# Patient Record
Sex: Male | Born: 1945 | Hispanic: Refuse to answer | Marital: Married | Smoking: Former smoker
Health system: Southern US, Community
[De-identification: ages and names within clinical notes are randomized; demographics above are authoritative.]

## PROBLEM LIST (undated history)

## (undated) DIAGNOSIS — J069 Acute upper respiratory infection, unspecified: Secondary | ICD-10-CM

## (undated) DIAGNOSIS — Z860100 Personal history of colon polyps, unspecified: Secondary | ICD-10-CM

## (undated) DIAGNOSIS — I10 Essential (primary) hypertension: Secondary | ICD-10-CM

## (undated) DIAGNOSIS — M1990 Unspecified osteoarthritis, unspecified site: Secondary | ICD-10-CM

## (undated) DIAGNOSIS — E785 Hyperlipidemia, unspecified: Secondary | ICD-10-CM

## (undated) DIAGNOSIS — N200 Calculus of kidney: Secondary | ICD-10-CM

## (undated) DIAGNOSIS — Z87442 Personal history of urinary calculi: Secondary | ICD-10-CM

## (undated) DIAGNOSIS — A630 Anogenital (venereal) warts: Secondary | ICD-10-CM

## (undated) DIAGNOSIS — J309 Allergic rhinitis, unspecified: Secondary | ICD-10-CM

## (undated) HISTORY — PX: ADENOIDECTOMY: SUR15

## (undated) HISTORY — DX: Personal history of colonic polyps: Z86.010

## (undated) HISTORY — DX: Acute upper respiratory infection, unspecified: J06.9

## (undated) HISTORY — DX: Essential (primary) hypertension: I10

## (undated) HISTORY — PX: INGUINAL HERNIA REPAIR: SUR1180

## (undated) HISTORY — DX: Hyperlipidemia, unspecified: E78.5

## (undated) HISTORY — PX: TONSILLECTOMY: SUR1361

## (undated) HISTORY — DX: Anogenital (venereal) warts: A63.0

## (undated) HISTORY — DX: Personal history of colon polyps, unspecified: Z86.0100

---

## 1998-07-19 HISTORY — PX: UMBILICAL HERNIA REPAIR: SHX196

## 2000-07-19 HISTORY — PX: CHOLECYSTECTOMY: SHX55

## 2008-05-24 ENCOUNTER — Encounter: Payer: Self-pay | Admitting: Family Medicine

## 2008-11-01 ENCOUNTER — Ambulatory Visit: Payer: Self-pay | Admitting: Family Medicine

## 2008-11-01 DIAGNOSIS — H9190 Unspecified hearing loss, unspecified ear: Secondary | ICD-10-CM | POA: Insufficient documentation

## 2008-11-01 DIAGNOSIS — I10 Essential (primary) hypertension: Secondary | ICD-10-CM

## 2008-11-01 DIAGNOSIS — M719 Bursopathy, unspecified: Secondary | ICD-10-CM

## 2008-11-01 DIAGNOSIS — M67919 Unspecified disorder of synovium and tendon, unspecified shoulder: Secondary | ICD-10-CM | POA: Insufficient documentation

## 2008-11-01 DIAGNOSIS — H9319 Tinnitus, unspecified ear: Secondary | ICD-10-CM | POA: Insufficient documentation

## 2008-11-01 DIAGNOSIS — E785 Hyperlipidemia, unspecified: Secondary | ICD-10-CM

## 2008-11-05 ENCOUNTER — Encounter: Payer: Self-pay | Admitting: Family Medicine

## 2008-11-12 ENCOUNTER — Telehealth: Payer: Self-pay | Admitting: Family Medicine

## 2008-11-15 ENCOUNTER — Encounter: Payer: Self-pay | Admitting: Family Medicine

## 2009-03-19 ENCOUNTER — Ambulatory Visit: Payer: Self-pay | Admitting: Family Medicine

## 2009-03-19 DIAGNOSIS — Z9849 Cataract extraction status, unspecified eye: Secondary | ICD-10-CM | POA: Insufficient documentation

## 2009-03-19 DIAGNOSIS — L570 Actinic keratosis: Secondary | ICD-10-CM | POA: Insufficient documentation

## 2009-03-26 ENCOUNTER — Encounter: Admission: RE | Admit: 2009-03-26 | Discharge: 2009-04-21 | Payer: Self-pay | Admitting: Family Medicine

## 2009-04-18 ENCOUNTER — Encounter: Payer: Self-pay | Admitting: Family Medicine

## 2009-06-04 ENCOUNTER — Ambulatory Visit: Payer: Self-pay | Admitting: Family Medicine

## 2009-06-06 ENCOUNTER — Telehealth: Payer: Self-pay | Admitting: Family Medicine

## 2009-06-18 ENCOUNTER — Telehealth: Payer: Self-pay | Admitting: Family Medicine

## 2009-06-23 ENCOUNTER — Ambulatory Visit: Payer: Self-pay | Admitting: Family Medicine

## 2009-06-23 DIAGNOSIS — M545 Low back pain: Secondary | ICD-10-CM

## 2009-06-23 LAB — CONVERTED CEMR LAB
Bilirubin Urine: NEGATIVE
Glucose, Urine, Semiquant: NEGATIVE
Ketones, urine, test strip: NEGATIVE
Nitrite: NEGATIVE
Protein, U semiquant: NEGATIVE
Specific Gravity, Urine: 1.01
Urobilinogen, UA: 0.2
WBC Urine, dipstick: NEGATIVE
pH: 6.5

## 2009-08-26 ENCOUNTER — Ambulatory Visit: Payer: Self-pay | Admitting: Family Medicine

## 2009-08-28 LAB — CONVERTED CEMR LAB
ALT: 21 units/L (ref 0–53)
AST: 23 units/L (ref 0–37)
Albumin: 3.7 g/dL (ref 3.5–5.2)
Alkaline Phosphatase: 67 units/L (ref 39–117)
Bilirubin, Direct: 0 mg/dL (ref 0.0–0.3)
Cholesterol: 121 mg/dL (ref 0–200)
HDL: 39.8 mg/dL (ref >39.00)
LDL Cholesterol: 62 mg/dL (ref 0–99)
Total Bilirubin: 0.6 mg/dL (ref 0.3–1.2)
Total CHOL/HDL Ratio: 3
Total Protein: 6.4 g/dL (ref 6.0–8.3)
Triglycerides: 96 mg/dL (ref 0.0–149.0)
VLDL: 19.2 mg/dL (ref 0.0–40.0)

## 2009-10-03 ENCOUNTER — Ambulatory Visit: Payer: Self-pay | Admitting: Family Medicine

## 2009-10-03 LAB — CONVERTED CEMR LAB
ALT: 18 units/L (ref 0–53)
AST: 24 units/L (ref 0–37)
Albumin: 3.8 g/dL (ref 3.5–5.2)
Alkaline Phosphatase: 65 units/L (ref 39–117)
BUN: 15 mg/dL (ref 6–23)
Basophils Absolute: 0 10*3/uL (ref 0.0–0.1)
Basophils Relative: 0.8 % (ref 0.0–3.0)
Bilirubin Urine: NEGATIVE
Bilirubin, Direct: 0 mg/dL (ref 0.0–0.3)
CO2: 29 meq/L (ref 19–32)
Calcium: 8.7 mg/dL (ref 8.4–10.5)
Chloride: 110 meq/L (ref 96–112)
Cholesterol: 132 mg/dL (ref 0–200)
Creatinine, Ser: 0.9 mg/dL (ref 0.4–1.5)
Eosinophils Absolute: 0.1 10*3/uL (ref 0.0–0.7)
Eosinophils Relative: 2 % (ref 0.0–5.0)
GFR calc non Af Amer: 90.11 mL/min (ref >60)
Glucose, Bld: 86 mg/dL (ref 70–99)
Glucose, Urine, Semiquant: NEGATIVE
HCT: 41.5 % (ref 39.0–52.0)
HDL: 45.5 mg/dL (ref >39.00)
Hemoglobin: 13.6 g/dL (ref 13.0–17.0)
Ketones, urine, test strip: NEGATIVE
LDL Cholesterol: 69 mg/dL (ref 0–99)
Lymphocytes Relative: 29.3 % (ref 12.0–46.0)
Lymphs Abs: 1.2 10*3/uL (ref 0.7–4.0)
MCHC: 32.8 g/dL (ref 30.0–36.0)
MCV: 99.9 fL (ref 78.0–100.0)
Monocytes Absolute: 0.4 10*3/uL (ref 0.1–1.0)
Monocytes Relative: 8.6 % (ref 3.0–12.0)
Neutro Abs: 2.5 10*3/uL (ref 1.4–7.7)
Neutrophils Relative %: 59.3 % (ref 43.0–77.0)
Nitrite: NEGATIVE
PSA: 1.67 ng/mL (ref 0.10–4.00)
Platelets: 176 10*3/uL (ref 150.0–400.0)
Potassium: 4.2 meq/L (ref 3.5–5.1)
RBC: 4.15 M/uL — ABNORMAL LOW (ref 4.22–5.81)
RDW: 12.3 % (ref 11.5–14.6)
Sodium: 143 meq/L (ref 135–145)
Specific Gravity, Urine: >=1.03
TSH: 1.72 microintl units/mL (ref 0.35–5.50)
Total Bilirubin: 0.4 mg/dL (ref 0.3–1.2)
Total CHOL/HDL Ratio: 3
Total Protein: 6.6 g/dL (ref 6.0–8.3)
Triglycerides: 86 mg/dL (ref 0.0–149.0)
Urobilinogen, UA: 0.2
VLDL: 17.2 mg/dL (ref 0.0–40.0)
WBC Urine, dipstick: NEGATIVE
WBC: 4.2 10*3/uL — ABNORMAL LOW (ref 4.5–10.5)
pH: 5.5

## 2009-10-10 ENCOUNTER — Ambulatory Visit: Payer: Self-pay | Admitting: Family Medicine

## 2009-10-10 LAB — CONVERTED CEMR LAB
Bilirubin Urine: NEGATIVE
Glucose, Urine, Semiquant: NEGATIVE
Ketones, urine, test strip: NEGATIVE
Nitrite: NEGATIVE
Protein, U semiquant: NEGATIVE
Specific Gravity, Urine: 1.015
Urobilinogen, UA: 0.2
WBC Urine, dipstick: NEGATIVE
pH: 6.5

## 2009-10-20 ENCOUNTER — Telehealth: Payer: Self-pay | Admitting: Family Medicine

## 2009-12-22 ENCOUNTER — Telehealth: Payer: Self-pay | Admitting: Family Medicine

## 2010-01-12 ENCOUNTER — Ambulatory Visit: Payer: Self-pay | Admitting: Family Medicine

## 2010-01-12 DIAGNOSIS — M7710 Lateral epicondylitis, unspecified elbow: Secondary | ICD-10-CM | POA: Insufficient documentation

## 2010-04-09 ENCOUNTER — Ambulatory Visit: Payer: Self-pay | Admitting: Family Medicine

## 2010-04-10 LAB — CONVERTED CEMR LAB
ALT: 21 units/L (ref 0–53)
AST: 28 units/L (ref 0–37)
Albumin: 3.9 g/dL (ref 3.5–5.2)
Alkaline Phosphatase: 62 units/L (ref 39–117)
Bilirubin, Direct: 0.1 mg/dL (ref 0.0–0.3)
Cholesterol: 161 mg/dL (ref 0–200)
HDL: 38.1 mg/dL — ABNORMAL LOW (ref >39.00)
LDL Cholesterol: 107 mg/dL — ABNORMAL HIGH (ref 0–99)
Total Bilirubin: 0.5 mg/dL (ref 0.3–1.2)
Total CHOL/HDL Ratio: 4
Total Protein: 6.3 g/dL (ref 6.0–8.3)
Triglycerides: 80 mg/dL (ref 0.0–149.0)
VLDL: 16 mg/dL (ref 0.0–40.0)

## 2010-04-16 ENCOUNTER — Ambulatory Visit: Payer: Self-pay | Admitting: Family Medicine

## 2010-07-28 ENCOUNTER — Other Ambulatory Visit: Payer: Self-pay | Admitting: Family Medicine

## 2010-07-28 ENCOUNTER — Ambulatory Visit
Admission: RE | Admit: 2010-07-28 | Discharge: 2010-07-28 | Payer: Self-pay | Source: Home / Self Care | Attending: Family Medicine | Admitting: Family Medicine

## 2010-07-28 DIAGNOSIS — K5900 Constipation, unspecified: Secondary | ICD-10-CM | POA: Insufficient documentation

## 2010-07-28 LAB — TSH: TSH: 1.52 u[IU]/mL (ref 0.35–5.50)

## 2010-07-28 LAB — CBC WITH DIFFERENTIAL/PLATELET
Basophils Absolute: 0 10*3/uL (ref 0.0–0.1)
Basophils Relative: 0.6 % (ref 0.0–3.0)
Eosinophils Absolute: 0.1 10*3/uL (ref 0.0–0.7)
Eosinophils Relative: 1.4 % (ref 0.0–5.0)
HCT: 41.6 % (ref 39.0–52.0)
Hemoglobin: 14.3 g/dL (ref 13.0–17.0)
Lymphocytes Relative: 27.4 % (ref 12.0–46.0)
Lymphs Abs: 1.2 10*3/uL (ref 0.7–4.0)
MCHC: 34.4 g/dL (ref 30.0–36.0)
MCV: 97.4 fl (ref 78.0–100.0)
Monocytes Absolute: 0.3 10*3/uL (ref 0.1–1.0)
Monocytes Relative: 8.1 % (ref 3.0–12.0)
Neutro Abs: 2.6 10*3/uL (ref 1.4–7.7)
Neutrophils Relative %: 62.5 % (ref 43.0–77.0)
Platelets: 207 10*3/uL (ref 150.0–400.0)
RBC: 4.27 Mil/uL (ref 4.22–5.81)
RDW: 13.3 % (ref 11.5–14.6)
WBC: 4.2 10*3/uL — ABNORMAL LOW (ref 4.5–10.5)

## 2010-07-28 LAB — CONVERTED CEMR LAB
HDL goal, serum: 40 mg/dL
LDL Goal: 100 mg/dL

## 2010-08-18 NOTE — Progress Notes (Signed)
Summary: REQ FOR REFERRAL DOCUMENT  Phone Note Call from Patient   Caller: Patient Reason for Call: Talk to Nurse, Talk to Doctor Summary of Call: Pt called to adv that he has an appt with Dr Stephannie Li with [facility] this afternoon.... However, pt is stating that his insurance Administrator) requires him to have a referral from his PCP... Pt adv that he needs a referral for his OV with Dr Allyne Gee.... Pt can be reached at [phone] when same is ready.  Initial call taken by: Debbra Riding,  December 22, 2009 11:49 AM  Follow-up for Phone Call        OK to refer. Follow-up by: Evelena Peat MD,  December 22, 2009 1:19 PM  Additional Follow-up for Phone Call Additional follow up Details #1::        Faxed order and copy of [employer] Referral Form to Dr. Allyne Gee. Faxed Referral Form to [employer]. Pt aware. Additional Follow-up by: Corky Mull,  December 22, 2009 1:58 PM

## 2010-08-18 NOTE — Progress Notes (Signed)
Summary: Pt needs to get a referral to Opthamologist - Dr. Hazle Quant  Phone Note Call from Patient Call back at [phone] cell   Caller: Patient Summary of Call: Pt called and has checked with his insurance policy and was told that he would need to get a referral to Opthamologist - Dr. Hazle Quant of [facility]. Please give him a referral.       Initial call taken by: Lucy Antigua,  October 20, 2009 3:27 PM  Follow-up for Phone Call        OK to refer Follow-up by: Evelena Peat MD,  October 20, 2009 9:55 PM

## 2010-08-18 NOTE — Assessment & Plan Note (Signed)
Summary: 3 MONTH ROV/NJR   Vital Signs:  Patient profile:   65 year old male Weight:      230 pounds Temp:     97.8 degrees F oral BP sitting:   142 / 92  (left arm) Cuff size:   large  Vitals Entered By: Sid Falcon LPN (April 16, 2010 8:42 AM)  Serial Vital Signs/Assessments:  Time      Position  BP       Pulse  Resp  Temp     By                     140/88                         Evelena Peat MD   History of Present Illness: Patient here for followup hyperlipidemia. Also history of elevated blood pressure currently off medication. He started exercising regularly lost about 5 pounds over the past month. Blood pressures have improved about 10 points systolic and diastolic. He is exercising 5 days per week. Overall feels better.  Takes pravastatin 40 mg daily for hyperlipidemia. Recent lipids controlled but low HDL. No chest pains or dizziness.  The patient needs flu vaccine also no prior history of Pneumovax  Allergies (verified): No Known Drug Allergies  Past History:  Past Medical History: Last updated: 11/01/2008 Hayfever/allergies genital warts polyps in colon Hyperlipidemia Hypertension PMH reviewed for relevance  Physical Exam  General:  Well-developed,well-nourished,in no acute distress; alert,appropriate and cooperative throughout examination Neck:  No deformities, masses, or tenderness noted. Lungs:  Normal respiratory effort, chest expands symmetrically. Lungs are clear to auscultation, no crackles or wheezes. Heart:  Normal rate and regular rhythm. S1 and S2 normal without gallop, murmur, click, rub or other extra sounds.   Impression & Recommendations:  Problem # 1:  HYPERTENSION (ICD-401.9) marginal control.  cont weight loss efforts and reassess 3 months.  Problem # 2:  HYPERLIPIDEMIA (ICD-272.4)  His updated medication list for this problem includes:    Pravastatin Sodium 40 Mg Tabs (Pravastatin sodium) ..... One by mouth q hs  Problem  # 3:  Preventive Health Care (ICD-V70.0) flu and pvx given.  Complete Medication List: 1)  Aspirin Adult Low Strength 81 Mg Tbec (Aspirin) .... Once daily 2)  Pravastatin Sodium 40 Mg Tabs (Pravastatin sodium) .... One by mouth q hs 3)  Multi Vitamin Mens Tabs (Multiple vitamin) .... Once daily  Other Orders: Flu Vaccine 55yrs + (09604) Admin 1st Vaccine (54098) Pneumococcal Vaccine (11914) Admin of Any Addtl Vaccine (78295)  Patient Instructions: 1)  Schedule physical examination in approximately 6 months 2)  Check your  Blood Pressure regularly . If it is above: 140/90  you should make an appointment. 3)  It is important that you exercise reguarly at least 20 minutes 5 times a week. If you develop chest pain, have severe difficulty breathing, or feel very tired, stop exercising immediately and seek medical attention.    Immunizations Administered:  Influenza Vaccine # 1:    Vaccine Type: Fluvax 3+    Site: left deltoid    Mfr: GlaxoSmithKline    Dose: 0.5 ml    Route: IM    Given by: Sid Falcon LPN    Exp. Date: 01/16/2011    Lot #: [id number]    VIS given: 02/10/10 version given April 16, 2010.  Pneumonia Vaccine:    Vaccine Type: Pneumovax    Site: right deltoid  Mfr: Merck    Dose: 0.5 ml    Route: IM    Given by: Sid Falcon LPN    Exp. Date: 10/05/2011    Lot #: [id number]    VIS given: 06/23/09 version given April 16, 2010.  Flu Vaccine Consent Questions:    Do you have a history of severe allergic reactions to this vaccine? no    Any prior history of allergic reactions to egg and/or gelatin? no    Do you have a sensitivity to the preservative Thimersol? no    Do you have a past history of Guillan-Barre Syndrome? no    Do you currently have an acute febrile illness? no    Have you ever had a severe reaction to latex? no    Vaccine information given and explained to patient? yes

## 2010-08-18 NOTE — Assessment & Plan Note (Signed)
Summary: cpx/cjr   Vital Signs:  Patient profile:   65 year old male Height:      71.5 inches Weight:      241 pounds BMI:     33.26 Temp:     98.2 degrees F oral BP sitting:   140 / 94  (left arm) Cuff size:   large  Vitals Entered By: Sid Falcon LPN (October 10, 2009 8:59 AM)  Nutrition Counseling: Patient's BMI is greater than 25 and therefore counseled on weight management options. CC: CPX, labs done   History of Present Illness: Here for CPE.  Colonscsopy in 2008 due for repeat in 2013.  Tdap 2009.  Exercising regularly.  Hx hyperlipidemia  PMH, SH, AND FH reviewed and no signif changes.  Preventive Screening-Counseling & Management  Caffeine-Diet-Exercise     Does Patient Exercise: yes  Allergies (verified): No Known Drug Allergies  Past History:  Past Medical History: Last updated: 11/01/2008 Hayfever/allergies genital warts polyps in colon Hyperlipidemia Hypertension  Past Surgical History: Last updated: 11/01/2008 Hernia surgery 3 times, naval 2000, rt inguinal 2003, lt inguinal 2004  Family History: Last updated: 10/10/2009 Family History of Arthritis Family History High cholesterol Family History Hypertension breast cancer sister prostate cancer, father  Social History: Last updated: 10/10/2009 Retired Married Alcohol use-no Regular exercise-yes  Risk Factors: Exercise: yes (10/10/2009) PMH-FH-SH reviewed for relevance  Family History: Family History of Arthritis Family History High cholesterol Family History Hypertension breast cancer sister prostate cancer, father  Social History: Retired Married Alcohol use-no Regular exercise-yes Does Patient Exercise:  yes  Review of Systems  The patient denies anorexia, fever, weight loss, weight gain, chest pain, syncope, dyspnea on exertion, peripheral edema, prolonged cough, headaches, hemoptysis, abdominal pain, melena, hematochezia, severe indigestion/heartburn, hematuria,  incontinence, muscle weakness, depression, and enlarged lymph nodes.         Occ  constipation  Physical Exam  General:  Well-developed,well-nourished,in no acute distress; alert,appropriate and cooperative throughout examination Head:  Normocephalic and atraumatic without obvious abnormalities. No apparent alopecia or balding. Eyes:  No corneal or conjunctival inflammation noted. EOMI. Perrla. Funduscopic exam benign, without hemorrhages, exudates or papilledema. Vision grossly normal. Ears:  External ear exam shows no significant lesions or deformities.  Otoscopic examination reveals clear canals, tympanic membranes are intact bilaterally without bulging, retraction, inflammation or discharge. Hearing is grossly normal bilaterally. Mouth:  Oral mucosa and oropharynx without lesions or exudates.  Teeth in good repair. Neck:  No deformities, masses, or tenderness noted. Lungs:  Normal respiratory effort, chest expands symmetrically. Lungs are clear to auscultation, no crackles or wheezes. Heart:  Normal rate and regular rhythm. S1 and S2 normal without gallop, murmur, click, rub or other extra sounds. Abdomen:  Bowel sounds positive,abdomen soft and non-tender without masses, organomegaly or hernias noted. Rectal:  No external abnormalities noted. Normal sphincter tone. No rectal masses or tenderness. Prostate:  Prostate gland firm and smooth, no enlargement, nodularity, tenderness, mass, asymmetry or induration. Msk:  No deformity or scoliosis noted of thoracic or lumbar spine.   Extremities:  No clubbing, cyanosis, edema, or deformity noted with normal full range of motion of all joints.   Neurologic:  alert & oriented X3 and cranial nerves II-XII intact.   Skin:  Intact without suspicious lesions or rashes Cervical Nodes:  No lymphadenopathy noted Psych:  normally interactive, good eye contact, not anxious appearing, and not depressed appearing.     Impression &  Recommendations:  Problem # 1:  Preventive Health Care (ICD-V70.0) cont regular  exercise .  Due for repeat colonoscopy in 2 years.  Consider Shingle vaccine and pt will look at insurance coverage.  Pvx by next year.  Complete Medication List: 1)  Aspirin Adult Low Strength 81 Mg Tbec (Aspirin) .... Once daily 2)  Crestor 10 Mg Tabs (Rosuvastatin calcium) .... One daily. 3)  Multi Vitamin Mens Tabs (Multiple vitamin) .... Once daily  Patient Instructions: 1)  It is important that you exercise reguarly at least 20 minutes 5 times a week. If you develop chest pain, have severe difficulty breathing, or feel very tired, stop exercising immediately and seek medical attention.  2)  You need to lose weight. Consider a lower calorie diet and regular exercise.  3)  Consider pneumonia vaccine by next year. 4)  Consider shingles vaccine this year. Prescriptions: CRESTOR 10 MG TABS (ROSUVASTATIN CALCIUM) One daily.  #30 x 11   Entered and Authorized by:   Evelena Peat MD   Signed by:   Evelena Peat MD on 10/10/2009   Method used:   Electronically to        [facility]  [place] [address] (retail)       [address]       [place], [place]  [address]       Ph: [phone] or [phone]       Fax: [phone]   RxID:   [id number]   Laboratory Results   Urine Tests    Routine Urinalysis   Color: yellow Appearance: Clear Glucose: negative   (Normal Range: Negative) Bilirubin: negative   (Normal Range: Negative) Ketone: negative   (Normal Range: Negative) Spec. Gravity: 1.015   (Normal Range: 1.003-1.035) Blood: trace-lysed   (Normal Range: Negative) pH: 6.5   (Normal Range: 5.0-8.0) Protein: negative   (Normal Range: Negative) Urobilinogen: 0.2   (Normal Range: 0-1) Nitrite: negative   (Normal Range: Negative) Leukocyte Esterace: negative   (Normal Range: Negative)    Comments: Rita Ohara  October 10, 2009 10:02 AM

## 2010-08-18 NOTE — Assessment & Plan Note (Signed)
Summary: elbow pain/njr   Vital Signs:  Patient profile:   65 year old male Temp:     98.0 degrees F oral BP sitting:   140 / 90  (left arm) Cuff size:   large  Vitals Entered By: Sid Falcon LPN (January 12, 2010 4:00 PM) CC: bil elbow pain   History of Present Illness: Bilateral lat elbow pain for 4-5 weeks after carrying bricks. Alleve no help.  Ice without much success.  Both equally bothersome. No shoulder or wrist pain.  Also requests generic statin.  myalgias with simvastatin at 20 mg. Lipids well controlled when checked in March.  No hx of CAD.  Allergies: No Known Drug Allergies  Past History:  Past Medical History: Last updated: 11/01/2008 Hayfever/allergies genital warts polyps in colon Hyperlipidemia Hypertension  Review of Systems  The patient denies chest pain, syncope, dyspnea on exertion, and peripheral edema.    Physical Exam  General:  Well-developed,well-nourished,in no acute distress; alert,appropriate and cooperative throughout examination Neck:  No deformities, masses, or tenderness noted. Lungs:  Normal respiratory effort, chest expands symmetrically. Lungs are clear to auscultation, no crackles or wheezes. Heart:  Normal rate and regular rhythm. S1 and S2 normal without gallop, murmur, click, rub or other extra sounds. Extremities:  tender over lat epicondylar region bilaterally.  Full ROM.  Pain lat elbow with extension of wrist against resistence.     Impression & Recommendations:  Problem # 1:  LATERAL EPICONDYLITIS (ICD-726.32) Assessment New  Failed conservative therapy.  Discussed risks and benefits of corticosteroid injection and pt consents.  Prepped both elbows over lat epicondylar region and injected 1 cc plain 1%xylocaine and 40 mg depmedrol into area of maximal tenderness .  pt tolerated well.  Orders: Injection, Tendon / Ligament (16109) Depo- Medrol 40mg  (J1030)  Problem # 2:  HYPERLIPIDEMIA (ICD-272.4) change to  Pravastatin 40 mg once daily and lipid recheck 3 months. His updated medication list for this problem includes:    Pravastatin Sodium 40 Mg Tabs (Pravastatin sodium) ..... One by mouth q hs  Complete Medication List: 1)  Aspirin Adult Low Strength 81 Mg Tbec (Aspirin) .... Once daily 2)  Pravastatin Sodium 40 Mg Tabs (Pravastatin sodium) .... One by mouth q hs 3)  Multi Vitamin Mens Tabs (Multiple vitamin) .... Once daily  Patient Instructions: 1)  Please schedule a follow-up appointment in 3 months .  2)  Hepatic Panel prior to visit ICD-9: 272.4 3)  Lipid panel prior to visit ICD-9 : 272.4 4)  Continue to ice elbows frequently 5)  Pennsaid 5 drops to affected area three times daily. Prescriptions: PRAVASTATIN SODIUM 40 MG TABS (PRAVASTATIN SODIUM) one by mouth q hs  #30 x 5   Entered and Authorized by:   Evelena Peat MD   Signed by:   Evelena Peat MD on 01/12/2010   Method used:   [facility]        [facility]  [place] [address] [facility]       [address]       [place], [place]  [address]       Ph: [phone] or [phone]       Fax: [phone]   RxID:   [id number]

## 2010-08-20 NOTE — Assessment & Plan Note (Signed)
Summary: back pain/njr   Vital Signs:  Patient profile:   65 year old male Weight:      225 pounds Temp:     98.6 degrees F oral BP sitting:   150 / 90  (left arm) Cuff size:   large  Vitals Entered By: Duard Brady LPN (July 28, 2010 10:59 AM)  History of Present Illness:       This is a 65 year old male who presents with Constipation.  Onset 2 months ago.  Colonoscopy 2008 normal.  TSH normal last March.  The patient reports hard stools, but denies abdominal pain, cramping, hematochezia, melena, nausea, and vomiting.  Associated symptoms include back pain.  The patient denies the following symptoms: fever and cold intolerance.  Effective treatments have included OTC laxatives.  Ineffective treatments have included dietary changes, fiber supplements, and fluids.   Back pain seems to resolve after BMs.  Also stools seem to be shaped somewhat different-thinner.  Exercising regularly and without difficulty.  Feels well overall.  Lipid Management History:      Positive NCEP/ATP III risk factors include male age 37 years old or older, HDL cholesterol less than 40, and hypertension.  Negative NCEP/ATP III risk factors include non-diabetic, non-tobacco-user status, no ASHD (atherosclerotic heart disease), no prior stroke/TIA, no peripheral vascular disease, and no history of aortic aneurysm.     Preventive Screening-Counseling & Management  Alcohol-Tobacco     Smoking Status: never  Clinical Review Panels:  Prevention   Last Colonoscopy:  normal (07/19/2006)   Last PSA:  1.67 (10/03/2009)   Allergies (verified): No Known Drug Allergies  Past History:  Past Medical History: Last updated: 11/01/2008 Hayfever/allergies genital warts polyps in colon Hyperlipidemia Hypertension  Past Surgical History: Last updated: 11/01/2008 Hernia surgery 3 times, naval 2000, rt inguinal 2003, lt inguinal 2004  Family History: Last updated: 10/10/2009 Family History of  Arthritis Family History High cholesterol Family History Hypertension breast cancer sister prostate cancer, father  Social History: Last updated: 10/10/2009 Retired Married Alcohol use-no Regular exercise-yes  Risk Factors: Exercise: yes (10/10/2009) PMH-FH-SH reviewed for relevance  Social History: Smoking Status:  never  Review of Systems  The patient denies anorexia, fever, weight gain, chest pain, peripheral edema, prolonged cough, headaches, hemoptysis, abdominal pain, melena, hematochezia, and severe indigestion/heartburn.         has lost some weight due to his efforts.  Physical Exam  General:  Well-developed,well-nourished,in no acute distress; alert,appropriate and cooperative throughout examination Head:  Normocephalic and atraumatic without obvious abnormalities. No apparent alopecia or balding. Eyes:  pupils equal, pupils round, and pupils reactive to light.   Ears:  External ear exam shows no significant lesions or deformities.  Otoscopic examination reveals clear canals, tympanic membranes are intact bilaterally without bulging, retraction, inflammation or discharge. Hearing is grossly normal bilaterally. Mouth:  Oral mucosa and oropharynx without lesions or exudates.  Teeth in good repair. Neck:  No deformities, masses, or tenderness noted. Lungs:  Normal respiratory effort, chest expands symmetrically. Lungs are clear to auscultation, no crackles or wheezes. Heart:  Normal rate and regular rhythm. S1 and S2 normal without gallop, murmur, click, rub or other extra sounds. Abdomen:  Bowel sounds positive,abdomen soft and non-tender without masses, organomegaly or hernias noted. Genitalia:  Testes bilaterally descended without nodularity, tenderness or masses. No scrotal masses or lesions. No penis lesions or urethral discharge no hernia.  Extremities:  No clubbing, cyanosis, edema, or deformity noted with normal full range of motion of all joints.  Impression & Recommendations:  Problem # 1:  CONSTIPATION (ICD-564.00) repeat TSH and discussed conserative measures.  Avoid freq use of laxatives. Orders: TLB-TSH (Thyroid Stimulating Hormone) (84443-TSH) TLB-CBC Platelet - w/Differential (85025-CBCD)  Problem # 2:  HYPERLIPIDEMIA (ICD-272.4)  His updated medication list for this problem includes:    Pravastatin Sodium 40 Mg Tabs (Pravastatin sodium) ..... One by mouth q hs  Complete Medication List: 1)  Aspirin Adult Low Strength 81 Mg Tbec (Aspirin) .... Once daily 2)  Pravastatin Sodium 40 Mg Tabs (Pravastatin sodium) .... One by mouth q hs 3)  Multi Vitamin Mens Tabs (Multiple vitamin) .... Once daily  Lipid Assessment/Plan:      Based on NCEP/ATP III, the patient's risk factor category is "2 or more risk factors and a calculated 10 year CAD risk of > 20%".  The patient's lipid goals are as follows: Total cholesterol goal is 200; LDL cholesterol goal is 100; HDL cholesterol goal is 40; Triglyceride goal is 150.    Patient Instructions: 1)  Drink lots of fluids 2)  Fiber at least 25 grams per day. 3)  Continue regular exercise. 4)  Avoid regular use of laxatives. 5)  Stool softener such as colace or senokot S daily.   Orders Added: 1)  TLB-TSH (Thyroid Stimulating Hormone) [84443-TSH] 2)  TLB-CBC Platelet - w/Differential [85025-CBCD] 3)  Est. Patient Level IV [54098]

## 2010-10-08 ENCOUNTER — Encounter: Payer: Self-pay | Admitting: Family Medicine

## 2010-10-12 ENCOUNTER — Encounter: Payer: Self-pay | Admitting: Family Medicine

## 2010-10-12 ENCOUNTER — Ambulatory Visit (INDEPENDENT_AMBULATORY_CARE_PROVIDER_SITE_OTHER): Payer: Medicare Other | Admitting: Family Medicine

## 2010-10-12 DIAGNOSIS — N429 Disorder of prostate, unspecified: Secondary | ICD-10-CM

## 2010-10-12 DIAGNOSIS — E785 Hyperlipidemia, unspecified: Secondary | ICD-10-CM

## 2010-10-12 DIAGNOSIS — I10 Essential (primary) hypertension: Secondary | ICD-10-CM

## 2010-10-12 DIAGNOSIS — Z0000 Encounter for general adult medical examination without abnormal findings: Secondary | ICD-10-CM

## 2010-10-12 LAB — LIPID PANEL: HDL: 46 mg/dL (ref >39.00)

## 2010-10-12 LAB — PSA: PSA: 1.74 ng/mL (ref 0.10–4.00)

## 2010-10-12 NOTE — Progress Notes (Signed)
Quick Note:  Pt informed ______ 

## 2010-10-12 NOTE — Progress Notes (Signed)
Subjective:    Patient ID: Lee Smith, male    DOB: [birth year], 65 y.o.   MRN: [id number]  HPI  Patient here for annual Medicare wellness exam and reevaluation of hyperlipidemia.   Positive lifestyle changes since last visit and his exercise he has lost some weight. He took himself off Pravachol couple months ago. No intolerance to medication. No chest pain. No history of peripheral vascular disease or CAD.   1.  Risk factors based on Past Medical , Social, and Family history  PMH signif for hyperlipidemia.  Prior hx of elevated blood pressure but currently not treated. 2.  Limitations in physical activities  None.  Low risk for falls. 3.  Depression/mood  No depression or anxiety symptoms currently. 4.  Hearing  No deficits. 5.  ADLs  Fully independent in all. 6.  Cognitive function (orientation to time and place, language, writing, speech,memory)  No short or long term memory deficits.  Judgement intact.  Normal mood.   7.  Home Safety  No issues identified. 8.  Height, weight, and visual acuity.   Some weight loss due to his efforts.  No visual changes. 9.  Counseling  Tetanus up to date and prior PVX.  No hx of shingles vaccine but wife just finished chemo and he wishes to wait on that. 10. Recommendation of preventive services.  Eventually needs shingles vaccine but defer as above.  Colonoscopy will be due soon. 11. Labs based on risk factors  Lipid panel and PSA. 12. Care Plan  Labs as above.  He is instructed to continue with exercise and weigh loss efforts. 13.  Advanced Directives  None decided at this time.    Review of Systems  Constitutional: Negative for fever, activity change, appetite change and fatigue.  HENT: Negative for ear pain, congestion and trouble swallowing.   Eyes: Negative for pain and visual disturbance.  Respiratory: Negative for cough, shortness of breath and wheezing.   Cardiovascular: Negative for chest pain and palpitations.  Gastrointestinal: Negative  for nausea, vomiting, abdominal pain, diarrhea, constipation, blood in stool, abdominal distention and rectal pain.  Genitourinary: Negative for dysuria, hematuria and testicular pain.  Musculoskeletal: Negative for joint swelling and arthralgias.  Skin: Negative for rash.  Neurological: Negative for dizziness, syncope and headaches.  Hematological: Negative for adenopathy.  Psychiatric/Behavioral: Negative for confusion and dysphoric mood.       Objective:   Physical Exam  Constitutional: He is oriented to person, place, and time. He appears well-developed and well-nourished. No distress.  HENT:  Head: Normocephalic and atraumatic.  Right Ear: External ear normal.  Left Ear: External ear normal.  Mouth/Throat: Oropharynx is clear and moist.  Eyes: Conjunctivae and EOM are normal. Pupils are equal, round, and reactive to light.  Neck: Normal range of motion. Neck supple. No thyromegaly present.  Cardiovascular: Normal rate, regular rhythm and normal heart sounds.   No murmur heard. Pulmonary/Chest: No respiratory distress. He has no wheezes. He has no rales.  Abdominal: Soft. Bowel sounds are normal. He exhibits no distension and no mass. There is no tenderness. There is no rebound and no guarding.  Genitourinary: Rectum normal and prostate normal.  Musculoskeletal: He exhibits no edema.  Lymphadenopathy:    He has no cervical adenopathy.  Neurological: He is alert and oriented to person, place, and time. He displays normal reflexes. No cranial nerve deficit.  Skin: No rash noted.  Psychiatric: He has a normal mood and affect.  Assessment & Plan:   #1 health maintenance exam. Tetanus up to date. Pneumovax last fall. Discussed shingles vaccine his wife just finished chemotherapy he wishes to wait. Due for repeat colonoscopy later this year and he will contact gastroenterologist. Check labs today with PSA and lipid panel  #2 history of hyperlipidemia. Reassess lipid  panel

## 2011-06-08 ENCOUNTER — Ambulatory Visit (INDEPENDENT_AMBULATORY_CARE_PROVIDER_SITE_OTHER): Payer: Medicare Other | Admitting: Family Medicine

## 2011-06-08 ENCOUNTER — Encounter: Payer: Self-pay | Admitting: Family Medicine

## 2011-06-08 DIAGNOSIS — R1310 Dysphagia, unspecified: Secondary | ICD-10-CM

## 2011-06-08 DIAGNOSIS — Z23 Encounter for immunization: Secondary | ICD-10-CM

## 2011-06-08 DIAGNOSIS — B078 Other viral warts: Secondary | ICD-10-CM

## 2011-06-08 NOTE — Patient Instructions (Signed)
We will call you regarding gastroenterology referral.

## 2011-06-08 NOTE — Progress Notes (Signed)
  Subjective:    Patient ID: Lee Smith, male    DOB: 31-Mar-1946, 65 y.o.   MRN: [id number]  HPI  Patient seen the following issues  Progressive dysphagia over approximately 6 months. He had some mild issues in the past but recently has had few episodes of needing to regurgitate food and unable to finish meal. He has not had any unintentional weight loss or appetite changes. Dysphagia is to solids only and not to liquids. He denies previous history of EGD. Has had previous colonoscopy. Denies any vomiting or nausea.  No recent active GERD symptoms.  Remote history of smoking several years ago.  Warty lesion left lateral leg. First noted several months ago. Possibly slowly growing in size.  Requesting treatment.  Has not tried anything topically.  Past Medical History  Diagnosis Date  . Allergy   . Hypertension   . Hyperlipidemia   . Genital warts   . Hx of colonic polyps    Past Surgical History  Procedure Date  . Hernia repair     reports that he quit smoking about 40 years ago. His smoking use included Cigarettes. He has a 6 pack-year smoking history. He does not have any smokeless tobacco history on file. He reports that he does not drink alcohol. His drug history not on file. family history includes Arthritis in an unspecified family member; Cancer in his father and sister; Hyperlipidemia in an unspecified family member; and Hypertension in an unspecified family member. No Known Allergies    Review of Systems  Constitutional: Negative for fever, chills, appetite change and unexpected weight change.  HENT: Positive for trouble swallowing. Negative for sore throat and voice change.   Respiratory: Negative for cough and shortness of breath.   Cardiovascular: Negative for chest pain.  Gastrointestinal: Negative for nausea, vomiting, abdominal pain, diarrhea, constipation, blood in stool and abdominal distention.       Positive dysphagia as per history of present illness    Musculoskeletal: Negative for back pain.  Hematological: Negative for adenopathy.       Objective:   Physical Exam  Constitutional: He appears well-developed and well-nourished. No distress.  HENT:  Mouth/Throat: Oropharynx is clear and moist.  Neck: Neck supple. No thyromegaly present.  Cardiovascular: Normal rate, regular rhythm and normal heart sounds.   No murmur heard. Pulmonary/Chest: Effort normal and breath sounds normal. No respiratory distress. He has no wheezes. He has no rales.  Abdominal: Soft. He exhibits no distension and no mass. There is no tenderness. There is no rebound and no guarding.  Lymphadenopathy:    He has no cervical adenopathy.  Skin: No rash noted.       Common wart left lateral leg approximately 6 mm diameter          Assessment & Plan:  #1 progressive dysphagia to solid foods. GI referral for further evaluation #2 common wart left leg. Discussed risk and benefits of treatment with liquid nitrogen and patient wishes to proceed.  Treated without difficulty. Touch base 2 weeks if not resolving.

## 2011-07-01 ENCOUNTER — Other Ambulatory Visit (HOSPITAL_COMMUNITY): Payer: Self-pay | Admitting: Gastroenterology

## 2011-07-01 DIAGNOSIS — R4702 Dysphasia: Secondary | ICD-10-CM

## 2011-07-23 ENCOUNTER — Ambulatory Visit (HOSPITAL_COMMUNITY)
Admission: RE | Admit: 2011-07-23 | Discharge: 2011-07-23 | Disposition: A | Discharge status: Home / Self Care | Payer: Medicare Other | Source: Ambulatory Visit | Attending: Gastroenterology | Admitting: Gastroenterology

## 2011-07-23 DIAGNOSIS — R4702 Dysphasia: Secondary | ICD-10-CM

## 2011-07-23 DIAGNOSIS — R1310 Dysphagia, unspecified: Secondary | ICD-10-CM | POA: Diagnosis not present

## 2011-08-05 DIAGNOSIS — R1310 Dysphagia, unspecified: Secondary | ICD-10-CM | POA: Diagnosis not present

## 2011-12-14 ENCOUNTER — Ambulatory Visit (INDEPENDENT_AMBULATORY_CARE_PROVIDER_SITE_OTHER): Payer: Medicare Other | Admitting: Family Medicine

## 2011-12-14 ENCOUNTER — Encounter: Payer: Self-pay | Admitting: Family Medicine

## 2011-12-14 VITALS — BP 112/72 | HR 70 | Temp 98.1°F | Resp 18 | Wt 212.0 lb

## 2011-12-14 DIAGNOSIS — M25569 Pain in unspecified knee: Secondary | ICD-10-CM | POA: Diagnosis not present

## 2011-12-14 DIAGNOSIS — M25562 Pain in left knee: Secondary | ICD-10-CM

## 2011-12-14 NOTE — Patient Instructions (Signed)
We will call you with orthopedic referral. 

## 2011-12-14 NOTE — Progress Notes (Signed)
  Subjective:    Patient ID: Lee Smith, male    DOB: 10/05/1945, 66 y.o.   MRN: [id number]  HPI  Left knee pain. Intermittent for 8 to 9 years. About 8 years ago [place] and reportedly had MRI scan with a" small meniscus tear". Patient received cortisone injection which did help somewhat temporarily. No history of any knee surgery. Pain off and on for 8-9 years. Recently has been running more (training for a 5K) and increasing running speeds which may have aggravated. He ices after running and is using Aleve without much relief. Location is medial knee. Minimal if any edema.  No locking or giving way.  No specific injury. Worse with stairs. Worse with squatting. He is able to use eliptical for exercise.  Past Medical History  Diagnosis Date  . Allergy   . Hypertension   . Hyperlipidemia   . Genital warts   . Hx of colonic polyps    Past Surgical History  Procedure Date  . Hernia repair     reports that he quit smoking about 41 years ago. His smoking use included Cigarettes. He has a 6 pack-year smoking history. He has quit using smokeless tobacco. His smokeless tobacco use included Chew. He reports that he does not drink alcohol. His drug history not on file. family history includes Arthritis in an unspecified family member; Cancer in his father and sister; Hyperlipidemia in an unspecified family member; and Hypertension in an unspecified family member. No Known Allergies      Review of Systems  Constitutional: Negative for appetite change and unexpected weight change.  Musculoskeletal: Negative for gait problem.       Objective:   Physical Exam  Constitutional: He appears well-developed and well-nourished.  Cardiovascular: Normal rate and regular rhythm.   Pulmonary/Chest: Effort normal and breath sounds normal. No respiratory distress. He has no wheezes. He has no rales.  Musculoskeletal:       Left knee reveals full range of motion. No warmth or  erythema. No ecchymosis. No effusion. Mild medial joint line tenderness. Ligament testing is normal. No lateral tenderness.          Assessment & Plan:  Chronic intermittent left medial knee pain. Question meniscal. Not relieved with anti-inflammatories or icing. Given duration of symptoms, orthopedic referral.

## 2011-12-23 DIAGNOSIS — M25569 Pain in unspecified knee: Secondary | ICD-10-CM | POA: Diagnosis not present

## 2011-12-23 DIAGNOSIS — R269 Unspecified abnormalities of gait and mobility: Secondary | ICD-10-CM | POA: Diagnosis not present

## 2011-12-23 DIAGNOSIS — IMO0002 Reserved for concepts with insufficient information to code with codable children: Secondary | ICD-10-CM | POA: Diagnosis not present

## 2011-12-27 DIAGNOSIS — Z86010 Personal history of colonic polyps: Secondary | ICD-10-CM | POA: Diagnosis not present

## 2011-12-27 DIAGNOSIS — D126 Benign neoplasm of colon, unspecified: Secondary | ICD-10-CM | POA: Diagnosis not present

## 2011-12-27 DIAGNOSIS — Z8371 Family history of colonic polyps: Secondary | ICD-10-CM | POA: Diagnosis not present

## 2011-12-27 DIAGNOSIS — K648 Other hemorrhoids: Secondary | ICD-10-CM | POA: Diagnosis not present

## 2011-12-27 DIAGNOSIS — Z09 Encounter for follow-up examination after completed treatment for conditions other than malignant neoplasm: Secondary | ICD-10-CM | POA: Diagnosis not present

## 2012-01-06 DIAGNOSIS — D1801 Hemangioma of skin and subcutaneous tissue: Secondary | ICD-10-CM | POA: Diagnosis not present

## 2012-01-06 DIAGNOSIS — D239 Other benign neoplasm of skin, unspecified: Secondary | ICD-10-CM | POA: Diagnosis not present

## 2012-01-10 ENCOUNTER — Encounter: Payer: Self-pay | Admitting: Family Medicine

## 2012-02-29 DIAGNOSIS — IMO0002 Reserved for concepts with insufficient information to code with codable children: Secondary | ICD-10-CM | POA: Diagnosis not present

## 2012-03-09 DIAGNOSIS — M1710 Unilateral primary osteoarthritis, unspecified knee: Secondary | ICD-10-CM | POA: Diagnosis not present

## 2012-04-19 ENCOUNTER — Ambulatory Visit (INDEPENDENT_AMBULATORY_CARE_PROVIDER_SITE_OTHER): Payer: Medicare Other | Admitting: Family Medicine

## 2012-04-19 DIAGNOSIS — Z23 Encounter for immunization: Secondary | ICD-10-CM

## 2012-09-21 DIAGNOSIS — M1710 Unilateral primary osteoarthritis, unspecified knee: Secondary | ICD-10-CM | POA: Diagnosis not present

## 2012-10-05 ENCOUNTER — Telehealth: Payer: Self-pay | Admitting: *Deleted

## 2012-10-05 NOTE — Telephone Encounter (Signed)
Pt is sch for 10-11-12

## 2012-10-05 NOTE — Telephone Encounter (Signed)
Surgery clearance for received for left knee surgery.  Pt needs OV for clearance.  Please call pt to schedule and thank you.

## 2012-10-10 ENCOUNTER — Ambulatory Visit (INDEPENDENT_AMBULATORY_CARE_PROVIDER_SITE_OTHER): Payer: Medicare Other | Admitting: Family Medicine

## 2012-10-10 ENCOUNTER — Encounter: Payer: Self-pay | Admitting: Family Medicine

## 2012-10-10 VITALS — BP 138/90 | Temp 98.6°F | Wt 230.0 lb

## 2012-10-10 DIAGNOSIS — I10 Essential (primary) hypertension: Secondary | ICD-10-CM | POA: Diagnosis not present

## 2012-10-10 DIAGNOSIS — I4510 Unspecified right bundle-branch block: Secondary | ICD-10-CM | POA: Diagnosis not present

## 2012-10-10 DIAGNOSIS — Z01818 Encounter for other preprocedural examination: Secondary | ICD-10-CM | POA: Diagnosis not present

## 2012-10-10 NOTE — Progress Notes (Signed)
  Subjective:    Patient ID: Lee Smith, male    DOB: Jul 27, 1945, 67 y.o.   MRN: [id number]  HPI Preop evaluation. Planned left knee arthroscopic surgery. Patient has history of borderline elevated blood pressure, mild hyperlipidemia and chronic medical problems. Takes no regular prescription medications. Exercises about 3 days per week and can sustain heart rate around 130-135 without difficulty. No recent chest pains. No dyspnea. No syncope. Past history of reported right bundle branch block which he states has been present for years No cardiac history.  Past Medical History  Diagnosis Date  . Allergy   . Hypertension   . Hyperlipidemia   . Genital warts   . Hx of colonic polyps    Past Surgical History  Procedure Laterality Date  . Hernia repair      reports that he quit smoking about 42 years ago. His smoking use included Cigarettes. He has a 6 pack-year smoking history. He has quit using smokeless tobacco. His smokeless tobacco use included Chew. He reports that he does not drink alcohol. His drug history is not on file. family history includes Arthritis in an unspecified family member; Cancer in his father and sister; Hyperlipidemia in an unspecified family member; and Hypertension in an unspecified family member. No Known Allergies   Review of Systems  Constitutional: Negative for fever, chills, appetite change and fatigue.  Eyes: Negative for visual disturbance.  Respiratory: Negative for cough, chest tightness and shortness of breath.   Cardiovascular: Negative for chest pain, palpitations and leg swelling.  Gastrointestinal: Negative for abdominal pain.  Neurological: Negative for dizziness, syncope, weakness, light-headedness and headaches.       Objective:   Physical Exam  Constitutional: He appears well-developed and well-nourished. No distress.  Neck: Neck supple. No thyromegaly present.  No carotid bruits  Cardiovascular: Normal rate and regular rhythm.  Exam  reveals no gallop.   Pulmonary/Chest: Effort normal and breath sounds normal. No respiratory distress. He has no wheezes. He has no rales.  Abdominal: Soft. Bowel sounds are normal. He exhibits no distension and no mass. There is no tenderness. There is no rebound and no guarding.  Musculoskeletal: He exhibits no edema.  Lymphadenopathy:    He has no cervical adenopathy.          Assessment & Plan:  Preoperative assessment. EKG shows sinus rhythm with right bundle branch block which apparently is old.  No cardiac history. Exercises vigorously 3 days per week without difficulty. No history of any respiratory concerns. No contraindications for surgery at this time.

## 2012-10-11 ENCOUNTER — Ambulatory Visit: Payer: Medicare Other | Admitting: Family Medicine

## 2012-10-13 DIAGNOSIS — S83289A Other tear of lateral meniscus, current injury, unspecified knee, initial encounter: Secondary | ICD-10-CM | POA: Diagnosis not present

## 2012-10-13 DIAGNOSIS — M23305 Other meniscus derangements, unspecified medial meniscus, unspecified knee: Secondary | ICD-10-CM | POA: Diagnosis not present

## 2012-10-13 DIAGNOSIS — IMO0002 Reserved for concepts with insufficient information to code with codable children: Secondary | ICD-10-CM | POA: Diagnosis not present

## 2012-10-13 DIAGNOSIS — M23302 Other meniscus derangements, unspecified lateral meniscus, unspecified knee: Secondary | ICD-10-CM | POA: Diagnosis not present

## 2012-10-13 DIAGNOSIS — M9420 Chondromalacia, unspecified site: Secondary | ICD-10-CM | POA: Diagnosis not present

## 2012-10-13 DIAGNOSIS — M2240 Chondromalacia patellae, unspecified knee: Secondary | ICD-10-CM | POA: Diagnosis not present

## 2012-10-18 ENCOUNTER — Ambulatory Visit: Payer: Medicare Other | Admitting: Family Medicine

## 2013-04-23 ENCOUNTER — Encounter: Payer: Self-pay | Admitting: Family Medicine

## 2013-04-23 ENCOUNTER — Ambulatory Visit (INDEPENDENT_AMBULATORY_CARE_PROVIDER_SITE_OTHER): Payer: Medicare Other | Admitting: Family Medicine

## 2013-04-23 VITALS — BP 138/90 | HR 71 | Temp 97.7°F | Wt 231.0 lb

## 2013-04-23 DIAGNOSIS — N320 Bladder-neck obstruction: Secondary | ICD-10-CM

## 2013-04-23 DIAGNOSIS — Z0000 Encounter for general adult medical examination without abnormal findings: Secondary | ICD-10-CM

## 2013-04-23 DIAGNOSIS — I10 Essential (primary) hypertension: Secondary | ICD-10-CM

## 2013-04-23 DIAGNOSIS — Z23 Encounter for immunization: Secondary | ICD-10-CM | POA: Diagnosis not present

## 2013-04-23 DIAGNOSIS — E669 Obesity, unspecified: Secondary | ICD-10-CM | POA: Insufficient documentation

## 2013-04-23 DIAGNOSIS — E785 Hyperlipidemia, unspecified: Secondary | ICD-10-CM

## 2013-04-23 LAB — BASIC METABOLIC PANEL
CO2: 27 mEq/L (ref 19–32)
Calcium: 8.8 mg/dL (ref 8.4–10.5)
Chloride: 105 mEq/L (ref 96–112)
Glucose, Bld: 95 mg/dL (ref 70–99)
Sodium: 137 mEq/L (ref 135–145)

## 2013-04-23 LAB — LIPID PANEL
HDL: 47.7 mg/dL (ref >39.00)
Total CHOL/HDL Ratio: 5
Triglycerides: 89 mg/dL (ref 0.0–149.0)
VLDL: 17.8 mg/dL (ref 0.0–40.0)

## 2013-04-23 LAB — HEPATIC FUNCTION PANEL
ALT: 27 U/L (ref 0–53)
Bilirubin, Direct: 0.1 mg/dL (ref 0.0–0.3)
Total Bilirubin: 0.7 mg/dL (ref 0.3–1.2)

## 2013-04-23 LAB — PSA: PSA: 2.73 ng/mL (ref 0.10–4.00)

## 2013-04-23 LAB — LDL CHOLESTEROL, DIRECT: Direct LDL: 166.7 mg/dL

## 2013-04-23 NOTE — Patient Instructions (Addendum)
Continue weight loss efforts. Continue regular aerobic exercise Monitor blood pressure and be in touch if consistently over 150/90 Check on insurance coverage for shingles vaccine. We were unable to confirm today

## 2013-04-23 NOTE — Progress Notes (Signed)
Subjective:    Patient ID: Lee Smith, male    DOB: 03-28-1946, 67 y.o.   MRN: [id number]  HPI Patient seen for Medicare wellness exam and medical followup Has history of borderline hypertension and hyperlipidemia. Never had shingles vaccine. Requesting flu vaccine. Other immunizations up-to-date. He has occasional nocturia but does drink considerable amount of tea at night He's had recent colonoscopy which was unremarkable. Smoked only briefly and quit around age 39  His wife passed away from lengthy illness recently. He is naturally having some difficulty adjusting but overall doing very well. Has very supportive friends and family  Past Medical History  Diagnosis Date  . Allergy   . Hypertension   . Hyperlipidemia   . Genital warts   . Hx of colonic polyps    Past Surgical History  Procedure Laterality Date  . Hernia repair      reports that he quit smoking about 42 years ago. His smoking use included Cigarettes. He has a 6 pack-year smoking history. He has quit using smokeless tobacco. His smokeless tobacco use included Chew. He reports that he does not drink alcohol. His drug history is not on file. family history includes Arthritis in an other family member; Cancer in his father and sister; Hyperlipidemia in an other family member; Hypertension in an other family member. No Known Allergies  1.  Risk factors based on Past Medical , Social, and Family history reviewed and as above 2.  Limitations in physical activities exercises regularly 5 days per week 3.  Depression/mood some reactive depression from is loss of wife recently. Overall coping well 4.  Hearing no deficits 5.  ADLs independent in all 6.  Cognitive function (orientation to time and place, language, writing, speech,memory) no memory deficits. Language and judgment intact  7.  Home Safety no issues 8.  Height, weight, and visual acuity. All stable. Had some mild weight gain during the past year but plans to lose  this soon. 9.  Counseling discuss exercise weight loss 10. Recommendation of preventive services. Flu vaccine given. Check on coverage for shingles vaccine. 11. Labs based on risk factors 12. Care Plan as above    Review of Systems  Constitutional: Negative for fever, activity change, appetite change and fatigue.  HENT: Negative for ear pain, congestion and trouble swallowing.   Eyes: Negative for pain and visual disturbance.  Respiratory: Negative for cough, shortness of breath and wheezing.   Cardiovascular: Negative for chest pain and palpitations.  Gastrointestinal: Negative for nausea, vomiting, abdominal pain, diarrhea, constipation, blood in stool, abdominal distention and rectal pain.  Genitourinary: Negative for dysuria, hematuria and testicular pain.  Musculoskeletal: Negative for joint swelling and arthralgias.  Skin: Negative for rash.  Neurological: Negative for dizziness, syncope and headaches.  Hematological: Negative for adenopathy.  Psychiatric/Behavioral: Negative for confusion and dysphoric mood.       Objective:   Physical Exam  Constitutional: He is oriented to person, place, and time. He appears well-developed and well-nourished. No distress.  HENT:  Head: Normocephalic and atraumatic.  Right Ear: External ear normal.  Left Ear: External ear normal.  Mouth/Throat: Oropharynx is clear and moist.  Eyes: Conjunctivae and EOM are normal. Pupils are equal, round, and reactive to light.  Neck: Normal range of motion. Neck supple. No thyromegaly present.  Cardiovascular: Normal rate, regular rhythm and normal heart sounds.   No murmur heard. Pulmonary/Chest: No respiratory distress. He has no wheezes. He has no rales.  Abdominal: Soft. Bowel sounds are normal. He  exhibits no distension and no mass. There is no tenderness. There is no rebound and no guarding.  Genitourinary: Rectum normal and prostate normal.  Musculoskeletal: He exhibits no edema.   Lymphadenopathy:    He has no cervical adenopathy.  Neurological: He is alert and oriented to person, place, and time. He displays normal reflexes. No cranial nerve deficit.  Skin: No rash noted.  Psychiatric: He has a normal mood and affect.          Assessment & Plan:  Complete physical. Obtain lab work. Flu vaccine given. Check on coverage for shingles vaccine. Colonoscopy up to date. Discussed need for weight loss/weight control.  History of borderline elevated lipids. Check lipid panel  Borderline elevated blood pressure. Continue weight loss efforts and close monitoring

## 2013-05-30 ENCOUNTER — Ambulatory Visit (INDEPENDENT_AMBULATORY_CARE_PROVIDER_SITE_OTHER): Payer: Medicare Other | Admitting: Family Medicine

## 2013-05-30 ENCOUNTER — Encounter: Payer: Self-pay | Admitting: Family Medicine

## 2013-05-30 VITALS — BP 138/80 | HR 72 | Temp 98.1°F | Wt 231.0 lb

## 2013-05-30 DIAGNOSIS — R1310 Dysphagia, unspecified: Secondary | ICD-10-CM | POA: Diagnosis not present

## 2013-05-30 DIAGNOSIS — F4321 Adjustment disorder with depressed mood: Secondary | ICD-10-CM | POA: Diagnosis not present

## 2013-05-30 DIAGNOSIS — R252 Cramp and spasm: Secondary | ICD-10-CM

## 2013-05-30 LAB — MAGNESIUM: Magnesium: 2.1 mg/dL (ref 1.5–2.5)

## 2013-05-30 MED ORDER — SERTRALINE HCL 50 MG PO TABS: 50.0000 mg | ORAL_TABLET | Freq: Every day | ORAL | Status: DC

## 2013-05-30 NOTE — Progress Notes (Signed)
Pre visit review using our clinic review tool, if applicable. No additional management support is needed unless otherwise documented below in the visit note. 

## 2013-05-30 NOTE — Progress Notes (Signed)
  Subjective:    Patient ID: Lee Smith, male    DOB: 1946-03-12, 67 y.o.   MRN: [id number]  HPI Patient seen for the following issues Bilateral leg cramps which occur both day and night. Also has occasional abdominal cramps. Generally stays well hydrated. No clear provoking factors. He does not take any diuretics. Recent labs including calcium and potassium levels were normal. He exercises about 3 days per week and has not had any exercise intolerance.  Intermittent dysphagia to foods. No appetite or weight changes. Very similar symptoms over one year ago. Saw a gastroenterologist had swallowing study which was unremarkable. He does not have any pain with swallowing. Symptoms are very inconsistent. No dysphagia to liquids. No active GERD symptoms.  Patient lost his wife several months ago. He's had some progressive depressive symptoms. No suicidal ideation. Generally eating well. Frequent crying spells. Progressive depressed mood. He has very supportive friends and family. He is stating engaged with exercise and other activities. No prior history of depression  Past Medical History  Diagnosis Date  . Allergy   . Hypertension   . Hyperlipidemia   . Genital warts   . Hx of colonic polyps    Past Surgical History  Procedure Laterality Date  . Hernia repair      reports that he quit smoking about 42 years ago. His smoking use included Cigarettes. He has a 6 pack-year smoking history. He has quit using smokeless tobacco. His smokeless tobacco use included Chew. He reports that he does not drink alcohol. His drug history is not on file. family history includes Arthritis in an other family member; Cancer in his father and sister; Hyperlipidemia in an other family member; Hypertension in an other family member. No Known Allergies    Review of Systems  Constitutional: Negative for appetite change, fatigue and unexpected weight change.  HENT: Negative for voice change.   Eyes: Negative for  visual disturbance.  Respiratory: Negative for cough, chest tightness and shortness of breath.   Cardiovascular: Negative for chest pain, palpitations and leg swelling.  Gastrointestinal: Negative for nausea, vomiting and abdominal pain.  Neurological: Negative for dizziness, syncope, weakness, light-headedness and headaches.  Psychiatric/Behavioral: Positive for dysphoric mood. Negative for suicidal ideas, hallucinations and confusion.       Objective:   Physical Exam  Constitutional: He appears well-developed and well-nourished.  HENT:  Mouth/Throat: Oropharynx is clear and moist.  Neck: Neck supple. No thyromegaly present.  Cardiovascular: Normal rate and regular rhythm.   Pulmonary/Chest: Effort normal and breath sounds normal. No respiratory distress. He has no wheezes. He has no rales.  Musculoskeletal: He exhibits no edema.  Lymphadenopathy:    He has no cervical adenopathy.  Psychiatric: Judgment and thought content normal.  Crying off and on during interview but answers questions appropriately.          Assessment & Plan:  #1 bilateral leg cramps. He does not appear to have any dehydration issues. Recent electrolytes normal. Check magnesium level. Alreadty tried multi-vitamin B complex which has not helped #2 adjustment disorder with depressed mood. Continue regular exercise. We discussed counseling options. Start sertraline 50 mg once daily and reassess in 3-4 weeks #3 recurrent intermittent dysphagia. He does not have any red flags such as appetite or weight changes. He is encouraged to followup with gastroenterologist whom he is established with

## 2013-05-30 NOTE — Patient Instructions (Signed)
Magnesium Rich Diet Magnesium is a needed mineral found in the body. It is important for strong bones and a healthy heart. Magnesium also plays a role in muscle and nerve function. Certain medical conditions, such as poorly controlled diabetes and gastrointestinal disease, may lead to a lack of magnesium in the body (magnesium deficiency). SOURCES OF MAGNESIUM  Green vegetables such as spinach are good sources of magnesium.  Some beans and peas (legumes), nuts and seeds, and whole, unrefined grains are also good sources of magnesium. Refined grains are generally low in magnesium. When white flour is refined and processed, the magnesium-rich germ and bran are removed.  Bread made from whole-grain wheat flour provides more magnesium than bread made from white refined flour.  Tap water can be a source of magnesium, but the amount varies according to the water supply. Water that naturally contains more minerals is described as "hard." Hard water contains more magnesium than "soft" water. Eating a wide variety of legumes, nuts, whole grains, and green vegetables will help you meet your daily dietary need for magnesium. Selected food sources of magnesium are listed below. FOOD SOURCES Food / Magnesium (mg)  Almonds, dry roasted, 1 oz / 80 mg  Cashews, dry roasted, 1 oz / 75 mg  Soybeans, mature, cooked,  cup / 75 mg  Spinach, frozen, cooked,  cup / 75 mg  Nuts, mixed, dry roasted, 1 oz / 65 mg  Cereal, shredded wheat, 1 bowl / 55 mg  Oatmeal, instant, prepared with water, 1 cup / 55 mg  Potato, baked with skin, 1 medium / 50 mg  Peanuts, dry roasted, 1 oz / 50 mg  Peanut butter, smooth, 2 tbs / 50 mg  Wheat bran, crude, 2 tbs / 45 mg  Black-eyed peas, cooked,  cup / 45 mg  Yogurt, plain, skim milk, 8 oz / 45 mg  Bran flakes,  cup / 40 mg  Vegetarian baked beans,  cup / 40 mg  Brown rice, long-grained, cooked,  cup / 40 mg  Lentils, mature seeds, cooked,  cup / 35  mg  Avocado,  cup pureed / 35 mg  Kidney beans, canned,  cup / 35 mg  Pinto beans, cooked,  cup / 35 mg  Wheat germ, crude, 2 tbs / 35 mg  Chocolate milk, 1 cup / 33 mg  Banana, 1 medium / 30 mg  Milk chocolate candy bar, 1  oz / 28 mg  Milk, reduced fat (2%) or fat-free, 1 cup / 28 mg  Bread, whole-wheat, 1 slice / 25 mg  Raisins, seedless,  cup / 25 mg  Whole milk, 1 cup / 24 mg  Chocolate pudding, 4 oz / 24 mg RECOMMENDED DIETARY ALLOWANCES FOR MAGNESIUM The following list shows the Recommended Dietary Allowances for magnesium in milligrams per day for children and adults. Age: 1 to 68 years  Male: 80 mg  Male: 80 mg Age: 4 to 68 years  Male: 130 mg  Male: 130 mg Age: 9 to 68 years  Male: 240 mg  Male: 240 mg Age: 14 to 68 years  Male: 410 mg  Male: 360 mg  Pregnant: 400 mg  Breastfeeding: 360 mg Age: 19 to 68 years  Male: 400 mg  Male: 310 mg  Pregnant: 350 mg  Breastfeeding: 310 mg Age: 31 years or older  Male: 420 mg  Male: 320 mg  Pregnant: 360 mg  Breastfeeding: 320 mg RECOMMENDED ADEQUATE INTAKE FOR MAGNESIUM FOR INFANTS There is not enough information   on magnesium to establish Recommended Dietary Allowances for infants. The following list shows the average intake of magnesium in healthy, breastfed infants in milligrams per day. Age:  0 to 6 months  Male: 30 mg  Male: 30 mg Age: 7 to 12 months  Male: 75 mg  Male: 75 mg HEALTH RISKS OF TOO MUCH MAGNESIUM Dietary magnesium does not pose a health risk. However, large doses of magnesium in supplements can cause adverse effects such as diarrhea and abdominal cramping. Risk of magnesium toxicity increases with kidney failure, when the kidney loses the ability to remove excess magnesium. Very large doses of magnesium-containing laxatives and antacids have also been associated with magnesium toxicity. Signs of excess magnesium can be similar to magnesium deficiency  and include changes in mental status, nausea, diarrhea, appetite loss, muscle weakness, difficulty breathing, extremely low blood pressure, and irregular heartbeat.  TOLERABLE UPPER INTAKE LEVELS FOR SUPPLEMENTAL MAGNESIUM The following list shows the tolerable limits (upper intake levels) for magnesium from supplements in healthy infants, children, and adults in milligrams per day. A caregiver may prescribe magnesium in higher doses for specific medical problems. There is no upper intake level for magnesium from food sources.  Age: Infants   Male: Undetermined  Male: Undetermined Age: 4 to 68 years  Male: 110 mg  Male: 130 mg Age: 9 to 68 years  Male: 240 mg  Male: 240 mg Age: 19 years or older  Male: 350 mg  Male: 350 mg  Pregnant: 350 mg  Breastfeeding: 350 mg Document Released: 03/01/2005 Document Revised: 09/27/2011 Document Reviewed: 10/03/2008 [employer] Patient Information 2014 [employer].  

## 2013-06-28 ENCOUNTER — Ambulatory Visit (INDEPENDENT_AMBULATORY_CARE_PROVIDER_SITE_OTHER): Payer: Medicare Other | Admitting: Family Medicine

## 2013-06-28 ENCOUNTER — Ambulatory Visit: Payer: Medicare Other | Admitting: Family Medicine

## 2013-06-28 ENCOUNTER — Encounter: Payer: Self-pay | Admitting: Family Medicine

## 2013-06-28 VITALS — BP 136/80 | HR 93 | Temp 98.4°F | Wt 228.0 lb

## 2013-06-28 DIAGNOSIS — F4321 Adjustment disorder with depressed mood: Secondary | ICD-10-CM

## 2013-06-28 NOTE — Progress Notes (Signed)
   Subjective:    Patient ID: Lee Smith, male    DOB: [birth year], 67 y.o.   MRN: [id number]  HPI Patient seen for followup major depressive episode. He is dealing with loss of his wife recently. Refer to prior note. We started sertraline 50 mg once daily and he's had tremendously positive response. Fewer crying spells. Better motivation. Brighter mood. No side effects. Overall he is very pleased with this.  Dysphagia symptoms since last visit been stable  Past Medical History  Diagnosis Date  . Allergy   . Hypertension   . Hyperlipidemia   . Genital warts   . Hx of colonic polyps    Past Surgical History  Procedure Laterality Date  . Hernia repair      reports that he quit smoking about 42 years ago. His smoking use included Cigarettes. He has a 6 pack-year smoking history. He has quit using smokeless tobacco. His smokeless tobacco use included Chew. He reports that he does not drink alcohol. His drug history is not on file. family history includes Arthritis in an other family member; Cancer in his father and sister; Hyperlipidemia in an other family member; Hypertension in an other family member. No Known Allergies    Review of Systems  Constitutional: Negative for appetite change and unexpected weight change.  Psychiatric/Behavioral: Negative for suicidal ideas, sleep disturbance and agitation.       Objective:   Physical Exam  Constitutional: He is oriented to person, place, and time. He appears well-developed and well-nourished.  Cardiovascular: Normal rate.   Pulmonary/Chest: Effort normal and breath sounds normal. No respiratory distress. He has no wheezes. He has no rales.  Neurological: He is alert and oriented to person, place, and time. No cranial nerve deficit.  Psychiatric: He has a normal mood and affect. His behavior is normal.          Assessment & Plan:  Major depressive episode significantly improved. Continue sertraline 50 mg daily and would recommend  a minimum of 4-9 months of therapy. He will consider tapering off in the spring. We've offered ongoing help with counseling and other support as necessary.

## 2013-06-28 NOTE — Patient Instructions (Signed)
Stay on Sertraline for another 3-8 months and then consider discontinue.

## 2013-06-28 NOTE — Progress Notes (Signed)
Pre visit review using our clinic review tool, if applicable. No additional management support is needed unless otherwise documented below in the visit note. 

## 2013-08-30 DIAGNOSIS — M25569 Pain in unspecified knee: Secondary | ICD-10-CM | POA: Diagnosis not present

## 2013-08-30 DIAGNOSIS — IMO0002 Reserved for concepts with insufficient information to code with codable children: Secondary | ICD-10-CM | POA: Diagnosis not present

## 2013-08-30 DIAGNOSIS — M1710 Unilateral primary osteoarthritis, unspecified knee: Secondary | ICD-10-CM | POA: Diagnosis not present

## 2013-10-08 ENCOUNTER — Ambulatory Visit (INDEPENDENT_AMBULATORY_CARE_PROVIDER_SITE_OTHER): Payer: Medicare Other | Admitting: Family Medicine

## 2013-10-08 ENCOUNTER — Encounter: Payer: Self-pay | Admitting: Family Medicine

## 2013-10-08 VITALS — BP 130/80 | HR 77 | Wt 228.0 lb

## 2013-10-08 DIAGNOSIS — J309 Allergic rhinitis, unspecified: Secondary | ICD-10-CM

## 2013-10-08 DIAGNOSIS — J302 Other seasonal allergic rhinitis: Secondary | ICD-10-CM

## 2013-10-08 DIAGNOSIS — N529 Male erectile dysfunction, unspecified: Secondary | ICD-10-CM

## 2013-10-08 MED ORDER — TADALAFIL 20 MG PO TABS: ORAL_TABLET | ORAL | Status: DC

## 2013-10-08 NOTE — Progress Notes (Signed)
   Subjective:    Patient ID: Lee Smith, male    DOB: 1946/01/06, 68 y.o.   MRN: [id number]  HPI Patient seen for the following issues:  Probable allergies. Has had seasonal issues in the spring. Had some nasal congestion, sneezing, postnasal drip. He started Zyrtec a few days ago without much improvement. He has occasional sensation of increased mucus in his throat. No recent active GERD symptoms. No facial pain. No headaches. No fevers or chills. His tried Flonase in the past with improvement.  Patient relates issues with erectile dysfunction. He has a girlfriend he started dating recently. Nonsmoker. Exercises fairly frequent. No history of CAD or peripheral vascular disease. Good libido. No nitroglycerin use.  Past Medical History  Diagnosis Date  . Allergy   . Hypertension   . Hyperlipidemia   . Genital warts   . Hx of colonic polyps    Past Surgical History  Procedure Laterality Date  . Hernia repair      reports that he quit smoking about 43 years ago. His smoking use included Cigarettes. He has a 6 pack-year smoking history. He has quit using smokeless tobacco. His smokeless tobacco use included Chew. He reports that he does not drink alcohol. His drug history is not on file. family history includes Arthritis in an other family member; Cancer in his father and sister; Hyperlipidemia in an other family member; Hypertension in an other family member. No Known Allergies    Review of Systems  Constitutional: Negative for fever and chills.  HENT: Positive for congestion, postnasal drip and rhinorrhea. Negative for sore throat, trouble swallowing and voice change.   Respiratory: Negative for shortness of breath.   Cardiovascular: Negative for chest pain.  Neurological: Negative for headaches.       Objective:   Physical Exam  Constitutional: He appears well-developed and well-nourished.  HENT:  Right Ear: External ear normal.  Left Ear: External ear normal.    Mouth/Throat: Oropharynx is clear and moist.  Neck: Neck supple.  Cardiovascular: Normal rate.   Pulmonary/Chest: Effort normal and breath sounds normal. No respiratory distress. He has no wheezes. He has no rales.  Lymphadenopathy:    He has no cervical adenopathy.          Assessment & Plan:  #1 seasonal allergic rhinitis. Add Flonase or Nasacort Zyrtec. Consider Singulair or nasal antihistamine if not getting adequate relief with the above #2 erectile dysfunction. Trial of Cialis 20 mg one half to one tablet every other day as needed.

## 2013-10-08 NOTE — Progress Notes (Signed)
Pre visit review using our clinic review tool, if applicable. No additional management support is needed unless otherwise documented below in the visit note. 

## 2013-10-08 NOTE — Patient Instructions (Signed)
Try over the counter Flonase or Nasacort for allergy symptoms.

## 2013-10-11 ENCOUNTER — Telehealth: Payer: Self-pay | Admitting: Family Medicine

## 2013-10-11 MED ORDER — PREDNISONE (PAK) 10 MG PO TABS: ORAL_TABLET | ORAL | Status: DC

## 2013-10-11 NOTE — Telephone Encounter (Signed)
Pt was seen on 10-08-13 for allergies and now has cough and congestion. Pt would like something call into [facility]. Pt said he was told to callback if needed

## 2013-10-11 NOTE — Telephone Encounter (Signed)
Pt aware and RX sent to pharmacy. 

## 2013-10-11 NOTE — Telephone Encounter (Signed)
Let's try low dose prednisone taper.  Prednisone 10 mg : taper 4-4-3-3-2-2.Marland Kitchen Follow up for any fever or new symptoms.

## 2013-11-16 DIAGNOSIS — Z87442 Personal history of urinary calculi: Secondary | ICD-10-CM

## 2013-11-16 HISTORY — DX: Personal history of urinary calculi: Z87.442

## 2013-11-22 ENCOUNTER — Ambulatory Visit: Payer: Medicare Other | Admitting: Family Medicine

## 2013-11-22 ENCOUNTER — Emergency Department (HOSPITAL_COMMUNITY)
Admission: EM | Admit: 2013-11-22 | Discharge: 2013-11-22 | Disposition: A | Discharge status: Home / Self Care | Payer: Medicare Other | Attending: Emergency Medicine | Admitting: Emergency Medicine

## 2013-11-22 ENCOUNTER — Encounter (HOSPITAL_COMMUNITY): Payer: Self-pay | Admitting: Emergency Medicine

## 2013-11-22 ENCOUNTER — Emergency Department (HOSPITAL_COMMUNITY): Payer: Medicare Other

## 2013-11-22 DIAGNOSIS — R109 Unspecified abdominal pain: Secondary | ICD-10-CM

## 2013-11-22 DIAGNOSIS — Z860100 Personal history of colon polyps, unspecified: Secondary | ICD-10-CM | POA: Insufficient documentation

## 2013-11-22 DIAGNOSIS — Z87891 Personal history of nicotine dependence: Secondary | ICD-10-CM | POA: Diagnosis not present

## 2013-11-22 DIAGNOSIS — Z8619 Personal history of other infectious and parasitic diseases: Secondary | ICD-10-CM | POA: Diagnosis not present

## 2013-11-22 DIAGNOSIS — N200 Calculus of kidney: Secondary | ICD-10-CM | POA: Diagnosis not present

## 2013-11-22 DIAGNOSIS — E785 Hyperlipidemia, unspecified: Secondary | ICD-10-CM | POA: Diagnosis not present

## 2013-11-22 DIAGNOSIS — N201 Calculus of ureter: Secondary | ICD-10-CM

## 2013-11-22 DIAGNOSIS — Z791 Long term (current) use of non-steroidal anti-inflammatories (NSAID): Secondary | ICD-10-CM | POA: Diagnosis not present

## 2013-11-22 DIAGNOSIS — Z79899 Other long term (current) drug therapy: Secondary | ICD-10-CM | POA: Diagnosis not present

## 2013-11-22 DIAGNOSIS — I10 Essential (primary) hypertension: Secondary | ICD-10-CM | POA: Insufficient documentation

## 2013-11-22 LAB — CBC WITH DIFFERENTIAL/PLATELET
BASOS ABS: 0 10*3/uL (ref 0.0–0.1)
BASOS PCT: 0 % (ref 0–1)
EOS PCT: 0 % (ref 0–5)
Eosinophils Absolute: 0 10*3/uL (ref 0.0–0.7)
HEMATOCRIT: 40.5 % (ref 39.0–52.0)
Hemoglobin: 14 g/dL (ref 13.0–17.0)
Lymphocytes Relative: 8 % — ABNORMAL LOW (ref 12–46)
Lymphs Abs: 0.7 10*3/uL (ref 0.7–4.0)
MCH: 32.7 pg (ref 26.0–34.0)
MCHC: 34.6 g/dL (ref 30.0–36.0)
MCV: 94.6 fL (ref 78.0–100.0)
MONO ABS: 0.5 10*3/uL (ref 0.1–1.0)
Monocytes Relative: 6 % (ref 3–12)
Neutro Abs: 8.3 10*3/uL — ABNORMAL HIGH (ref 1.7–7.7)
Neutrophils Relative %: 86 % — ABNORMAL HIGH (ref 43–77)
Platelets: 234 10*3/uL (ref 150–400)
RBC: 4.28 MIL/uL (ref 4.22–5.81)
RDW: 12.6 % (ref 11.5–15.5)
WBC: 9.6 10*3/uL (ref 4.0–10.5)

## 2013-11-22 LAB — URINALYSIS, ROUTINE W REFLEX MICROSCOPIC
BILIRUBIN URINE: NEGATIVE
Glucose, UA: NEGATIVE mg/dL
KETONES UR: NEGATIVE mg/dL
Leukocytes, UA: NEGATIVE
NITRITE: NEGATIVE
PH: 5.5 (ref 5.0–8.0)
Protein, ur: NEGATIVE mg/dL
Specific Gravity, Urine: 1.02 (ref 1.005–1.030)
Urobilinogen, UA: 0.2 mg/dL (ref 0.0–1.0)

## 2013-11-22 LAB — BASIC METABOLIC PANEL
BUN: 22 mg/dL (ref 6–23)
CALCIUM: 9.1 mg/dL (ref 8.4–10.5)
CO2: 23 meq/L (ref 19–32)
Chloride: 103 mEq/L (ref 96–112)
Creatinine, Ser: 1 mg/dL (ref 0.50–1.35)
GFR calc Af Amer: 87 mL/min — ABNORMAL LOW (ref >90)
GFR calc non Af Amer: 75 mL/min — ABNORMAL LOW (ref >90)
GLUCOSE: 102 mg/dL — AB (ref 70–99)
Potassium: 4.1 mEq/L (ref 3.7–5.3)
SODIUM: 141 meq/L (ref 137–147)

## 2013-11-22 LAB — URINE MICROSCOPIC-ADD ON

## 2013-11-22 MED ORDER — OXYCODONE-ACETAMINOPHEN 5-325 MG PO TABS: 1.0000 | ORAL_TABLET | Freq: Four times a day (QID) | ORAL | Status: DC | PRN

## 2013-11-22 MED ORDER — ONDANSETRON HCL 4 MG PO TABS: 4.0000 mg | ORAL_TABLET | Freq: Four times a day (QID) | ORAL | Status: DC

## 2013-11-22 MED ORDER — IBUPROFEN 600 MG PO TABS: 600.0000 mg | ORAL_TABLET | Freq: Four times a day (QID) | ORAL | Status: DC | PRN

## 2013-11-22 MED ORDER — MORPHINE SULFATE 4 MG/ML IJ SOLN
4.0000 mg | Freq: Once | INTRAMUSCULAR | Status: AC
  Administered 2013-11-22: 4 mg via INTRAVENOUS
  Filled 2013-11-22 (×2): qty 1

## 2013-11-22 MED ORDER — SODIUM CHLORIDE 0.9 % IV BOLUS (SEPSIS)
1000.0000 mL | Freq: Once | INTRAVENOUS | Status: AC
  Administered 2013-11-22: 1000 mL via INTRAVENOUS

## 2013-11-22 MED ORDER — TAMSULOSIN HCL 0.4 MG PO CAPS: 0.4000 mg | ORAL_CAPSULE | Freq: Every day | ORAL | Status: DC

## 2013-11-22 MED ORDER — ONDANSETRON HCL 4 MG/2ML IJ SOLN
4.0000 mg | INTRAMUSCULAR | Status: AC
  Administered 2013-11-22: 4 mg via INTRAVENOUS
  Filled 2013-11-22 (×2): qty 2

## 2013-11-22 NOTE — Discharge Instructions (Signed)
Kidney Stones  Kidney stones (urolithiasis) are deposits that form inside your kidneys. The intense pain is caused by the stone moving through the urinary tract. When the stone moves, the ureter goes into spasm around the stone. The stone is usually passed in the urine.   CAUSES   · A disorder that makes certain neck glands produce too much parathyroid hormone (primary hyperparathyroidism).  · A buildup of uric acid crystals, similar to gout in your joints.  · Narrowing (stricture) of the ureter.  · A kidney obstruction present at birth (congenital obstruction).  · Previous surgery on the kidney or ureters.  · Numerous kidney infections.  SYMPTOMS   · Feeling sick to your stomach (nauseous).  · Throwing up (vomiting).  · Blood in the urine (hematuria).  · Pain that usually spreads (radiates) to the groin.  · Frequency or urgency of urination.  DIAGNOSIS   · Taking a history and physical exam.  · Blood or urine tests.  · CT scan.  · Occasionally, an examination of the inside of the urinary bladder (cystoscopy) is performed.  TREATMENT   · Observation.  · Increasing your fluid intake.  · Extracorporeal shock wave lithotripsy This is a noninvasive procedure that uses shock waves to break up kidney stones.  · Surgery may be needed if you have severe pain or persistent obstruction. There are various surgical procedures. Most of the procedures are performed with the use of small instruments. Only small incisions are needed to accommodate these instruments, so recovery time is minimized.  The size, location, and chemical composition are all important variables that will determine the proper choice of action for you. Talk to your health care provider to better understand your situation so that you will minimize the risk of injury to yourself and your kidney.   HOME CARE INSTRUCTIONS   · Drink enough water and fluids to keep your urine clear or pale yellow. This will help you to pass the stone or stone fragments.  · Strain  all urine through the provided strainer. Keep all particulate matter and stones for your health care provider to see. The stone causing the pain may be as small as a grain of salt. It is very important to use the strainer each and every time you pass your urine. The collection of your stone will allow your health care provider to analyze it and verify that a stone has actually passed. The stone analysis will often identify what you can do to reduce the incidence of recurrences.  · Only take over-the-counter or prescription medicines for pain, discomfort, or fever as directed by your health care provider.  · Make a follow-up appointment with your health care provider as directed.  · Get follow-up X-rays if required. The absence of pain does not always mean that the stone has passed. It may have only stopped moving. If the urine remains completely obstructed, it can cause loss of kidney function or even complete destruction of the kidney. It is your responsibility to make sure X-rays and follow-ups are completed. Ultrasounds of the kidney can show blockages and the status of the kidney. Ultrasounds are not associated with any radiation and can be performed easily in a matter of minutes.  SEEK MEDICAL CARE IF:  · You experience pain that is progressive and unresponsive to any pain medicine you have been prescribed.  SEEK IMMEDIATE MEDICAL CARE IF:   · Pain cannot be controlled with the prescribed medicine.  · You have a fever   or shaking chills.  · The severity or intensity of pain increases over 18 hours and is not relieved by pain medicine.  · You develop a new onset of abdominal pain.  · You feel faint or pass out.  · You are unable to urinate.  MAKE SURE YOU:   · Understand these instructions.  · Will watch your condition.  · Will get help right away if you are not doing well or get worse.  Document Released: 07/05/2005 Document Revised: 03/07/2013 Document Reviewed: 12/06/2012  [employer]® Patient Information ©2014  [employer].

## 2013-11-22 NOTE — ED Provider Notes (Signed)
Medical screening examination/treatment/procedure(s) were conducted as a shared visit with non-physician practitioner(s) or resident  and myself.  I personally evaluated the patient during the encounter and agree with the findings and plan unless otherwise indicated.    I have personally reviewed any xrays and/ or EKG's with the provider and I agree with interpretation.   Patient with intermittent right flank pain since this morning. No history of similar or history of kidney stones. No fevers or vomiting. No aneurysm history or significant radiation. Patient's pain is controlled in the ER without vomiting. CT scan showed 7 mm kidney stone. Patient well-appearing in stable for close outpatient followup with urology.  Right nephrolithiasis right flank pain  Mariea Clonts, MD 11/22/13 1659

## 2013-11-22 NOTE — ED Provider Notes (Signed)
CSN: [id number]     Arrival date & time 11/22/13  1009 History   First MD Initiated Contact with Patient 11/22/13 1022     Chief Complaint  Patient presents with  . Flank Pain    (Consider location/radiation/quality/duration/timing/severity/associated sxs/prior Treatment) HPI Comments: 68 year old male presents for right flank pain with onset this morning. Patient states the pain has been constant and waxing and waning in severity. When pain is most severe, patient versus associated diaphoresis. He denies any recent trauma or heavy lifting. No fever, chest pain, shortness of breath, nausea, vomiting, diarrhea, melena or hematochezia, hematuria, dysuria, urinary frequency, numbness/tingling, weakness, and bowel or bladder incontinence. Patient denies a history of kidney stones. His last bowel movement was yesterday which was normal.  Patient is a 68 y.o. male presenting with flank pain. The history is provided by the patient. No language interpreter was used.  Flank Pain This is a new problem. The problem occurs constantly. The problem has been waxing and waning. Associated symptoms include diaphoresis. Pertinent negatives include no abdominal pain, chest pain, chills, fever, nausea, numbness, urinary symptoms, vomiting or weakness. Nothing aggravates the symptoms. He has tried nothing for the symptoms.    Past Medical History  Diagnosis Date  . Allergy   . Hypertension   . Hyperlipidemia   . Genital warts   . Hx of colonic polyps    Past Surgical History  Procedure Laterality Date  . Hernia repair     Family History  Problem Relation Age of Onset  . Cancer Father     prostate  . Cancer Sister     breast  . Arthritis      family hx  . Hyperlipidemia      family hx  . Hypertension      family hx   History  Substance Use Topics  . Smoking status: Former Smoker -- 1.00 packs/day for 6 years    Types: Cigarettes    Quit date: 10/12/1970  . Smokeless tobacco: Former Systems developer   Types: Chew  . Alcohol Use: No    Review of Systems  Constitutional: Positive for diaphoresis. Negative for fever and chills.  Cardiovascular: Negative for chest pain.  Gastrointestinal: Negative for nausea, vomiting and abdominal pain.  Genitourinary: Positive for flank pain.  Neurological: Negative for weakness and numbness.  All other systems reviewed and are negative.     Allergies  Review of patient's allergies indicates no known allergies.  Home Medications   Prior to Admission medications   Medication Sig Start Date End Date Taking? Authorizing Provider  B Complex Vitamins (VITAMIN-B COMPLEX PO) Take 1 tablet by mouth daily as needed.    Yes Historical Provider, MD  fluticasone (FLONASE) 50 MCG/ACT nasal spray Place 1 spray into both nostrils daily as needed for allergies or rhinitis.   Yes Historical Provider, MD  meloxicam (MOBIC) 15 MG tablet Take 15 mg by mouth daily.   Yes Historical Provider, MD  methylcellulose (ARTIFICIAL TEARS) 1 % ophthalmic solution Place 1 drop into both eyes as needed.   Yes Historical Provider, MD  Naproxen Sodium (ALEVE) 220 MG CAPS Take 1-2 capsules by mouth as needed (pain).   Yes Historical Provider, MD  tadalafil (CIALIS) 20 MG tablet Take 20 mg by mouth daily as needed for erectile dysfunction.   Yes Historical Provider, MD   BP 168/84  Pulse 68  Temp(Src) 98.4 F (36.9 C) (Oral)  Resp 16  Ht 6\' 1"  (1.854 m)  Wt 225 lb (102.059 kg)  BMI 29.69 kg/m2  SpO2 98%  Physical Exam  Nursing note and vitals reviewed. Constitutional: He is oriented to person, place, and time. He appears well-developed and well-nourished. No distress.  HENT:  Head: Normocephalic and atraumatic.  Mouth/Throat: Oropharynx is clear and moist. No oropharyngeal exudate.  Eyes: Conjunctivae and EOM are normal. No scleral icterus.  Neck: Normal range of motion.  Cardiovascular: Normal rate, regular rhythm and normal heart sounds.   Pulmonary/Chest: Effort  normal and breath sounds normal. No respiratory distress. He has no wheezes. He has no rales.  Abdominal: Soft. Normal appearance. He exhibits no distension. There is tenderness (Mild to right midabdomen). There is no rebound, no guarding, no CVA tenderness, no tenderness at McBurney's point and negative Murphy's sign.    No peritoneal signs  Musculoskeletal: Normal range of motion.  No palpable tenderness to the thoracic or lumbosacral midline or paraspinal muscles. Normal range of motion of back.  Neurological: He is alert and oriented to person, place, and time.  GCS 15. No gross sensory deficits appreciated. Patient ambulatory with normal gait.  Skin: Skin is warm and dry. No rash noted. He is not diaphoretic. No erythema. No pallor.  Psychiatric: He has a normal mood and affect. His behavior is normal.    ED Course  Procedures (including critical care time) Labs Review Labs Reviewed  CBC WITH DIFFERENTIAL - Abnormal; Notable for the following:    Neutrophils Relative % 86 (*)    Neutro Abs 8.3 (*)    Lymphocytes Relative 8 (*)    All other components within normal limits  URINALYSIS, ROUTINE W REFLEX MICROSCOPIC - Abnormal; Notable for the following:    APPearance CLOUDY (*)    Hgb urine dipstick LARGE (*)    All other components within normal limits  BASIC METABOLIC PANEL - Abnormal; Notable for the following:    Glucose, Bld 102 (*)    GFR calc non Af Amer 75 (*)    GFR calc Af Amer 87 (*)    All other components within normal limits  URINE MICROSCOPIC-ADD ON - Abnormal; Notable for the following:    Bacteria, UA FEW (*)    All other components within normal limits    Imaging Review Ct Abdomen Pelvis Wo Contrast  11/22/2013   CLINICAL DATA:  Right flank pain  EXAM: CT ABDOMEN AND PELVIS WITHOUT CONTRAST  TECHNIQUE: Multidetector CT imaging of the abdomen and pelvis was performed following the standard protocol without IV contrast.  COMPARISON:  None.  FINDINGS: Renal:  There is right renal edema and perinephric stranding. There is mild right hydronephrosis. This is secondary to an obstructing calculi at the right ureteropelvic junction measuring 7 mm (image 39, series 2). There is additional 3 mm and 1 mm nonobstructing calculus within the right kidney. There is a punctate 1 mm calculus in lower pole of the left kidney. No ureteral lithiasis  Lung bases are clear.  No pericardial fluid.  No focal hepatic lesion. The gallbladder, pancreas, spleen, adrenal glands are normal.  The stomach, small bowel, appendix, and cecum are normal. The colon and rectosigmoid colon are normal.  Abdominal is normal caliber. No retroperitoneal periportal lymphadenopathy.  No free fluid the pelvis. No pelvic lymphadenopathy. No aggressive osseous lesion.  IMPRESSION: 1. Obstructing right ureteropelvic junction calculus with moderate hydronephrosis and perinephric stranding. 2. Bilateral nephrolithiasis.   Electronically Signed   By: Suzy Bouchard M.D.   On: 11/22/2013 11:49     EKG Interpretation None  MDM   Final diagnoses:  Ureterolithiasis  Right flank pain    Patient presents for right flank pain with onset this morning. Symptoms associated with diaphoresis when the pain worsens. Patient denies a history of kidney stones. He has been diagnosed with a 77mm kidney stone at right UPJ via CT; findings of hematuria c/w this diagnosis. Patient with moderate hydronephrosis on CT. Kidney function preserved. No leukocytosis. No evidence of complicating features or UTI. VSS and patient initially refused pain medication in ED. Patient stable and appropriate for d/c with instruction to f/u with urology and his PCP. Will prescribe Ibuprofen, percocet, zofran, and flomax for management. Have advised patient that stone may not pass secondary to size. Return precautions discussed and provided. Patient agreeable to plan with no unaddressed concerns.   Filed Vitals:   11/22/13 1019 11/22/13  1325  BP: 163/84 168/84  Pulse: 68 68  Temp: 98 F (36.7 C) 98.4 F (36.9 C)  TempSrc: Oral Oral  Resp: 18 16  Height: 6\' 1"  (1.854 m)   Weight: 225 lb (102.059 kg)   SpO2: 98% 98%       Antonietta Breach, PA-C 11/22/13 1409

## 2013-11-22 NOTE — ED Notes (Signed)
Pt states kept breaking out in a cold sweat this morning d/t pain when he was trying to load bags into his truck.

## 2013-11-22 NOTE — ED Notes (Signed)
Pt states woke up this morning w/ R flank pain, denies n/v/d, denies urinary problems, states this is different than typical back pain.

## 2013-11-22 NOTE — ED Notes (Signed)
Pt reports 6/10 right sided flank pain. Pt refuses pain medication at present time.

## 2013-11-23 DIAGNOSIS — N200 Calculus of kidney: Secondary | ICD-10-CM | POA: Diagnosis not present

## 2013-11-23 DIAGNOSIS — N201 Calculus of ureter: Secondary | ICD-10-CM | POA: Diagnosis not present

## 2013-12-12 ENCOUNTER — Other Ambulatory Visit: Payer: Self-pay | Admitting: Urology

## 2013-12-12 DIAGNOSIS — N201 Calculus of ureter: Secondary | ICD-10-CM | POA: Diagnosis not present

## 2013-12-13 ENCOUNTER — Encounter (HOSPITAL_COMMUNITY): Payer: Self-pay | Admitting: *Deleted

## 2013-12-13 ENCOUNTER — Encounter (HOSPITAL_COMMUNITY): Payer: Self-pay | Admitting: Pharmacy Technician

## 2013-12-17 ENCOUNTER — Ambulatory Visit (HOSPITAL_COMMUNITY): Payer: Medicare Other

## 2013-12-17 ENCOUNTER — Ambulatory Visit (HOSPITAL_COMMUNITY)
Admission: RE | Admit: 2013-12-17 | Discharge: 2013-12-17 | Disposition: A | Discharge status: Home / Self Care | Payer: Medicare Other | Source: Ambulatory Visit | Attending: Urology | Admitting: Urology

## 2013-12-17 ENCOUNTER — Encounter (HOSPITAL_COMMUNITY): Payer: Self-pay | Admitting: General Practice

## 2013-12-17 ENCOUNTER — Encounter (HOSPITAL_COMMUNITY)
Admission: RE | Disposition: A | Discharge status: Home / Self Care | Payer: Self-pay | Source: Ambulatory Visit | Attending: Urology

## 2013-12-17 DIAGNOSIS — N201 Calculus of ureter: Secondary | ICD-10-CM | POA: Diagnosis not present

## 2013-12-17 DIAGNOSIS — F329 Major depressive disorder, single episode, unspecified: Secondary | ICD-10-CM | POA: Insufficient documentation

## 2013-12-17 DIAGNOSIS — I10 Essential (primary) hypertension: Secondary | ICD-10-CM | POA: Insufficient documentation

## 2013-12-17 DIAGNOSIS — M129 Arthropathy, unspecified: Secondary | ICD-10-CM | POA: Insufficient documentation

## 2013-12-17 DIAGNOSIS — F3289 Other specified depressive episodes: Secondary | ICD-10-CM | POA: Diagnosis not present

## 2013-12-17 DIAGNOSIS — Z79899 Other long term (current) drug therapy: Secondary | ICD-10-CM | POA: Insufficient documentation

## 2013-12-17 DIAGNOSIS — E7800 Pure hypercholesterolemia, unspecified: Secondary | ICD-10-CM | POA: Diagnosis not present

## 2013-12-17 DIAGNOSIS — Z01818 Encounter for other preprocedural examination: Secondary | ICD-10-CM | POA: Diagnosis not present

## 2013-12-17 DIAGNOSIS — Z5309 Procedure and treatment not carried out because of other contraindication: Secondary | ICD-10-CM | POA: Insufficient documentation

## 2013-12-17 HISTORY — PX: EXTRACORPOREAL SHOCK WAVE LITHOTRIPSY: SHX1557

## 2013-12-17 HISTORY — DX: Unspecified osteoarthritis, unspecified site: M19.90

## 2013-12-17 HISTORY — DX: Personal history of urinary calculi: Z87.442

## 2013-12-17 SURGERY — LITHOTRIPSY, ESWL
Anesthesia: LOCAL | Laterality: Right

## 2013-12-17 MED ORDER — SODIUM CHLORIDE 0.9 % IV SOLN
INTRAVENOUS | Status: DC
  Administered 2013-12-17: 12:00:00 via INTRAVENOUS

## 2013-12-17 MED ORDER — DIAZEPAM 5 MG PO TABS
10.0000 mg | ORAL_TABLET | ORAL | Status: AC
  Administered 2013-12-17: 10 mg via ORAL
  Filled 2013-12-17: qty 2

## 2013-12-17 MED ORDER — DIPHENHYDRAMINE HCL 25 MG PO CAPS
25.0000 mg | ORAL_CAPSULE | ORAL | Status: AC
  Administered 2013-12-17: 25 mg via ORAL
  Filled 2013-12-17: qty 1

## 2013-12-17 MED ORDER — CIPROFLOXACIN HCL 500 MG PO TABS
500.0000 mg | ORAL_TABLET | ORAL | Status: AC
  Administered 2013-12-17: 500 mg via ORAL
  Filled 2013-12-17: qty 1

## 2013-12-17 NOTE — Progress Notes (Addendum)
Spoke with Inez Catalina, RN on lithotripsy truck. Patient's lithotripsy was canceled due to hypertension. He did not receive sedation. He will follow up with his primary care physician and Dr. Junious Silk. Patient verbalizes understanding.

## 2013-12-17 NOTE — Interval H&P Note (Signed)
History and Physical Interval Note:  12/17/2013 8:01 PM  Lee Smith  has presented today for surgery, with the diagnosis of RIGHT URETERAL STONE  The various methods of treatment have been discussed with the patient and family. Pt blood pressure too high all day. No notification. He cannot proceed with ESWL. He is asymptomatic (no CP, vision change, SOB, HA). Procedure canceled.    Festus Aloe

## 2013-12-17 NOTE — H&P (Signed)
F/u - PCP is Burchette, Bruce.      1-ureteral stone - May 2015 - right flank pain. Nov 22, 2013 - CT A/P revealed a 7 mm right UPJ stone with hydro and smaller bilateral kidney stones. I reviewed all the images. Bun 22, cr 1.0, ua rare bacteria, TNTC rbc. He thought he passed the stone as he has been straining, but only passed 2 small flecks. Nothing equivalent to the calcification on CT. He's had no lower urinary tract symptoms. He denies prior stone episode. KUB - stone remains in the right proximal ureter. Multiple pelvic phleboliths.         May 2015 interval hx    He returns and has continued to have some intermittent mild right flank pain. He seen no stone passage. Assessment dysuria or gross hematuria.    UA with rare bacteria, MH and pyuria.   Past Medical History Problems  1. History of arthritis (V13.4) 2. History of depression (V11.8) 3. History of hypercholesterolemia (V12.29)  Surgical History Problems  1. History of Cholecystectomy 2. History of Hernia Repair Inguinal Bilateral 3. History of Knee Arthroscopy  Current Meds 1. Aleve TABS; as needed per pt;  Therapy: (Recorded:27May2015) to Recorded 2. Cialis 20 MG Oral Tablet;  Therapy: (Recorded:08May2015) to Recorded 3. Meloxicam 15 MG Oral Tablet;  Therapy: (Recorded:08May2015) to Recorded 4. Tamsulosin HCl - 0.4 MG Oral Capsule; TAKE 1 CAPSULE Daily;  Therapy: [id number] to (Evaluate:07Jul2015)  Requested for: 53IRW4315; Last  QM:08QPY1950 Ordered  Allergies Medication  1. No Known Drug Allergies  Family History Problems  1. Family history of prostate cancer (D32.67) : Father  Social History Problems  1. Alcohol use (V49.89)   1 a day 2. Caffeine use (V49.89)   2-3 a day 3. Former smoker (V15.82) 4. Number of children   1 daughter 5. Retired 50. Widowed  Vitals Vital Signs [Data Includes: Last 1 Day]  Recorded: 12WPY0998 12:46PM  Blood Pressure: 158 / 93 Temperature: 97.8  F Heart Rate: 67  Results/Data Urine [Data Includes: Last 1 Day]   33ASN0539  COLOR YELLOW   APPEARANCE CLEAR   SPECIFIC GRAVITY 1.010   pH 6.5   GLUCOSE NEG mg/dL  BILIRUBIN NEG   KETONE NEG mg/dL  BLOOD TRACE   PROTEIN NEG mg/dL  UROBILINOGEN 0.2 mg/dL  NITRITE NEG   LEUKOCYTE ESTERASE NEG   SQUAMOUS EPITHELIAL/HPF FEW   WBC 21-50 WBC/hpf  RBC 3-6 RBC/hpf  BACTERIA RARE   CRYSTALS NONE SEEN   CASTS NONE SEEN    Procedure KUB -stone made some progression, remains in the right proximal ureter. Multiple pelvic phleboliths. Normal bony bowel gas pattern. Comparison to May 2015 CT and KUB.   Marland Kitchen     Assessment Assessed  1. Ureteral stone (592.1)  Plan Health Maintenance  1. UA With REFLEX; [Do Not Release]; Status:Resulted - Requires Verification;   Done:  76BHA1937 12:33PM Ureteral stone  2. Follow-up Schedule Surgery Office  Follow-up  Status: Hold For - Appointment   Requested for: [id number] 3. KUB; Status:Resulted - Requires Verification;   Done: 32DJM4268 12:00AM 4. URINE CULTURE; Status:Hold For - Specimen/Data Collection,Appointment; Requested  for:27May2015;   Discussion/Summary          ureteral stone - I sent urine for culture. We discussed the nature risks, benefits of continued stone passage, right shockwave lithotripsy, right ureteroscopy. All questions answered. He elects to proceed with shockwave lithotripsy. We discussed risks such as failure to fragment, failure to pass the fragments, need for  staged procedure, bleeding among others.      Signatures Electronically signed by : Festus Aloe, M.D.; Dec 12 2013  1:35PM EST  Urine Cx = No growth

## 2013-12-19 ENCOUNTER — Ambulatory Visit (INDEPENDENT_AMBULATORY_CARE_PROVIDER_SITE_OTHER): Payer: Medicare Other | Admitting: Family Medicine

## 2013-12-19 ENCOUNTER — Encounter: Payer: Self-pay | Admitting: Family Medicine

## 2013-12-19 VITALS — BP 150/90 | HR 72 | Wt 235.0 lb

## 2013-12-19 DIAGNOSIS — I10 Essential (primary) hypertension: Secondary | ICD-10-CM | POA: Diagnosis not present

## 2013-12-19 MED ORDER — LOSARTAN POTASSIUM 50 MG PO TABS: 50.0000 mg | ORAL_TABLET | Freq: Every day | ORAL | Status: DC

## 2013-12-19 NOTE — Patient Instructions (Signed)

## 2013-12-19 NOTE — Progress Notes (Signed)
   Subjective:    Patient ID: Lee Smith, male    DOB: Nov 23, 1945, 68 y.o.   MRN: [id number]  Hypertension Pertinent negatives include no chest pain, headaches, palpitations or shortness of breath.   Patient seen for evaluation elevated blood pressure. Never treated for hypertension but elevated off and on in the past. Recently went for urologic procedure and had a pressure reading around 160/100. He brings in a long of several readings over the past several days. Fairly consistently around 160/95. Had some increased malaise but no headache. No chest pains. No dyspnea. No peripheral edema.  He does take nonsteroidal occasionally but has been off meloxicam for the past several days and had persistent elevated readings. He drinks occasional wine but usually no more than about 10 ounces per day. He has had some recent mild weight gain and hopes to start more consistent exercise soon.  Past Medical History  Diagnosis Date  . Allergy   . Hypertension   . Hyperlipidemia   . Genital warts   . Hx of colonic polyps   . Depression     on antidepressants after wife died. Off them now.  . History of kidney stones 11/2013    lithotripsy for right ureteral stone  . Arthritis     bilateral knees   Past Surgical History  Procedure Laterality Date  . Cholecystectomy  2002    [place]  . Hernia repair Bilateral 2003    [place]    reports that he quit smoking about 43 years ago. His smoking use included Cigarettes. He has a 6 pack-year smoking history. He has quit using smokeless tobacco. His smokeless tobacco use included Chew. He reports that he does not drink alcohol or use illicit drugs. family history includes Arthritis in an other family member; Cancer in his father and sister; Hyperlipidemia in an other family member; Hypertension in an other family member. No Known Allergies    Review of Systems  Constitutional: Positive for fatigue. Negative for fever and chills.  Eyes: Negative for visual  disturbance.  Respiratory: Negative for cough, chest tightness and shortness of breath.   Cardiovascular: Negative for chest pain, palpitations and leg swelling.  Neurological: Negative for dizziness, syncope, weakness, light-headedness and headaches.       Objective:   Physical Exam  Constitutional: He is oriented to person, place, and time. He appears well-developed and well-nourished.  HENT:  Right Ear: External ear normal.  Left Ear: External ear normal.  Mouth/Throat: Oropharynx is clear and moist.  Eyes: Pupils are equal, round, and reactive to light.  Neck: Neck supple. No thyromegaly present.  Cardiovascular: Normal rate and regular rhythm.   Pulmonary/Chest: Effort normal and breath sounds normal. No respiratory distress. He has no wheezes. He has no rales.  Musculoskeletal: He exhibits no edema.  Neurological: He is alert and oriented to person, place, and time.          Assessment & Plan:  Hypertension. Currently untreated. He has had several days assistant elevated readings involving multiple blood pressure cuffs. We discussed lifestyle management. Work on weight loss. Establish more consistent aerobic exercise. Start losartan 50 mg once daily. Reassess in 3 weeks

## 2013-12-19 NOTE — Progress Notes (Signed)
Pre visit review using our clinic review tool, if applicable. No additional management support is needed unless otherwise documented below in the visit note. 

## 2014-01-02 DIAGNOSIS — N201 Calculus of ureter: Secondary | ICD-10-CM | POA: Diagnosis not present

## 2014-01-07 ENCOUNTER — Other Ambulatory Visit: Payer: Self-pay | Admitting: Urology

## 2014-01-08 ENCOUNTER — Ambulatory Visit (INDEPENDENT_AMBULATORY_CARE_PROVIDER_SITE_OTHER): Payer: Medicare Other | Admitting: Family Medicine

## 2014-01-08 ENCOUNTER — Encounter: Payer: Self-pay | Admitting: Family Medicine

## 2014-01-08 VITALS — BP 132/90 | HR 64 | Wt 232.0 lb

## 2014-01-08 DIAGNOSIS — I10 Essential (primary) hypertension: Secondary | ICD-10-CM

## 2014-01-08 NOTE — Patient Instructions (Signed)
Monitor blood pressure and be in touch if consistently > 150/90 

## 2014-01-08 NOTE — Progress Notes (Signed)
Pre visit review using our clinic review tool, if applicable. No additional management support is needed unless otherwise documented below in the visit note. 

## 2014-01-08 NOTE — Progress Notes (Signed)
   Subjective:    Patient ID: Lee Smith, male    DOB: [birth year], 68 y.o.   MRN: [id number]  HPI Followup hypertension. We initiated losartan 50 mg once daily. Home blood pressures mostly 130s over 80s to 90. No headaches. No dizziness. Compliant with therapy. No side effects from medication. He started back exercising and has lost 3 pounds and hopes to lose 20 pounds more  Past Medical History  Diagnosis Date  . Allergy   . Hypertension   . Hyperlipidemia   . Genital warts   . Hx of colonic polyps   . Depression     on antidepressants after wife died. Off them now.  . History of kidney stones 11/2013    lithotripsy for right ureteral stone  . Arthritis     bilateral knees   Past Surgical History  Procedure Laterality Date  . Cholecystectomy  2002    [place]  . Hernia repair Bilateral 2003    [place]    reports that he quit smoking about 43 years ago. His smoking use included Cigarettes. He has a 6 pack-year smoking history. He has quit using smokeless tobacco. His smokeless tobacco use included Chew. He reports that he does not drink alcohol or use illicit drugs. family history includes Arthritis in an other family member; Cancer in his father and sister; Hyperlipidemia in an other family member; Hypertension in an other family member. No Known Allergies    Review of Systems  Constitutional: Negative for fatigue and unexpected weight change.  Eyes: Negative for visual disturbance.  Respiratory: Negative for cough, chest tightness and shortness of breath.   Cardiovascular: Negative for chest pain, palpitations and leg swelling.  Neurological: Negative for dizziness, syncope, weakness, light-headedness and headaches.       Objective:   Physical Exam  Constitutional: He is oriented to person, place, and time. He appears well-developed and well-nourished.  HENT:  Right Ear: External ear normal.  Left Ear: External ear normal.  Mouth/Throat: Oropharynx is clear and  moist.  Eyes: Pupils are equal, round, and reactive to light.  Neck: Neck supple. No thyromegaly present.  Cardiovascular: Normal rate and regular rhythm.   Pulmonary/Chest: Effort normal and breath sounds normal. No respiratory distress. He has no wheezes. He has no rales.  Musculoskeletal: He exhibits no edema.  Neurological: He is alert and oriented to person, place, and time.          Assessment & Plan:  Hypertension. Improved. Continue losartan 50 mg once daily. Continue weight loss efforts. Reassess 3 months.

## 2014-01-10 ENCOUNTER — Telehealth: Payer: Self-pay | Admitting: Family Medicine

## 2014-01-10 NOTE — Telephone Encounter (Signed)
Pt informed

## 2014-01-10 NOTE — Telephone Encounter (Signed)
Increase Losartan to 100 mg once daily.

## 2014-01-10 NOTE — Telephone Encounter (Signed)
Pt called to say that his blood pressure is consistently high and was told by Dr Elease Hashimoto to call and let him know if it is still running high because he would increase his bp medicine. Would like a call back to let him know if he should increase dosage

## 2014-01-10 NOTE — Telephone Encounter (Signed)
Left message for patient to return call.

## 2014-01-14 ENCOUNTER — Telehealth: Payer: Self-pay | Admitting: Family Medicine

## 2014-01-14 MED ORDER — LOSARTAN POTASSIUM 100 MG PO TABS: 100.0000 mg | ORAL_TABLET | Freq: Every day | ORAL | Status: DC

## 2014-01-14 NOTE — Telephone Encounter (Signed)
rx sent to pharmacy and pt is aware. 

## 2014-01-14 NOTE — Telephone Encounter (Signed)
Losartan 100 mg once daily 

## 2014-01-14 NOTE — Telephone Encounter (Signed)
Pt was told to take losartan 50 mg twice a day and that works. Pt would like a new rx losartan 50 mg twice a day or losartan 100 mg once a day. Pt will let md decide what he want pt to do either is fine with pt, [facility]. Pt would like a 30 day supply with refills

## 2014-01-15 DIAGNOSIS — IMO0002 Reserved for concepts with insufficient information to code with codable children: Secondary | ICD-10-CM | POA: Diagnosis not present

## 2014-01-15 DIAGNOSIS — M1710 Unilateral primary osteoarthritis, unspecified knee: Secondary | ICD-10-CM | POA: Diagnosis not present

## 2014-02-05 ENCOUNTER — Encounter (HOSPITAL_BASED_OUTPATIENT_CLINIC_OR_DEPARTMENT_OTHER): Payer: Self-pay | Admitting: *Deleted

## 2014-02-06 ENCOUNTER — Encounter (HOSPITAL_BASED_OUTPATIENT_CLINIC_OR_DEPARTMENT_OTHER): Payer: Self-pay | Admitting: *Deleted

## 2014-02-07 ENCOUNTER — Encounter (HOSPITAL_BASED_OUTPATIENT_CLINIC_OR_DEPARTMENT_OTHER): Payer: Self-pay | Admitting: *Deleted

## 2014-02-07 NOTE — Progress Notes (Signed)
Pt instructed npo p mn x cozaar w sip of water.  May take zofran and percocet if needed.  To [facility] 7/28 @ 0600.  Needs istat and ? ekg.

## 2014-02-11 NOTE — H&P (Signed)
  Active Problems Lee Smith presents today for a stone follow up   History of Present Illness Lee Smith has a 7 mm right ureteral stone detected per CT in May 2015. He has been unable to pass this and was scheduled for ESWL however due to a very high blood pressure this procedure was cancelled. He presents today in follow up. He has not passed the stone. He has only had a few painful episodes since the lithotripsy attempt. His pain has been relieved with a heating pad. He has not needed pain medication but he does have this on hand. He denies hematuria, n/v or f/c.   Vitals Vital Signs [Data Includes: Last 1 Day]  Recorded: [id number] 08:33AM  Blood Pressure: 162 / 90 Temperature: 98.3 F Heart Rate: 62  Physical Exam Constitutional: Well nourished and well developed . No acute distress.  Abdomen: The abdomen is soft and nontender. No CVA tenderness.    Results/Data Urine [Data Includes: Last 1 Day]   [id number]  COLOR YELLOW   APPEARANCE CLEAR   SPECIFIC GRAVITY 1.025   pH 6.0   GLUCOSE NEG mg/dL  BILIRUBIN NEG   KETONE NEG mg/dL  BLOOD TRACE   PROTEIN NEG mg/dL  UROBILINOGEN 0.2 mg/dL  NITRITE NEG   LEUKOCYTE ESTERASE NEG   SQUAMOUS EPITHELIAL/HPF FEW   WBC NONE SEEN WBC/hpf  RBC 3-6 RBC/hpf  BACTERIA NONE SEEN   CRYSTALS NONE SEEN   CASTS NONE SEEN    The following images/tracing/specimen were independently visualized: Marland Kitchen KUB: stable large right ureteral stone, stable bilateral non obstructing punctate stones seen    Assessment Assessed  1. Ureteral stone (592.1)  Lee Smith's right ureteral stone is seen on KUB today. He certainly has not passed this although he is mostly asymptomatic. He states today that he is ready to be "done" though. I am not sure he will be able to pass this due to size and I think its likely Dr. Junious Silk will reschedule a procedure since he is now on BP medication per his PCP. We discussed ESWL as well as the risks and benefits of ureteroscopy,  stone extraction, and possible stenting. He would like ureteroscopy again so he can just "be done". I will pass this message along to Dr. Junious Silk so that we can hopefully get him scheduled in the near future. He knows to call us should he have pain or n/v uncontrolled with medication or a temp of 100.5 or greater.   Plan Health Maintenance  1. UA With REFLEX; [Do Not Release]; Status:Complete;   Done: [id number] 08:13AM  1. Schedule stone procedure for 7 mm right ureteral stone   Signatures Electronically signed by : Anders Grant, NP-C; Jan 02 2014  9:06AM EST

## 2014-02-12 ENCOUNTER — Encounter (HOSPITAL_BASED_OUTPATIENT_CLINIC_OR_DEPARTMENT_OTHER): Payer: Self-pay | Admitting: *Deleted

## 2014-02-12 ENCOUNTER — Ambulatory Visit (HOSPITAL_COMMUNITY): Payer: Medicare Other

## 2014-02-12 ENCOUNTER — Ambulatory Visit (HOSPITAL_BASED_OUTPATIENT_CLINIC_OR_DEPARTMENT_OTHER)
Admission: RE | Admit: 2014-02-12 | Discharge: 2014-02-12 | Disposition: A | Discharge status: Home / Self Care | Payer: Medicare Other | Source: Ambulatory Visit | Attending: Urology | Admitting: Urology

## 2014-02-12 ENCOUNTER — Encounter (HOSPITAL_BASED_OUTPATIENT_CLINIC_OR_DEPARTMENT_OTHER): Payer: Medicare Other | Admitting: Anesthesiology

## 2014-02-12 ENCOUNTER — Encounter (HOSPITAL_BASED_OUTPATIENT_CLINIC_OR_DEPARTMENT_OTHER)
Admission: RE | Disposition: A | Discharge status: Home / Self Care | Payer: Self-pay | Source: Ambulatory Visit | Attending: Urology

## 2014-02-12 ENCOUNTER — Ambulatory Visit (HOSPITAL_BASED_OUTPATIENT_CLINIC_OR_DEPARTMENT_OTHER): Payer: Medicare Other | Admitting: Anesthesiology

## 2014-02-12 DIAGNOSIS — I498 Other specified cardiac arrhythmias: Secondary | ICD-10-CM | POA: Diagnosis not present

## 2014-02-12 DIAGNOSIS — N1330 Unspecified hydronephrosis: Secondary | ICD-10-CM | POA: Diagnosis not present

## 2014-02-12 DIAGNOSIS — Z87891 Personal history of nicotine dependence: Secondary | ICD-10-CM | POA: Insufficient documentation

## 2014-02-12 DIAGNOSIS — Z01818 Encounter for other preprocedural examination: Secondary | ICD-10-CM | POA: Diagnosis not present

## 2014-02-12 DIAGNOSIS — N201 Calculus of ureter: Secondary | ICD-10-CM | POA: Diagnosis not present

## 2014-02-12 DIAGNOSIS — I10 Essential (primary) hypertension: Secondary | ICD-10-CM | POA: Insufficient documentation

## 2014-02-12 HISTORY — DX: Allergic rhinitis, unspecified: J30.9

## 2014-02-12 HISTORY — PX: CYSTOSCOPY W/ URETERAL STENT PLACEMENT: SHX1429

## 2014-02-12 HISTORY — PX: CYSTOSCOPY WITH RETROGRADE PYELOGRAM, URETEROSCOPY AND STENT PLACEMENT: SHX5789

## 2014-02-12 LAB — POCT I-STAT 4, (NA,K, GLUC, HGB,HCT)
Glucose, Bld: 99 mg/dL (ref 70–99)
HEMATOCRIT: 39 % (ref 39.0–52.0)
Hemoglobin: 13.3 g/dL (ref 13.0–17.0)
Potassium: 3.8 mEq/L (ref 3.7–5.3)
Sodium: 143 mEq/L (ref 137–147)

## 2014-02-12 SURGERY — CYSTOURETEROSCOPY, WITH RETROGRADE PYELOGRAM AND STENT INSERTION
Anesthesia: General | Site: Ureter | Laterality: Right

## 2014-02-12 MED ORDER — ONDANSETRON HCL 4 MG/2ML IJ SOLN
INTRAMUSCULAR | Status: DC | PRN
  Administered 2014-02-12: 4 mg via INTRAVENOUS

## 2014-02-12 MED ORDER — MIDAZOLAM HCL 5 MG/5ML IJ SOLN
INTRAMUSCULAR | Status: DC | PRN
  Administered 2014-02-12: 2 mg via INTRAVENOUS

## 2014-02-12 MED ORDER — PROMETHAZINE HCL 25 MG/ML IJ SOLN
6.2500 mg | INTRAMUSCULAR | Status: DC | PRN
  Filled 2014-02-12: qty 1

## 2014-02-12 MED ORDER — FENTANYL CITRATE 0.05 MG/ML IJ SOLN
INTRAMUSCULAR | Status: AC
  Filled 2014-02-12: qty 4

## 2014-02-12 MED ORDER — BELLADONNA ALKALOIDS-OPIUM 16.2-60 MG RE SUPP
RECTAL | Status: AC
  Filled 2014-02-12: qty 1

## 2014-02-12 MED ORDER — LIDOCAINE HCL 2 % EX GEL
CUTANEOUS | Status: DC | PRN
  Administered 2014-02-12: 1 via URETHRAL

## 2014-02-12 MED ORDER — FENTANYL CITRATE 0.05 MG/ML IJ SOLN
25.0000 ug | INTRAMUSCULAR | Status: DC | PRN
  Filled 2014-02-12: qty 1

## 2014-02-12 MED ORDER — AMPICILLIN-SULBACTAM SODIUM 1.5 (1-0.5) G IJ SOLR
INTRAMUSCULAR | Status: AC
  Filled 2014-02-12: qty 1.5

## 2014-02-12 MED ORDER — DEXAMETHASONE SODIUM PHOSPHATE 4 MG/ML IJ SOLN
INTRAMUSCULAR | Status: DC | PRN
  Administered 2014-02-12: 10 mg via INTRAVENOUS

## 2014-02-12 MED ORDER — FENTANYL CITRATE 0.05 MG/ML IJ SOLN
INTRAMUSCULAR | Status: DC | PRN
  Administered 2014-02-12: 100 ug via INTRAVENOUS
  Administered 2014-02-12: 50 ug via INTRAVENOUS

## 2014-02-12 MED ORDER — LIDOCAINE HCL (CARDIAC) 20 MG/ML IV SOLN
INTRAVENOUS | Status: DC | PRN
  Administered 2014-02-12: 100 mg via INTRAVENOUS

## 2014-02-12 MED ORDER — MIDAZOLAM HCL 2 MG/2ML IJ SOLN
INTRAMUSCULAR | Status: AC
  Filled 2014-02-12: qty 2

## 2014-02-12 MED ORDER — BELLADONNA ALKALOIDS-OPIUM 16.2-60 MG RE SUPP
RECTAL | Status: DC | PRN
  Administered 2014-02-12: 1 via RECTAL

## 2014-02-12 MED ORDER — SODIUM CHLORIDE 0.9 % IR SOLN
Status: DC | PRN
  Administered 2014-02-12: 4000 mL

## 2014-02-12 MED ORDER — KETOROLAC TROMETHAMINE 30 MG/ML IJ SOLN
INTRAMUSCULAR | Status: DC | PRN
  Administered 2014-02-12: 30 mg via INTRAVENOUS

## 2014-02-12 MED ORDER — LACTATED RINGERS IV SOLN
INTRAVENOUS | Status: DC
Start: 2014-02-12 — End: 2014-02-12
  Administered 2014-02-12 (×2): via INTRAVENOUS
  Filled 2014-02-12: qty 1000

## 2014-02-12 MED ORDER — ACETAMINOPHEN 10 MG/ML IV SOLN
INTRAVENOUS | Status: DC | PRN
  Administered 2014-02-12: 1000 mg via INTRAVENOUS

## 2014-02-12 MED ORDER — IOHEXOL 350 MG/ML SOLN
INTRAVENOUS | Status: DC | PRN
  Administered 2014-02-12: 11 mL

## 2014-02-12 MED ORDER — KETOROLAC TROMETHAMINE 30 MG/ML IJ SOLN
15.0000 mg | Freq: Once | INTRAMUSCULAR | Status: DC | PRN
  Filled 2014-02-12: qty 1

## 2014-02-12 MED ORDER — PROPOFOL 10 MG/ML IV BOLUS
INTRAVENOUS | Status: DC | PRN
  Administered 2014-02-12: 180 mg via INTRAVENOUS
  Administered 2014-02-12: 20 mg via INTRAVENOUS

## 2014-02-12 MED ORDER — SODIUM CHLORIDE 0.9 % IV SOLN
1.5000 g | INTRAVENOUS | Status: AC
  Administered 2014-02-12 (×2): 1.5 g via INTRAVENOUS
  Filled 2014-02-12: qty 1.5

## 2014-02-12 SURGICAL SUPPLY — 41 items
ADAPTER CATH URET PLST 4-6FR (CATHETERS) IMPLANT
BAG DRAIN URO-CYSTO SKYTR STRL (DRAIN) ×3 IMPLANT
BASKET LASER NITINOL 1.9FR (BASKET) IMPLANT
BASKET STNLS GEMINI 4WIRE 3FR (BASKET) IMPLANT
BASKET ZERO TIP NITINOL 2.4FR (BASKET) IMPLANT
CANISTER SUCT LVC 12 LTR MEDI- (MISCELLANEOUS) ×3 IMPLANT
CATH INTERMIT  6FR 70CM (CATHETERS) ×3 IMPLANT
CATH URET 5FR 28IN CONE TIP (BALLOONS)
CATH URET 5FR 28IN OPEN ENDED (CATHETERS) IMPLANT
CATH URET 5FR 70CM CONE TIP (BALLOONS) IMPLANT
CLOTH BEACON ORANGE TIMEOUT ST (SAFETY) ×3 IMPLANT
DRAPE CAMERA CLOSED 9X96 (DRAPES) IMPLANT
ELECT REM PT RETURN 9FT ADLT (ELECTROSURGICAL)
ELECTRODE REM PT RTRN 9FT ADLT (ELECTROSURGICAL) IMPLANT
FIBER LASER FLEXIVA 200 (UROLOGICAL SUPPLIES) IMPLANT
FIBER LASER FLEXIVA 365 (UROLOGICAL SUPPLIES) IMPLANT
GLOVE BIO SURGEON STRL SZ7.5 (GLOVE) ×3 IMPLANT
GLOVE BIOGEL M STER SZ 6 (GLOVE) ×3 IMPLANT
GLOVE BIOGEL PI IND STRL 6.5 (GLOVE) ×4 IMPLANT
GLOVE BIOGEL PI INDICATOR 6.5 (GLOVE) ×2
GOWN PREVENTION PLUS LG XLONG (DISPOSABLE) IMPLANT
GOWN STRL REIN XL XLG (GOWN DISPOSABLE) IMPLANT
GOWN STRL REUS W/TWL LRG LVL3 (GOWN DISPOSABLE) ×3 IMPLANT
GOWN STRL REUS W/TWL XL LVL3 (GOWN DISPOSABLE) ×3 IMPLANT
GUIDEWIRE 0.038 PTFE COATED (WIRE) ×3 IMPLANT
GUIDEWIRE ANG ZIPWIRE 038X150 (WIRE) IMPLANT
GUIDEWIRE STR DUAL SENSOR (WIRE) ×6 IMPLANT
IV NS 1000ML (IV SOLUTION) ×1
IV NS 1000ML BAXH (IV SOLUTION) ×2 IMPLANT
IV NS IRRIG 3000ML ARTHROMATIC (IV SOLUTION) ×3 IMPLANT
KIT BALLIN UROMAX 15FX10 (LABEL) IMPLANT
KIT BALLN UROMAX 15FX4 (MISCELLANEOUS) IMPLANT
KIT BALLN UROMAX 26 75X4 (MISCELLANEOUS)
PACK CYSTOSCOPY (CUSTOM PROCEDURE TRAY) ×3 IMPLANT
SET HIGH PRES BAL DIL (LABEL)
SHEATH ACCESS URETERAL 38CM (SHEATH) ×3 IMPLANT
SHEATH ACCESS URETERAL 54CM (SHEATH) IMPLANT
SHEATH URET ACCESS 12FR/35CM (UROLOGICAL SUPPLIES) IMPLANT
SHEATH URET ACCESS 12FR/55CM (UROLOGICAL SUPPLIES) IMPLANT
STENT URET 6FRX28 CONTOUR (STENTS) ×3 IMPLANT
SYRINGE IRR TOOMEY STRL 70CC (SYRINGE) IMPLANT

## 2014-02-12 NOTE — Anesthesia Postprocedure Evaluation (Signed)
  Anesthesia Post-op Note  Patient: Lee Smith  Procedure(s) Performed: Procedure(s) (LRB): RIGHT URETEROSCOPY  (Right) CYSTOSCOPY WITH RETROGRADE PYELOGRAM/URETERAL STENT PLACEMENT (Right)  Patient Location: PACU  Anesthesia Type: General  Level of Consciousness: awake and alert   Airway and Oxygen Therapy: Patient Spontanous Breathing  Post-op Pain: mild  Post-op Assessment: Post-op Vital signs reviewed, Patient's Cardiovascular Status Stable, Respiratory Function Stable, Patent Airway and No signs of Nausea or vomiting  Last Vitals:  Filed Vitals:   02/12/14 0859  BP: 153/96  Pulse: 63  Temp: 36.3 C  Resp: 11    Post-op Vital Signs: stable   Complications: No apparent anesthesia complications

## 2014-02-12 NOTE — Discharge Instructions (Signed)
Ureteral Stent Implantation, Care After Refer to this sheet in the next few weeks. These instructions provide you with information on caring for yourself after your procedure. Your health care provider may also give you more specific instructions. Your treatment has been planned according to current medical practices, but problems sometimes occur. Call your health care provider if you have any problems or questions after your procedure. WHAT TO EXPECT AFTER THE PROCEDURE You should be back to normal activity within 48 hours after the procedure. Nausea and vomiting may occur and are commonly the result of anesthesia. It is common to experience sharp pain in the back or lower abdomen and penis with voiding. This is caused by movement of the ends of the stent with the act of urinating.It usually goes away within minutes after you have stopped urinating. HOME CARE INSTRUCTIONS Make sure to drink plenty of fluids. You may have small amounts of bleeding, causing your urine to be red. This is normal. Certain movements may trigger pain or a feeling that you need to urinate. You may be given medicines to prevent infection or bladder spasms. Be sure to take all medicines as directed. Only take over-the-counter or prescription medicines for pain, discomfort, or fever as directed by your health care provider. Do not take aspirin, as this can make bleeding worse. Your stent will be left in until the blockage is resolved. This may take 2 weeks or longer, depending on the reason for stent implantation. You may have an X-ray exam to make sure your ureter is open and that the stent has not moved out of position (migrated). The stent can be removed by your health care provider in the office. Medicines may be given for comfort while the stent is being removed. Be sure to keep all follow-up appointments so your health care provider can check that you are healing properly. SEEK MEDICAL CARE IF:  You experience increasing  pain.  Your pain medicine is not working. SEEK IMMEDIATE MEDICAL CARE IF:  Your urine is dark red or has blood clots.  You are leaking urine (incontinent).  You have a fever, chills, feeling sick to your stomach (nausea), or vomiting.  Your pain is not relieved by pain medicine.  The end of the stent comes out of the urethra.  You are unable to urinate. Document Released: 03/07/2013 Document Revised: 07/10/2013 Document Reviewed: 03/07/2013 [facility] Patient Information 2015 [place], [place]. This information is not intended to replace advice given to you by your health care provider. Make sure you discuss any questions you have with  Post Anesthesia Home Care Instructions  Activity: Get plenty of rest for the remainder of the day. A responsible adult should stay with you for 24 hours following the procedure.  For the next 24 hours, DO NOT: -Drive a car -Paediatric nurse -Drink alcoholic beverages -Take any medication unless instructed by your physician -Make any legal decisions or sign important papers.  Meals: Start with liquid foods such as gelatin or soup. Progress to regular foods as tolerated. Avoid greasy, spicy, heavy foods. If nausea and/or vomiting occur, drink only clear liquids until the nausea and/or vomiting subsides. Call your physician if vomiting continues.  Special Instructions/Symptoms: Your throat may feel dry or sore from the anesthesia or the breathing tube placed in your throat during surgery. If this causes discomfort, gargle with warm salt water. The discomfort should disappear within 24 hours. your health care provider.

## 2014-02-12 NOTE — Anesthesia Preprocedure Evaluation (Addendum)
Anesthesia Evaluation  Patient identified by MRN, date of birth, ID band Patient awake    Reviewed: Allergy & Precautions, H&P , NPO status , Patient's Chart, lab work & pertinent test results  Airway Mallampati: II TM Distance: <3 FB Neck ROM: Full    Dental no notable dental hx. (+) Dental Advisory Given, Caps, Implants,    Pulmonary neg pulmonary ROS, former smoker,  breath sounds clear to auscultation  Pulmonary exam normal       Cardiovascular Exercise Tolerance: Good hypertension, Pt. on medications Rhythm:Regular Rate:Bradycardia     Neuro/Psych negative neurological ROS  negative psych ROS   GI/Hepatic negative GI ROS, Neg liver ROS,   Endo/Other  negative endocrine ROS  Renal/GU negative Renal ROS  negative genitourinary   Musculoskeletal negative musculoskeletal ROS (+)   Abdominal   Peds negative pediatric ROS (+)  Hematology negative hematology ROS (+)   Anesthesia Other Findings   Reproductive/Obstetrics negative OB ROS                        Anesthesia Physical Anesthesia Plan  ASA: II  Anesthesia Plan: General   Post-op Pain Management:    Induction: Intravenous  Airway Management Planned: LMA  Additional Equipment:   Intra-op Plan:   Post-operative Plan:   Informed Consent: I have reviewed the patients History and Physical, chart, labs and discussed the procedure including the risks, benefits and alternatives for the proposed anesthesia with the patient or authorized representative who has indicated his/her understanding and acceptance.   Dental advisory given  Plan Discussed with: CRNA and Surgeon  Anesthesia Plan Comments:         Anesthesia Quick Evaluation

## 2014-02-12 NOTE — Anesthesia Procedure Notes (Signed)
Procedure Name: LMA Insertion Date/Time: 02/12/2014 8:01 AM Performed by: Wanita Chamberlain Pre-anesthesia Checklist: Patient identified, Emergency Drugs available, Suction available, Patient being monitored and Timeout performed Patient Re-evaluated:Patient Re-evaluated prior to inductionPreoxygenation: Pre-oxygenation with 100% oxygen Intubation Type: IV induction Ventilation: Mask ventilation without difficulty LMA: LMA inserted LMA Size: 4.0 Placement Confirmation: positive ETCO2 Tube secured with: Tape Dental Injury: Teeth and Oropharynx as per pre-operative assessment

## 2014-02-12 NOTE — Transfer of Care (Signed)
Immediate Anesthesia Transfer of Care Note  Patient: Lee Smith  Procedure(s) Performed: Procedure(s): RIGHT URETEROSCOPY  (Right) CYSTOSCOPY WITH RETROGRADE PYELOGRAM/URETERAL STENT PLACEMENT (Right)  Patient Location: PACU  Anesthesia Type:General  Level of Consciousness: awake, alert , oriented and patient cooperative  Airway & Oxygen Therapy: Patient Spontanous Breathing and Patient connected to nasal cannula oxygen  Post-op Assessment: Report given to PACU RN and Post -op Vital signs reviewed and stable  Post vital signs: Reviewed and stable  Complications: No apparent anesthesia complications

## 2014-02-12 NOTE — Op Note (Signed)
Preoperative diagnosis: Right ureteral stone Postoperative diagnosis: Right ureteral stone  Procedure: Cystoscopy Right retrograde pyelogram Right ureteroscopy Right ureteral stent placement Exam under anesthesia  Surgeon: Abdul Beirne  Type of anesthesia: Gen.  Findings: On cystoscopy the urethra was normal. The prostatic urethra was slightly elongated with a high median bar. There was no foreign body or stone in the bladder. The bladder mucosa appeared normal.  Right retrograde pyelogram-this outlined a single ureter single collecting system unit. In the proximal ureter there was a filling defects in proximal to this mild to moderate hydroureteronephrosis of the proximal ureter collecting system and renal pelvis.  Right ureteroscopy revealed an impacted stone in the proximal ureter with narrowing and tightness of the ureter at the level of the stone.  Exam under anesthesia revealed a normal penis without lesion. The testicles were descended bilaterally and palpably normal. On digital rectal exam the prostate was smooth and mildly enlarged. There was no hard area or nodule. Description of procedure: After consent was obtained patient brought to the operating room. After adequate anesthesia he was placed in lithotomy position and prepped and draped in the usual sterile fashion. A timeout was performed to confirm the patient and procedure. Cystoscope was passed per urethra and the right ureteral orifice cannulated with a 6 [place] open-ended catheter. Retrograde injection of contrast was performed. A sensor wire was then advanced and coiled in the collecting system. I then attempted to pass a semirigid ureteroscope adjacent to the wire but the ureteral orifice was too tight. The ureteral orifice was dilated with the inner cannula of the access sheath without difficulty. Now the semirigid ureteroscope easily advanced into the proximal ureter but was actually too short to reach the stone. Therefore a  second sensor wire was advanced and the semirigid scope backed out. Over the second wire a ureteral access sheath was placed into the proximal ureter without any difficulty. The second wire and inner cannula were removed and a digital ureteroscope was passed. At the level of the stone the ureter folded and narrowed and the stone was not accessible. I passed a second wire through the ureteroscope and tried to manipulate the scope into proximity of the stone to no avail. I tried to gently advance the scope turned the scope back out the access sheath and other manipulations to straighten the ureter but the stone never became visible. Therefore I decided was best to leave a stent and allow some passive dilation and drainage of the system. The access sheath was backed out on the ureteroscope and the ureter carefully examined and noted to be free of stone or injury. Over the wire the 6 [place] open-ended catheter was passed again. A 6 French open-ended catheter and the met resistance at the stone and slowly past the stone up into the region of the renal pelvis. I then removed the wire and there was a good hydronephrotic drip. Second injection of contrast again outlined a dilated collecting system and renal pelvis confirming placement of the wire in the collecting system. The wire was replaced and coiled in the midpole and renal pelvis. The 6 [place] open-ended catheter was removed and the wire was backloaded on the cystoscope. The cystoscope was advanced with the right ureteral orifice in view and a 6 x 28 cm stent was advanced. The wire was removed with an excellent coil in the renal pelvis and an excellent coil in the bladder reconstituted. The bladder was drained and the scope removed. Lidocaine jelly was instilled per urethra. An  exam under anesthesia was performed. The patient was awakened and taken to the recovery room in stable condition.  Complications: None Blood loss: Minimal Specimens: None  Drains: 6 x 28  cm right ureteral stent  Disposition: Patient stable to PACU. I'll plan to bring him back in 2-3 weeks for right ureteroscopy with attempted holmium laser lithotripsy.

## 2014-02-12 NOTE — Interval H&P Note (Signed)
History and Physical Interval Note:  02/12/2014 7:45 AM  Lee Smith  has presented today for surgery, with the diagnosis of RIGHT URETERAL STONE  The various methods of treatment have been discussed with the patient and family. After consideration of risks, benefits and other options for treatment, the patient has consented to  Procedure(s): RIGHT URETEROSCOPY AND STENT PLACEMENT (Right) RIGHT HOLMIUM LASER APPLICATION (Right) as a surgical intervention .  The patient's history has been reviewed, patient examined, no change in status, stable for surgery.  I have reviewed the patient's chart and labs.  Questions were answered to the patient's satisfaction.  Patient was wondering if the stone had passed.  He continues to have some mild intermittent right flank pain and has not seen a stone pass.  We discussed a repeat preoperative KUB and he elected to proceed.  I told him we would do fluoroscopy in the operating room but after consideration he didn't want to check a KUB.  A KUB was done which shows the stone remains in the right proximal ureter.  I discussed the nature risks benefits and alternatives of ureteroscopy laser lithotripsy and stent with the patient and his wife.  We discussed he may need a staged procedure.  The patient's been well without chest pain or shortness of breath.  He started on a blood pressure medicine.  He's had no fevers chills or dysuria.  He elects to proceed.   Festus Aloe

## 2014-02-13 ENCOUNTER — Encounter (HOSPITAL_BASED_OUTPATIENT_CLINIC_OR_DEPARTMENT_OTHER): Payer: Self-pay | Admitting: Urology

## 2014-02-14 ENCOUNTER — Other Ambulatory Visit: Payer: Self-pay | Admitting: Urology

## 2014-02-26 ENCOUNTER — Encounter (HOSPITAL_BASED_OUTPATIENT_CLINIC_OR_DEPARTMENT_OTHER): Payer: Self-pay | Admitting: *Deleted

## 2014-02-26 NOTE — Progress Notes (Signed)
To [facility] at 0600- current  lab,Ekg in epic-instructed Npo after Mn-may take cozaar,pain medication in am with small amt water .

## 2014-03-05 ENCOUNTER — Encounter (HOSPITAL_BASED_OUTPATIENT_CLINIC_OR_DEPARTMENT_OTHER): Payer: Medicare Other | Admitting: Anesthesiology

## 2014-03-05 ENCOUNTER — Ambulatory Visit (HOSPITAL_BASED_OUTPATIENT_CLINIC_OR_DEPARTMENT_OTHER)
Admission: RE | Admit: 2014-03-05 | Discharge: 2014-03-05 | Disposition: A | Discharge status: Home / Self Care | Payer: Medicare Other | Source: Ambulatory Visit | Attending: Urology | Admitting: Urology

## 2014-03-05 ENCOUNTER — Encounter (HOSPITAL_BASED_OUTPATIENT_CLINIC_OR_DEPARTMENT_OTHER)
Admission: RE | Disposition: A | Discharge status: Home / Self Care | Payer: Self-pay | Source: Ambulatory Visit | Attending: Urology

## 2014-03-05 ENCOUNTER — Encounter (HOSPITAL_COMMUNITY): Payer: Self-pay | Admitting: Pharmacy Technician

## 2014-03-05 ENCOUNTER — Ambulatory Visit (HOSPITAL_BASED_OUTPATIENT_CLINIC_OR_DEPARTMENT_OTHER): Payer: Medicare Other | Admitting: Anesthesiology

## 2014-03-05 ENCOUNTER — Encounter (HOSPITAL_BASED_OUTPATIENT_CLINIC_OR_DEPARTMENT_OTHER): Payer: Self-pay | Admitting: *Deleted

## 2014-03-05 DIAGNOSIS — F3289 Other specified depressive episodes: Secondary | ICD-10-CM | POA: Diagnosis not present

## 2014-03-05 DIAGNOSIS — N200 Calculus of kidney: Secondary | ICD-10-CM | POA: Diagnosis not present

## 2014-03-05 DIAGNOSIS — F329 Major depressive disorder, single episode, unspecified: Secondary | ICD-10-CM | POA: Diagnosis not present

## 2014-03-05 DIAGNOSIS — Z87891 Personal history of nicotine dependence: Secondary | ICD-10-CM | POA: Diagnosis not present

## 2014-03-05 DIAGNOSIS — N201 Calculus of ureter: Secondary | ICD-10-CM | POA: Insufficient documentation

## 2014-03-05 DIAGNOSIS — I10 Essential (primary) hypertension: Secondary | ICD-10-CM | POA: Diagnosis not present

## 2014-03-05 HISTORY — PX: HOLMIUM LASER APPLICATION: SHX5852

## 2014-03-05 HISTORY — PX: CYSTOSCOPY WITH URETEROSCOPY AND STENT PLACEMENT: SHX6377

## 2014-03-05 LAB — POCT I-STAT, CHEM 8
BUN: 23 mg/dL (ref 6–23)
Calcium, Ion: 1.22 mmol/L (ref 1.13–1.30)
Chloride: 107 mEq/L (ref 96–112)
Creatinine, Ser: 1 mg/dL (ref 0.50–1.35)
Glucose, Bld: 99 mg/dL (ref 70–99)
HCT: 40 % (ref 39.0–52.0)
Hemoglobin: 13.6 g/dL (ref 13.0–17.0)
Potassium: 3.9 mEq/L (ref 3.7–5.3)
SODIUM: 143 meq/L (ref 137–147)
TCO2: 23 mmol/L (ref 0–100)

## 2014-03-05 SURGERY — CYSTOURETEROSCOPY, WITH STENT INSERTION
Anesthesia: General | Site: Ureter | Laterality: Right

## 2014-03-05 MED ORDER — LEVOFLOXACIN IN D5W 500 MG/100ML IV SOLN
500.0000 mg | INTRAVENOUS | Status: AC
  Administered 2014-03-05: 500 mg via INTRAVENOUS
  Filled 2014-03-05: qty 100

## 2014-03-05 MED ORDER — LACTATED RINGERS IV SOLN
INTRAVENOUS | Status: DC
  Administered 2014-03-05 (×2): via INTRAVENOUS
  Filled 2014-03-05: qty 1000

## 2014-03-05 MED ORDER — KETOROLAC TROMETHAMINE 30 MG/ML IJ SOLN
INTRAMUSCULAR | Status: DC | PRN
  Administered 2014-03-05: 30 mg via INTRAVENOUS

## 2014-03-05 MED ORDER — OXYCODONE HCL 5 MG PO TABS
5.0000 mg | ORAL_TABLET | Freq: Once | ORAL | Status: DC | PRN
  Filled 2014-03-05: qty 1

## 2014-03-05 MED ORDER — LIDOCAINE HCL (CARDIAC) 20 MG/ML IV SOLN
INTRAVENOUS | Status: DC | PRN
  Administered 2014-03-05: 60 mg via INTRAVENOUS

## 2014-03-05 MED ORDER — MIDAZOLAM HCL 5 MG/5ML IJ SOLN
INTRAMUSCULAR | Status: DC | PRN
  Administered 2014-03-05: 2 mg via INTRAVENOUS

## 2014-03-05 MED ORDER — CEPHALEXIN 500 MG PO CAPS: 500.0000 mg | ORAL_CAPSULE | Freq: Two times a day (BID) | ORAL | Status: DC

## 2014-03-05 MED ORDER — PROMETHAZINE HCL 25 MG/ML IJ SOLN
6.2500 mg | INTRAMUSCULAR | Status: DC | PRN
  Filled 2014-03-05: qty 1

## 2014-03-05 MED ORDER — OXYCODONE HCL 5 MG/5ML PO SOLN
5.0000 mg | Freq: Once | ORAL | Status: DC | PRN
  Filled 2014-03-05: qty 5

## 2014-03-05 MED ORDER — MIDAZOLAM HCL 2 MG/2ML IJ SOLN
INTRAMUSCULAR | Status: AC
  Filled 2014-03-05: qty 2

## 2014-03-05 MED ORDER — SODIUM CHLORIDE 0.9 % IR SOLN
Status: DC | PRN
  Administered 2014-03-05: 4000 mL

## 2014-03-05 MED ORDER — FENTANYL CITRATE 0.05 MG/ML IJ SOLN
25.0000 ug | INTRAMUSCULAR | Status: DC | PRN
  Filled 2014-03-05: qty 1

## 2014-03-05 MED ORDER — ONDANSETRON HCL 4 MG/2ML IJ SOLN
INTRAMUSCULAR | Status: DC | PRN
  Administered 2014-03-05: 4 mg via INTRAVENOUS

## 2014-03-05 MED ORDER — DEXAMETHASONE SODIUM PHOSPHATE 4 MG/ML IJ SOLN
INTRAMUSCULAR | Status: DC | PRN
Start: 2014-03-05 — End: 2014-03-05
  Administered 2014-03-05: 8 mg via INTRAVENOUS

## 2014-03-05 MED ORDER — FENTANYL CITRATE 0.05 MG/ML IJ SOLN
INTRAMUSCULAR | Status: DC | PRN
  Administered 2014-03-05 (×6): 25 ug via INTRAVENOUS
  Administered 2014-03-05: 50 ug via INTRAVENOUS

## 2014-03-05 MED ORDER — PROPOFOL 10 MG/ML IV BOLUS
INTRAVENOUS | Status: DC | PRN
  Administered 2014-03-05: 200 mg via INTRAVENOUS

## 2014-03-05 MED ORDER — FENTANYL CITRATE 0.05 MG/ML IJ SOLN
INTRAMUSCULAR | Status: AC
  Filled 2014-03-05: qty 6

## 2014-03-05 SURGICAL SUPPLY — 19 items
BAG DRAIN URO-CYSTO SKYTR STRL (DRAIN) ×3 IMPLANT
BASKET ZERO TIP NITINOL 2.4FR (BASKET) ×3 IMPLANT
CANISTER SUCT LVC 12 LTR MEDI- (MISCELLANEOUS) ×3 IMPLANT
CATH INTERMIT  6FR 70CM (CATHETERS) ×3 IMPLANT
CLOTH BEACON ORANGE TIMEOUT ST (SAFETY) ×3 IMPLANT
DRAPE CAMERA CLOSED 9X96 (DRAPES) ×3 IMPLANT
FIBER LASER TRAC TIP (UROLOGICAL SUPPLIES) ×6 IMPLANT
GLOVE BIO SURGEON STRL SZ 6.5 (GLOVE) ×2 IMPLANT
GLOVE BIO SURGEON STRL SZ7.5 (GLOVE) ×3 IMPLANT
GLOVE BIO SURGEONS STRL SZ 6.5 (GLOVE) ×1
GLOVE INDICATOR 6.5 STRL GRN (GLOVE) ×6 IMPLANT
GOWN STRL REUS W/ TWL LRG LVL3 (GOWN DISPOSABLE) ×1 IMPLANT
GOWN STRL REUS W/TWL LRG LVL3 (GOWN DISPOSABLE) ×2
GOWN STRL REUS W/TWL XL LVL3 (GOWN DISPOSABLE) ×3 IMPLANT
GUIDEWIRE STR DUAL SENSOR (WIRE) ×6 IMPLANT
IV NS IRRIG 3000ML ARTHROMATIC (IV SOLUTION) ×6 IMPLANT
PACK CYSTOSCOPY (CUSTOM PROCEDURE TRAY) ×3 IMPLANT
SHEATH ACCESS URETERAL 38CM (SHEATH) ×3 IMPLANT
STENT URET 6FRX28 CONTOUR (STENTS) ×3 IMPLANT

## 2014-03-05 NOTE — Transfer of Care (Signed)
Immediate Anesthesia Transfer of Care Note  Patient: Lee Smith  Procedure(s) Performed: Procedure(s) (LRB): CYSTOSCOPY WITH URETEROSCOPY AND STENT EXCHANGE, STONE EXTRACTION (Right) HOLMIUM LASER APPLICATION (Right)  Patient Location: PACU  Anesthesia Type: General  Level of Consciousness: awake, oriented, sedated and patient cooperative  Airway & Oxygen Therapy: Patient Spontanous Breathing and Patient connected to face mask oxygen  Post-op Assessment: Report given to PACU RN and Post -op Vital signs reviewed and stable  Post vital signs: Reviewed and stable  Complications: No apparent anesthesia complications

## 2014-03-05 NOTE — H&P (View-Only) (Signed)
  Active Problems Lee Smith presents today for a stone follow up   History of Present Illness Lee Smith has a 7 mm right ureteral stone detected per CT in May 2015. He has been unable to pass this and was scheduled for ESWL however due to a very high blood pressure this procedure was cancelled. He presents today in follow up. He has not passed the stone. He has only had a few painful episodes since the lithotripsy attempt. His pain has been relieved with a heating pad. He has not needed pain medication but he does have this on hand. He denies hematuria, n/v or f/c.   Vitals Vital Signs [Data Includes: Last 1 Day]  Recorded: [id number] 08:33AM  Blood Pressure: 162 / 90 Temperature: 98.3 F Heart Rate: 62  Physical Exam Constitutional: Well nourished and well developed . No acute distress.  Abdomen: The abdomen is soft and nontender. No CVA tenderness.    Results/Data Urine [Data Includes: Last 1 Day]   [id number]  COLOR YELLOW   APPEARANCE CLEAR   SPECIFIC GRAVITY 1.025   pH 6.0   GLUCOSE NEG mg/dL  BILIRUBIN NEG   KETONE NEG mg/dL  BLOOD TRACE   PROTEIN NEG mg/dL  UROBILINOGEN 0.2 mg/dL  NITRITE NEG   LEUKOCYTE ESTERASE NEG   SQUAMOUS EPITHELIAL/HPF FEW   WBC NONE SEEN WBC/hpf  RBC 3-6 RBC/hpf  BACTERIA NONE SEEN   CRYSTALS NONE SEEN   CASTS NONE SEEN    The following images/tracing/specimen were independently visualized: Marland Kitchen KUB: stable large right ureteral stone, stable bilateral non obstructing punctate stones seen    Assessment Assessed  1. Ureteral stone (592.1)  Lee Smith right ureteral stone is seen on KUB today. He certainly has not passed this although he is mostly asymptomatic. He states today that he is ready to be "done" though. I am not sure he will be able to pass this due to size and I think its likely Dr. Junious Smith will reschedule a procedure since he is now on BP medication per his PCP. We discussed ESWL as well as the risks and benefits of ureteroscopy,  stone extraction, and possible stenting. He would like ureteroscopy again so he can just "be done". I will pass this message along to Dr. Junious Smith so that we can hopefully get him scheduled in the near future. He knows to call us should he have pain or n/v uncontrolled with medication or a temp of 100.5 or greater.   Plan Health Maintenance  1. UA With REFLEX; [Do Not Release]; Status:Complete;   Done: [id number] 08:13AM  1. Schedule stone procedure for 7 mm right ureteral stone   Signatures Electronically signed by : Anders Grant, NP-C; Jan 02 2014  9:06AM EST

## 2014-03-05 NOTE — Discharge Instructions (Signed)
Ureteral Stent Implantation, Care After Refer to this sheet in the next few weeks. These instructions provide you with information on caring for yourself after your procedure. Your health care provider may also give you more specific instructions. Your treatment has been planned according to current medical practices, but problems sometimes occur. Call your health care provider if you have any problems or questions after your procedure. WHAT TO EXPECT AFTER THE PROCEDURE You should be back to normal activity within 48 hours after the procedure. Nausea and vomiting may occur and are commonly the result of anesthesia. It is common to experience sharp pain in the back or lower abdomen and penis with voiding. This is caused by movement of the ends of the stent with the act of urinating.It usually goes away within minutes after you have stopped urinating. HOME CARE INSTRUCTIONS Make sure to drink plenty of fluids. You may have small amounts of bleeding, causing your urine to be red. This is normal. Certain movements may trigger pain or a feeling that you need to urinate. You may be given medicines to prevent infection or bladder spasms. Be sure to take all medicines as directed. Only take over-the-counter or prescription medicines for pain, discomfort, or fever as directed by your health care provider. Do not take aspirin, as this can make bleeding worse.  Your stent will be left in until the blockage is resolved. This may take 2 weeks or longer, depending on the reason for stent implantation. You may have an X-ray exam to make sure your ureter is open and that the stent has not moved out of position (migrated). The stent can be removed by your health care provider in the office.   SEEK MEDICAL CARE IF:  You experience increasing pain.  Your pain medicine is not working. SEEK IMMEDIATE MEDICAL CARE IF:  Your urine is dark red or has blood clots.  You are leaking urine (incontinent).  You have a  fever, chills, feeling sick to your stomach (nausea), or vomiting.  Your pain is not relieved by pain medicine.  The end of the stent comes out of the urethra.  You are unable to urinate. Document Released: 03/07/2013 Document Revised: 07/10/2013 Document Reviewed: 03/07/2013 [facility] Patient Information 2015 [place], [place]. This information is not intended to replace advice given to you by your health care provider. Make sure you discuss any questions you have with your health care provider.      Post Anesthesia Home Care Instructions  Activity: Get plenty of rest for the remainder of the day. A responsible adult should stay with you for 24 hours following the procedure.  For the next 24 hours, DO NOT: -Drive a car -Paediatric nurse -Drink alcoholic beverages -Take any medication unless instructed by your physician -Make any legal decisions or sign important papers.  Meals: Start with liquid foods such as gelatin or soup. Progress to regular foods as tolerated. Avoid greasy, spicy, heavy foods. If nausea and/or vomiting occur, drink only clear liquids until the nausea and/or vomiting subsides. Call your physician if vomiting continues.  Special Instructions/Symptoms: Your throat may feel dry or sore from the anesthesia or the breathing tube placed in your throat during surgery. If this causes discomfort, gargle with warm salt water. The discomfort should disappear within 24 hours.

## 2014-03-05 NOTE — Anesthesia Postprocedure Evaluation (Signed)
  Anesthesia Post-op Note  Patient: Lee Smith  Procedure(s) Performed: Procedure(s): CYSTOSCOPY WITH URETEROSCOPY AND STENT EXCHANGE, STONE EXTRACTION (Right) HOLMIUM LASER APPLICATION (Right)  Patient Location: PACU  Anesthesia Type:General  Level of Consciousness: awake, alert  and oriented  Airway and Oxygen Therapy: Patient Spontanous Breathing  Post-op Pain: none  Post-op Assessment: Post-op Vital signs reviewed  Post-op Vital Signs: Reviewed  Last Vitals:  Filed Vitals:   03/05/14 0910  BP: 138/89  Pulse: 64  Temp:   Resp: 13    Complications: No apparent anesthesia complications

## 2014-03-05 NOTE — Interval H&P Note (Signed)
History and Physical Interval Note:  03/05/2014 7:31 AM  Lee Smith  has presented today for surgery, with the diagnosis of Right Ureteral Stone  The various methods of treatment have been discussed with the patient and family. After consideration of risks, benefits and other options for treatment, the patient has consented to  Procedure(s): CYSTOSCOPY WITH URETEROSCOPY AND STENT PLACEMENT (Right) HOLMIUM LASER APPLICATION (Right) as a surgical intervention .  The patient's history has been reviewed, patient examined, no change in status, stable for surgery.  I have reviewed the patient's chart and labs.  Questions were answered to the patient's satisfaction.  Pt tolerating stent well. No dysuria, fever. He feels well. Discussed possible new stent, failure to gain RG access, need for proximal access in future (PCNx) among other risks.    Festus Aloe

## 2014-03-05 NOTE — Op Note (Signed)
Preoperative diagnosis: Right ureteral stone, right renal stone Postoperative diagnosis: Same  Procedure: Cystoscopy Right ureteroscopy Holmium laser lithotripsy Stone basket extraction Right ureteral stent placement  Surgeon: Matty Vanroekel  Type of anesthesia: Gen.  Findings: Stone impacted in right proximal ureter with tortuosity of the ureter distal to the stone and significant edema and erythema around the stone.  Description of procedure: After consent was obtained patient brought to the operating room. After adequate anesthesia he is placed in lithotomy position and prepped and draped in the usual sterile fashion. Cystoscope was passed per urethra and the right ureteral stent was visualized. It was grasped and pulled through the urethral meatus without difficulty. A sensor wire was advanced through the stent and cold in the collecting system. Adjacent to the wire a rigid ureteroscope was advanced. A tortuosity of the proximal ureter was easily negotiated after leaving a stent and the stone was located. It was fragmented at a setting of low energy 0.2-0.4 and a high rate to prevent retrograde propulsion. The stone broke up it did progress into the proximal ureter reaching the limits of the rigid ureteroscope. Therefore under direct vision a second wire was advanced and coiled in the collecting system under fluoroscopic guidance. The first wire was visually confirmed in the collecting system.  The rigid ureteroscope was backed out. The cystoscope was passed in the bladder drained. Over the second wire an access sheath was advanced. The digital ureteroscope was advanced. The tip of the digital ureteroscope was like an accordion which made it difficult passed through the tortuous and edematous proximal ureter but this was accomplished without difficulty. The stone fragments were near the UPJ and there pushed into a midpole calyx reason collection. There was one larger fragment that needed further  lithotripsy and he was placed in the midpole calyx. The laser fiber was repassed and this fragment was broken up into a few small pieces. A 0 tip basket was then advanced and one of the fragments was removed without difficulty. Going back through the prior stone impaction site it was difficult to negotiate the scope and it became evident in the right posterior the mucosa began to shear off therefore the procedure was terminated. The ureteral access sheath was backloaded on the ureteroscope and the ureter carefully inspected and noted to be without any significant fragments or injury. The wire was backloaded on the cystoscope and a 6 x 28 cm stent was advanced. The wire was removed and a good coil reconstituted in the collecting system and in the bladder. The bladder was drained and the scope removed. Patient was awakened and taken to recovery in stable condition.  Complication: none  EBL: minimal  Drain: 6 x 28 cm right ureteral stent  Specimen: stone fragment to office lab

## 2014-03-05 NOTE — Anesthesia Procedure Notes (Signed)
Procedure Name: LMA Insertion Date/Time: 03/05/2014 7:37 AM Performed by: Denna Haggard D Pre-anesthesia Checklist: Patient identified, Emergency Drugs available, Suction available and Patient being monitored Patient Re-evaluated:Patient Re-evaluated prior to inductionOxygen Delivery Method: Circle System Utilized Preoxygenation: Pre-oxygenation with 100% oxygen Intubation Type: IV induction Ventilation: Mask ventilation without difficulty LMA: LMA inserted LMA Size: 4.0 Number of attempts: 1 Airway Equipment and Method: bite block Placement Confirmation: positive ETCO2 Tube secured with: Tape Dental Injury: Teeth and Oropharynx as per pre-operative assessment

## 2014-03-05 NOTE — Anesthesia Preprocedure Evaluation (Signed)
Anesthesia Evaluation  Patient identified by MRN, date of birth, ID band Patient awake    Reviewed: Allergy & Precautions, H&P , NPO status , Patient's Chart, lab work & pertinent test results  Airway Mallampati: II TM Distance: >3 FB Neck ROM: Full    Dental  (+) Teeth Intact, Dental Advisory Given   Pulmonary former smoker,  breath sounds clear to auscultation        Cardiovascular hypertension, + dysrhythmias (RBBB) Rhythm:Regular Rate:Normal     Neuro/Psych PSYCHIATRIC DISORDERS Depression negative neurological ROS     GI/Hepatic negative GI ROS, Neg liver ROS,   Endo/Other  negative endocrine ROS  Renal/GU negative Renal ROS     Musculoskeletal negative musculoskeletal ROS (+)   Abdominal   Peds  Hematology negative hematology ROS (+)   Anesthesia Other Findings   Reproductive/Obstetrics negative OB ROS                           Anesthesia Physical Anesthesia Plan  ASA: II  Anesthesia Plan: General   Post-op Pain Management:    Induction: Intravenous  Airway Management Planned: LMA  Additional Equipment: None  Intra-op Plan:   Post-operative Plan: Extubation in OR  Informed Consent: I have reviewed the patients History and Physical, chart, labs and discussed the procedure including the risks, benefits and alternatives for the proposed anesthesia with the patient or authorized representative who has indicated his/her understanding and acceptance.   Dental advisory given  Plan Discussed with: CRNA  Anesthesia Plan Comments:         Anesthesia Quick Evaluation

## 2014-03-06 ENCOUNTER — Other Ambulatory Visit: Payer: Self-pay | Admitting: Urology

## 2014-03-06 ENCOUNTER — Encounter (HOSPITAL_BASED_OUTPATIENT_CLINIC_OR_DEPARTMENT_OTHER): Payer: Self-pay | Admitting: Urology

## 2014-03-07 ENCOUNTER — Other Ambulatory Visit (HOSPITAL_COMMUNITY): Payer: Medicare Other

## 2014-03-18 ENCOUNTER — Encounter (HOSPITAL_COMMUNITY): Admission: RE | Payer: Self-pay | Source: Ambulatory Visit

## 2014-03-18 ENCOUNTER — Inpatient Hospital Stay (HOSPITAL_COMMUNITY): Admission: RE | Admit: 2014-03-18 | Payer: Medicare Other | Source: Ambulatory Visit | Admitting: Orthopedic Surgery

## 2014-03-18 SURGERY — ARTHROPLASTY, KNEE, UNICOMPARTMENTAL
Anesthesia: General | Laterality: Left

## 2014-03-19 ENCOUNTER — Encounter (HOSPITAL_BASED_OUTPATIENT_CLINIC_OR_DEPARTMENT_OTHER): Payer: Self-pay | Admitting: *Deleted

## 2014-03-19 NOTE — Progress Notes (Signed)
NPO AFTER MN. ARRIVE AT 0600. NEEDS EKG. CURRENT ISTAT 8 RESULTS IN CHART AND EPIC. WILL TAKE COZAAR AM DOS W/ SIPS OF WATER.

## 2014-03-20 DIAGNOSIS — N201 Calculus of ureter: Secondary | ICD-10-CM | POA: Diagnosis not present

## 2014-03-26 ENCOUNTER — Encounter (HOSPITAL_BASED_OUTPATIENT_CLINIC_OR_DEPARTMENT_OTHER): Payer: Self-pay

## 2014-03-26 ENCOUNTER — Encounter (HOSPITAL_BASED_OUTPATIENT_CLINIC_OR_DEPARTMENT_OTHER): Payer: Medicare Other | Admitting: Anesthesiology

## 2014-03-26 ENCOUNTER — Ambulatory Visit (HOSPITAL_BASED_OUTPATIENT_CLINIC_OR_DEPARTMENT_OTHER): Payer: Medicare Other | Admitting: Anesthesiology

## 2014-03-26 ENCOUNTER — Ambulatory Visit (HOSPITAL_BASED_OUTPATIENT_CLINIC_OR_DEPARTMENT_OTHER)
Admission: RE | Admit: 2014-03-26 | Discharge: 2014-03-26 | Disposition: A | Discharge status: Home / Self Care | Payer: Medicare Other | Source: Ambulatory Visit | Attending: Urology | Admitting: Urology

## 2014-03-26 ENCOUNTER — Encounter (HOSPITAL_BASED_OUTPATIENT_CLINIC_OR_DEPARTMENT_OTHER)
Admission: RE | Disposition: A | Discharge status: Home / Self Care | Payer: Self-pay | Source: Ambulatory Visit | Attending: Urology

## 2014-03-26 DIAGNOSIS — J309 Allergic rhinitis, unspecified: Secondary | ICD-10-CM | POA: Insufficient documentation

## 2014-03-26 DIAGNOSIS — E785 Hyperlipidemia, unspecified: Secondary | ICD-10-CM | POA: Diagnosis not present

## 2014-03-26 DIAGNOSIS — M1710 Unilateral primary osteoarthritis, unspecified knee: Secondary | ICD-10-CM | POA: Diagnosis not present

## 2014-03-26 DIAGNOSIS — N201 Calculus of ureter: Secondary | ICD-10-CM | POA: Diagnosis not present

## 2014-03-26 DIAGNOSIS — I10 Essential (primary) hypertension: Secondary | ICD-10-CM | POA: Insufficient documentation

## 2014-03-26 DIAGNOSIS — IMO0002 Reserved for concepts with insufficient information to code with codable children: Secondary | ICD-10-CM

## 2014-03-26 DIAGNOSIS — N200 Calculus of kidney: Secondary | ICD-10-CM | POA: Diagnosis not present

## 2014-03-26 DIAGNOSIS — Z860100 Personal history of colon polyps, unspecified: Secondary | ICD-10-CM | POA: Diagnosis not present

## 2014-03-26 HISTORY — DX: Calculus of kidney: N20.0

## 2014-03-26 HISTORY — PX: STONE EXTRACTION WITH BASKET: SHX5318

## 2014-03-26 HISTORY — PX: CYSTOSCOPY WITH URETEROSCOPY AND STENT PLACEMENT: SHX6377

## 2014-03-26 SURGERY — CYSTOURETEROSCOPY, WITH STENT INSERTION
Anesthesia: General | Site: Ureter | Laterality: Right

## 2014-03-26 MED ORDER — MIDAZOLAM HCL 2 MG/2ML IJ SOLN
INTRAMUSCULAR | Status: AC
  Filled 2014-03-26: qty 2

## 2014-03-26 MED ORDER — PROPOFOL INFUSION 10 MG/ML OPTIME
INTRAVENOUS | Status: DC | PRN
  Administered 2014-03-26: 200 mL via INTRAVENOUS

## 2014-03-26 MED ORDER — KETOROLAC TROMETHAMINE 30 MG/ML IJ SOLN
INTRAMUSCULAR | Status: DC | PRN
  Administered 2014-03-26: 30 mg via INTRAVENOUS

## 2014-03-26 MED ORDER — MIDAZOLAM HCL 5 MG/5ML IJ SOLN
INTRAMUSCULAR | Status: DC | PRN
  Administered 2014-03-26: 2 mg via INTRAVENOUS

## 2014-03-26 MED ORDER — CEFAZOLIN SODIUM-DEXTROSE 2-3 GM-% IV SOLR
INTRAVENOUS | Status: AC
  Filled 2014-03-26: qty 50

## 2014-03-26 MED ORDER — STERILE WATER FOR IRRIGATION IR SOLN
Status: DC | PRN
  Administered 2014-03-26: 500 mL via INTRAVESICAL

## 2014-03-26 MED ORDER — CEFAZOLIN SODIUM 1-5 GM-% IV SOLN
1.0000 g | INTRAVENOUS | Status: DC
  Filled 2014-03-26: qty 50

## 2014-03-26 MED ORDER — IOHEXOL 350 MG/ML SOLN
INTRAVENOUS | Status: DC | PRN
  Administered 2014-03-26: 10 mL

## 2014-03-26 MED ORDER — ONDANSETRON HCL 4 MG/2ML IJ SOLN
INTRAMUSCULAR | Status: DC | PRN
  Administered 2014-03-26: 4 mg via INTRAVENOUS

## 2014-03-26 MED ORDER — LIDOCAINE HCL 2 % EX GEL
CUTANEOUS | Status: DC | PRN
  Administered 2014-03-26: 1 via URETHRAL

## 2014-03-26 MED ORDER — DEXAMETHASONE SODIUM PHOSPHATE 10 MG/ML IJ SOLN
INTRAMUSCULAR | Status: DC | PRN
  Administered 2014-03-26: 10 mg via INTRAVENOUS

## 2014-03-26 MED ORDER — LIDOCAINE HCL (CARDIAC) 20 MG/ML IV SOLN
INTRAVENOUS | Status: DC | PRN
  Administered 2014-03-26: 80 mg via INTRAVENOUS

## 2014-03-26 MED ORDER — SODIUM CHLORIDE 0.9 % IR SOLN
Status: DC | PRN
  Administered 2014-03-26: 4000 mL

## 2014-03-26 MED ORDER — FENTANYL CITRATE 0.05 MG/ML IJ SOLN
INTRAMUSCULAR | Status: DC | PRN
  Administered 2014-03-26 (×2): 50 ug via INTRAVENOUS

## 2014-03-26 MED ORDER — FENTANYL CITRATE 0.05 MG/ML IJ SOLN
25.0000 ug | INTRAMUSCULAR | Status: DC | PRN
  Filled 2014-03-26: qty 1

## 2014-03-26 MED ORDER — KETOROLAC TROMETHAMINE 30 MG/ML IJ SOLN
15.0000 mg | Freq: Once | INTRAMUSCULAR | Status: DC | PRN
  Filled 2014-03-26: qty 1

## 2014-03-26 MED ORDER — ACETAMINOPHEN 10 MG/ML IV SOLN
INTRAVENOUS | Status: DC | PRN
  Administered 2014-03-26: 1000 mg via INTRAVENOUS

## 2014-03-26 MED ORDER — PROMETHAZINE HCL 25 MG/ML IJ SOLN
6.2500 mg | INTRAMUSCULAR | Status: DC | PRN
  Filled 2014-03-26: qty 1

## 2014-03-26 MED ORDER — CEFAZOLIN SODIUM-DEXTROSE 2-3 GM-% IV SOLR
2.0000 g | INTRAVENOUS | Status: AC
  Administered 2014-03-26: 2 g via INTRAVENOUS
  Filled 2014-03-26: qty 50

## 2014-03-26 MED ORDER — FENTANYL CITRATE 0.05 MG/ML IJ SOLN
INTRAMUSCULAR | Status: AC
  Filled 2014-03-26: qty 4

## 2014-03-26 MED ORDER — LACTATED RINGERS IV SOLN
INTRAVENOUS | Status: DC
  Administered 2014-03-26: 07:00:00 via INTRAVENOUS
  Filled 2014-03-26: qty 1000

## 2014-03-26 SURGICAL SUPPLY — 43 items
ADAPTER CATH URET PLST 4-6FR (CATHETERS) IMPLANT
BAG DRAIN URO-CYSTO SKYTR STRL (DRAIN) ×4 IMPLANT
BASKET LASER NITINOL 1.9FR (BASKET) IMPLANT
BASKET STNLS GEMINI 4WIRE 3FR (BASKET) IMPLANT
BASKET STONE 1.7 NGAGE (UROLOGICAL SUPPLIES) ×4 IMPLANT
BASKET ZERO TIP NITINOL 2.4FR (BASKET) IMPLANT
CANISTER SUCT LVC 12 LTR MEDI- (MISCELLANEOUS) ×4 IMPLANT
CATH INTERMIT  6FR 70CM (CATHETERS) IMPLANT
CATH URET 5FR 28IN CONE TIP (BALLOONS)
CATH URET 5FR 28IN OPEN ENDED (CATHETERS) ×4 IMPLANT
CATH URET 5FR 70CM CONE TIP (BALLOONS) IMPLANT
CATH URET DUAL LUMEN 6-10FR 50 (CATHETERS) ×4 IMPLANT
CLOTH BEACON ORANGE TIMEOUT ST (SAFETY) ×4 IMPLANT
DRAPE CAMERA CLOSED 9X96 (DRAPES) ×4 IMPLANT
ELECT REM PT RETURN 9FT ADLT (ELECTROSURGICAL)
ELECTRODE REM PT RTRN 9FT ADLT (ELECTROSURGICAL) IMPLANT
GLOVE BIO SURGEON STRL SZ 6.5 (GLOVE) ×3 IMPLANT
GLOVE BIO SURGEON STRL SZ7.5 (GLOVE) ×4 IMPLANT
GLOVE BIO SURGEONS STRL SZ 6.5 (GLOVE) ×1
GLOVE INDICATOR 6.5 STRL GRN (GLOVE) ×8 IMPLANT
GOWN PREVENTION PLUS LG XLONG (DISPOSABLE) IMPLANT
GOWN STRL REIN XL XLG (GOWN DISPOSABLE) IMPLANT
GOWN STRL REUS W/ TWL LRG LVL3 (GOWN DISPOSABLE) ×2 IMPLANT
GOWN STRL REUS W/ TWL XL LVL3 (GOWN DISPOSABLE) ×2 IMPLANT
GOWN STRL REUS W/TWL LRG LVL3 (GOWN DISPOSABLE) ×2
GOWN STRL REUS W/TWL XL LVL3 (GOWN DISPOSABLE) ×2
GUIDEWIRE 0.038 PTFE COATED (WIRE) IMPLANT
GUIDEWIRE ANG ZIPWIRE 038X150 (WIRE) ×4 IMPLANT
GUIDEWIRE STR DUAL SENSOR (WIRE) ×4 IMPLANT
IV NS 1000ML (IV SOLUTION) ×2
IV NS 1000ML BAXH (IV SOLUTION) ×2 IMPLANT
IV NS IRRIG 3000ML ARTHROMATIC (IV SOLUTION) ×4 IMPLANT
KIT BALLIN UROMAX 15FX10 (LABEL) IMPLANT
KIT BALLN UROMAX 15FX4 (MISCELLANEOUS) IMPLANT
KIT BALLN UROMAX 26 75X4 (MISCELLANEOUS)
PACK CYSTOSCOPY (CUSTOM PROCEDURE TRAY) ×4 IMPLANT
SET HIGH PRES BAL DIL (LABEL)
SHEATH ACCESS URETERAL 38CM (SHEATH) IMPLANT
SHEATH ACCESS URETERAL 54CM (SHEATH) ×4 IMPLANT
SHEATH URET ACCESS 12FR/35CM (UROLOGICAL SUPPLIES) IMPLANT
SHEATH URET ACCESS 12FR/55CM (UROLOGICAL SUPPLIES) IMPLANT
STENT URET 6FRX28 CONTOUR (STENTS) ×4 IMPLANT
SYRINGE IRR TOOMEY STRL 70CC (SYRINGE) IMPLANT

## 2014-03-26 NOTE — Anesthesia Postprocedure Evaluation (Signed)
  Anesthesia Post-op Note  Patient: Lee Smith  Procedure(s) Performed: Procedure(s) (LRB): CYSTO WITH RIGHT URETEROSCOPY/STENT PLACEMENT (Right) HOLMIUM LASER LITHOTRIPSY (Right)  Patient Location: PACU  Anesthesia Type: General  Level of Consciousness: awake and alert   Airway and Oxygen Therapy: Patient Spontanous Breathing  Post-op Pain: mild  Post-op Assessment: Post-op Vital signs reviewed, Patient's Cardiovascular Status Stable, Respiratory Function Stable, Patent Airway and No signs of Nausea or vomiting  Last Vitals:  Filed Vitals:   03/26/14 0952  BP: 170/96  Pulse: 60  Temp: 36.1 C  Resp: 14    Post-op Vital Signs: stable   Complications: No apparent anesthesia complications

## 2014-03-26 NOTE — Op Note (Signed)
Preoperative diagnosis: Right renal stones Postoperative diagnosis: Right renal stones  Procedure: Cystoscopy,  right retrograde pyelogram,  right ureteroscopy with stone basket extraction,  right ureteral stent placement  Surgeon: Junious Silk  Anesthesia: Gen.  Findings: Right retrograde pyelogram-this outlined a single ureter single collecting system unit without significant filling defect, stricture or dilation.  On ureteroscopy, multiple significant stone fragments in the right kidney. Ureter normal.  Description of procedure: After consent was obtained patient brought to the operating room. After adequate anesthesia he is placed in lithotomy position and prepped and draped in the usual sterile fashion. A timeout was performed to confirm the patient and procedure. The cystoscope was passed per urethra and the right ureteral stent grasped and removed through the urethral meatus. A sensor wire was advanced and coiled in the collecting system. A dual-lumen exchange catheter was advanced into the proximal ureter without difficulty. Through the side port retrograde injection of contrast was performed. A dual-lumen was pulled back to the junction of the proximal and mid ureter at the pelvic brim outlining a normal proximal ureter and collecting system. A Glidewire was then advanced through the side port and also coiled in the collecting system. A dual-lumen was removed. Over the sensor wire a long access sheath was passed into the proximal ureter without any difficulty. The sheath and it just at the UPJ. The digital ureteroscope was advanced were multiple fragments were noted in the kidney and these were collected within engage basket without difficulty. A 7 fragments as well as the small stone that was in the kidney were removed. Total of 8 significant pieces of stone. I carefully inspected the collecting system and noted no other fragments or stones. The anatomy was favorable for ureteroscopic  inspection and I was reasonably assured there were any remaining pieces. No obvious pieces were seen on fluoroscopy imaging. The access sheath was backed out on the ureteroscope and the ureter carefully inspected. The proximal ureter was normal, and about the mid proximal ureter there was some edema of the mucosa and this is where it was difficult to negotiate during the prior procedures. I suspect this may been stone was impacted. Nonetheless the ureter was completely intact and normal caliber. There is no sign of any trauma. The entire ureter was inspected again and noted that no fragments or injury. The Glidewire was backloaded on the cystoscope and a 6 x 28 cm stent was advanced. The wire was removed with a good coil seen in the collecting system and a good coil in the bladder. The bladder was drained. The scope was removed and lidocaine jelly was instilled per urethra. The patient was awakened and taken to the recovery room in stable condition.  Complications: None  Blood loss: Minimal  Specimens: Stone/stone fragments given to patient  Drains: 6 x 28 cm right ureteral stent with string

## 2014-03-26 NOTE — H&P (Signed)
H&P   History of Present Illness: Pt here for hopefully final stage of right staged ureteroscopy. He has been well. No fever, chills, coughs, congestion, etc. He's having some typical stent pain (RLQ discomfort, urgency, etc.) but symptoms are mild and have improved.    Past Medical History  Diagnosis Date  . Hypertension   . Hyperlipidemia   . Genital warts   . Hx of colonic polyps     2008  . History of kidney stones 11/2013    lithotripsy for right ureteral stone  . Allergic rhinitis   . Arthritis     bilateral knees; has planned partial knee replacement next month  . Right nephrolithiasis    Past Surgical History  Procedure Laterality Date  . Cholecystectomy  2002  . Extracorporeal shock wave lithotripsy Right 12-17-2013  . Umbilical hernia repair  2000  . Inguinal hernia repair Bilateral right 2003/   left 2004  . Cystoscopy with retrograde pyelogram, ureteroscopy and stent placement Right 02/12/2014    Procedure: RIGHT URETEROSCOPY ;  Surgeon: Festus Aloe, MD;  Location: [facility];  Service: Urology;  Laterality: Right;  . Cystoscopy w/ ureteral stent placement Right 02/12/2014    Procedure: CYSTOSCOPY WITH RETROGRADE PYELOGRAM/URETERAL STENT PLACEMENT;  Surgeon: Festus Aloe, MD;  Location: [facility];  Service: Urology;  Laterality: Right;  . Cystoscopy with ureteroscopy and stent placement Right 03/05/2014    Procedure: CYSTOSCOPY WITH URETEROSCOPY AND STENT EXCHANGE, STONE EXTRACTION;  Surgeon: Festus Aloe, MD;  Location: [facility];  Service: Urology;  Laterality: Right;  . Holmium laser application Right 02/25/1750    Procedure: HOLMIUM LASER APPLICATION;  Surgeon: Festus Aloe, MD;  Location: [facility];  Service: Urology;  Laterality: Right;    Home Medications:  Prescriptions prior to admission  Medication Sig Dispense Refill  . acetaminophen (TYLENOL) 500 MG tablet Take 500 mg by  mouth every 6 (six) hours as needed.      Marland Kitchen losartan (COZAAR) 100 MG tablet Take 1 tablet (100 mg total) by mouth daily.  30 tablet  5  . meloxicam (MOBIC) 15 MG tablet Take 15 mg by mouth daily.      . polyvinyl alcohol (LIQUIFILM TEARS) 1.4 % ophthalmic solution Place 1 drop into both eyes as needed for dry eyes.      . tadalafil (CIALIS) 20 MG tablet Take 20 mg by mouth daily as needed for erectile dysfunction.      . tamsulosin (FLOMAX) 0.4 MG CAPS capsule Take 1 capsule (0.4 mg total) by mouth daily.  10 capsule  0   Allergies: No Known Allergies  Family History  Problem Relation Age of Onset  . Cancer Father     prostate  . Cancer Sister     breast  . Arthritis      family hx  . Hyperlipidemia      family hx  . Hypertension      family hx   Social History:  reports that he quit smoking about 43 years ago. His smoking use included Cigarettes. He has a 6 pack-year smoking history. He has quit using smokeless tobacco. His smokeless tobacco use included Chew. He reports that he drinks about 4.2 ounces of alcohol per week. He reports that he does not use illicit drugs.  ROS: A complete review of systems was performed.  All systems are negative except for pertinent findings as noted. ROS   Physical Exam:  Vital signs in last 24 hours:  Temp:  [98.1 F (36.7 C)] 98.1 F (36.7 C) (09/08 8891) Pulse Rate:  [61] 61 (09/08 0608) Resp:  [16] 16 (09/08 0608) BP: (154)/(94) 154/94 mmHg (09/08 0608) SpO2:  [98 %] 98 % (09/08 0608) Weight:  [103.193 kg (227 lb 8 oz)] 103.193 kg (227 lb 8 oz) (09/08 6945) General:  Alert and oriented, No acute distress HEENT: Normocephalic, atraumatic Neck: No JVD or lymphadenopathy Cardiovascular: Regular rate and rhythm Lungs: Regular rate and effort Abdomen: Soft, nontender, nondistended, no abdominal masses Back: No CVA tenderness Extremities: No edema Neurologic: Grossly intact  Laboratory Data:  No results found for this or any previous  visit (from the past 24 hour(s)). No results found for this or any previous visit (from the past 240 hour(s)). Creatinine: No results found for this basename: CREATININE,  in the last 168 hours  Impression/Assessment/plan: I discussed with the patient the nature, potential benefits, risks and alternatives to cystoscopy, right ureteroscopy, including side effects of the proposed treatment, the likelihood of the patient achieving the goals of the procedure, and any potential problems that might occur during the procedure or recuperation. We hope to inspect collecting system and remove any potentially significant stone fragments. We discussed alternatives such as f/u in office with simple stent removal. All questions answered. Patient elects to proceed with ureteroscopy. We discussed likely new post-op stent for a temporary time as passing the access sheath and ureteroscope can induce some ureteral edema.    Festus Aloe 03/26/2014, 7:25 AM

## 2014-03-26 NOTE — Discharge Instructions (Signed)
Ureteral Stent Implantation, Care After Refer to this sheet in the next few weeks. These instructions provide you with information on caring for yourself after your procedure. Your health care provider may also give you more specific instructions. Your treatment has been planned according to current medical practices, but problems sometimes occur. Call your health care provider if you have any problems or questions after your procedure. WHAT TO EXPECT AFTER THE PROCEDURE You should be back to normal activity within 48 hours after the procedure. Nausea and vomiting may occur and are commonly the result of anesthesia. It is common to experience sharp pain in the back or lower abdomen and penis with voiding. This is caused by movement of the ends of the stent with the act of urinating.It usually goes away within minutes after you have stopped urinating. HOME CARE INSTRUCTIONS Make sure to drink plenty of fluids. You may have small amounts of bleeding, causing your urine to be red. This is normal. Certain movements may trigger pain or a feeling that you need to urinate. You may be given medicines to prevent infection or bladder spasms. Be sure to take all medicines as directed. Only take over-the-counter or prescription medicines for pain, discomfort, or fever as directed by your health care provider. Do not take aspirin, as this can make bleeding worse.  REMOVAL OF THE STENT -  Friday morning, Sept 11, 2015 you may remove the stent by pulling the string with slow steady pressure until the entire stent is removed.  SEEK MEDICAL CARE IF:  You experience increasing pain.  Your pain medicine is not working. SEEK IMMEDIATE MEDICAL CARE IF:  Your urine is dark red or has blood clots.  You are leaking urine (incontinent).  You have a fever, chills, feeling sick to your stomach (nausea), or vomiting.  Your pain is not relieved by pain medicine.  The end of the stent comes out of the urethra.  You  are unable to urinate.     Post Anesthesia Home Care Instructions  Activity: Get plenty of rest for the remainder of the day. A responsible adult should stay with you for 24 hours following the procedure.  For the next 24 hours, DO NOT: -Drive a car -Paediatric nurse -Drink alcoholic beverages -Take any medication unless instructed by your physician -Make any legal decisions or sign important papers.  Meals: Start with liquid foods such as gelatin or soup. Progress to regular foods as tolerated. Avoid greasy, spicy, heavy foods. If nausea and/or vomiting occur, drink only clear liquids until the nausea and/or vomiting subsides. Call your physician if vomiting continues.  Special Instructions/Symptoms: Your throat may feel dry or sore from the anesthesia or the breathing tube placed in your throat during surgery. If this causes discomfort, gargle with warm salt water. The discomfort should disappear within 24 hours.

## 2014-03-26 NOTE — Anesthesia Procedure Notes (Signed)
Procedure Name: LMA Insertion Date/Time: 03/26/2014 7:33 AM Performed by: Wanita Chamberlain Pre-anesthesia Checklist: Patient identified, Timeout performed, Emergency Drugs available, Suction available and Patient being monitored Patient Re-evaluated:Patient Re-evaluated prior to inductionOxygen Delivery Method: Circle system utilized Preoxygenation: Pre-oxygenation with 100% oxygen Intubation Type: IV induction Ventilation: Mask ventilation without difficulty LMA: LMA inserted LMA Size: 4.0 Number of attempts: 1 Airway Equipment and Method: Bite block Placement Confirmation: breath sounds checked- equal and bilateral and positive ETCO2 Tube secured with: Tape Dental Injury: Teeth and Oropharynx as per pre-operative assessment

## 2014-03-26 NOTE — Anesthesia Preprocedure Evaluation (Addendum)
Anesthesia Evaluation  Patient identified by MRN, date of birth, ID band Patient awake    Reviewed: Allergy & Precautions, H&P , NPO status , Patient's Chart, lab work & pertinent test results  Airway Mallampati: II TM Distance: >3 FB Neck ROM: Full    Dental no notable dental hx. (+) Implants, Caps, Teeth Intact, Dental Advisory Given,    Pulmonary neg pulmonary ROS, former smoker,  breath sounds clear to auscultation  Pulmonary exam normal       Cardiovascular Exercise Tolerance: Good hypertension, Pt. on medications Rhythm:Regular Rate:Normal  26-Mar-2014  Sinus bradycardia Right bundle branch block   Neuro/Psych Depression negative neurological ROS  negative psych ROS   GI/Hepatic negative GI ROS, Neg liver ROS,   Endo/Other  negative endocrine ROS  Renal/GU Renal calculus  negative genitourinary   Musculoskeletal negative musculoskeletal ROS (+) Arthritis -, Osteoarthritis,    Abdominal   Peds negative pediatric ROS (+)  Hematology negative hematology ROS (+)   Anesthesia Other Findings   Reproductive/Obstetrics negative OB ROS                      Anesthesia Physical Anesthesia Plan  ASA: II  Anesthesia Plan: General   Post-op Pain Management:    Induction: Intravenous  Airway Management Planned: LMA  Additional Equipment:   Intra-op Plan:   Post-operative Plan: Extubation in OR  Informed Consent: I have reviewed the patients History and Physical, chart, labs and discussed the procedure including the risks, benefits and alternatives for the proposed anesthesia with the patient or authorized representative who has indicated his/her understanding and acceptance.   Dental advisory given  Plan Discussed with: CRNA and Surgeon  Anesthesia Plan Comments:        Anesthesia Quick Evaluation

## 2014-03-26 NOTE — Transfer of Care (Signed)
Immediate Anesthesia Transfer of Care Note  Patient: Lee Smith  Procedure(s) Performed: Procedure(s): CYSTO WITH RIGHT URETEROSCOPY/STENT PLACEMENT (Right) HOLMIUM LASER LITHOTRIPSY (Right)  Patient Location: PACU  Anesthesia Type:General  Level of Consciousness: awake, alert , oriented and patient cooperative  Airway & Oxygen Therapy: Patient Spontanous Breathing and Patient connected to nasal cannula oxygen  Post-op Assessment: Report given to PACU RN and Post -op Vital signs reviewed and stable  Post vital signs: Reviewed and stable  Complications: No apparent anesthesia complications

## 2014-04-01 ENCOUNTER — Encounter (HOSPITAL_BASED_OUTPATIENT_CLINIC_OR_DEPARTMENT_OTHER): Payer: Self-pay | Admitting: Urology

## 2014-05-28 ENCOUNTER — Ambulatory Visit (INDEPENDENT_AMBULATORY_CARE_PROVIDER_SITE_OTHER): Payer: Medicare Other | Admitting: Family Medicine

## 2014-05-28 ENCOUNTER — Encounter: Payer: Self-pay | Admitting: Family Medicine

## 2014-05-28 ENCOUNTER — Ambulatory Visit (INDEPENDENT_AMBULATORY_CARE_PROVIDER_SITE_OTHER): Payer: Medicare Other

## 2014-05-28 VITALS — BP 140/92 | HR 87 | Temp 98.3°F | Ht 72.0 in | Wt 230.0 lb

## 2014-05-28 DIAGNOSIS — E785 Hyperlipidemia, unspecified: Secondary | ICD-10-CM | POA: Diagnosis not present

## 2014-05-28 DIAGNOSIS — Z23 Encounter for immunization: Secondary | ICD-10-CM

## 2014-05-28 DIAGNOSIS — Z0000 Encounter for general adult medical examination without abnormal findings: Secondary | ICD-10-CM

## 2014-05-28 DIAGNOSIS — N320 Bladder-neck obstruction: Secondary | ICD-10-CM | POA: Diagnosis not present

## 2014-05-28 DIAGNOSIS — I10 Essential (primary) hypertension: Secondary | ICD-10-CM

## 2014-05-28 LAB — HEPATIC FUNCTION PANEL
ALT: 18 U/L (ref 0–53)
AST: 23 U/L (ref 0–37)
Albumin: 3.6 g/dL (ref 3.5–5.2)
Alkaline Phosphatase: 65 U/L (ref 39–117)
BILIRUBIN DIRECT: 0.1 mg/dL (ref 0.0–0.3)
Total Bilirubin: 0.7 mg/dL (ref 0.2–1.2)
Total Protein: 6.5 g/dL (ref 6.0–8.3)

## 2014-05-28 LAB — BASIC METABOLIC PANEL
BUN: 18 mg/dL (ref 6–23)
CALCIUM: 9.1 mg/dL (ref 8.4–10.5)
CO2: 26 mEq/L (ref 19–32)
CREATININE: 1 mg/dL (ref 0.4–1.5)
Chloride: 106 mEq/L (ref 96–112)
GFR: 80.53 mL/min (ref >60.00)
Glucose, Bld: 98 mg/dL (ref 70–99)
Potassium: 4.5 mEq/L (ref 3.5–5.1)
Sodium: 140 mEq/L (ref 135–145)

## 2014-05-28 LAB — LIPID PANEL
CHOL/HDL RATIO: 6
CHOLESTEROL: 226 mg/dL — AB (ref 0–200)
HDL: 40.4 mg/dL (ref >39.00)
LDL Cholesterol: 161 mg/dL — ABNORMAL HIGH (ref 0–99)
NonHDL: 185.6
TRIGLYCERIDES: 124 mg/dL (ref 0.0–149.0)
VLDL: 24.8 mg/dL (ref 0.0–40.0)

## 2014-05-28 LAB — PSA: PSA: 3 ng/mL (ref 0.10–4.00)

## 2014-05-28 NOTE — Progress Notes (Signed)
Subjective:    Patient ID: Lee Smith, male    DOB: [birth year], 68 y.o.   MRN: [id number]  HPI   Patient is here for Medicare wellness exam and medical follow-up. He has history of hypertension, hyperlipidemia, obesity. He had fairly large kidney stone which required extraction earlier this year. Denies any current flank pain. He takes losartan for hypertension. Blood pressures at home usually around 569-794 systolic and 80-16 diastolic. He's been less active with exercise because of some knee pains recently. He needs flu vaccine and Prevnar 13. He does not have coverage for shingles vaccine and declines.  He does have some nocturia and occasional slow stream. He usually gets up 2 or 3 times at night to urinate and this has been fairly consistent for him and unchanged. He does have some erectile dysfunction takes Cialis as needed.he has taken Flomax in the past but not currently  Past Medical History  Diagnosis Date  . Hypertension   . Hyperlipidemia   . Genital warts   . Hx of colonic polyps     2008  . History of kidney stones 11/2013    lithotripsy for right ureteral stone  . Allergic rhinitis   . Arthritis     bilateral knees; has planned partial knee replacement next month  . Right nephrolithiasis    Past Surgical History  Procedure Laterality Date  . Cholecystectomy  2002  . Extracorporeal shock wave lithotripsy Right 12-17-2013  . Umbilical hernia repair  2000  . Inguinal hernia repair Bilateral right 2003/   left 2004  . Cystoscopy with retrograde pyelogram, ureteroscopy and stent placement Right 02/12/2014    Procedure: RIGHT URETEROSCOPY ;  Surgeon: Festus Aloe, MD;  Location: [facility];  Service: Urology;  Laterality: Right;  . Cystoscopy w/ ureteral stent placement Right 02/12/2014    Procedure: CYSTOSCOPY WITH RETROGRADE PYELOGRAM/URETERAL STENT PLACEMENT;  Surgeon: Festus Aloe, MD;  Location: [facility];  Service: Urology;   Laterality: Right;  . Cystoscopy with ureteroscopy and stent placement Right 03/05/2014    Procedure: CYSTOSCOPY WITH URETEROSCOPY AND STENT EXCHANGE, STONE EXTRACTION;  Surgeon: Festus Aloe, MD;  Location: [facility];  Service: Urology;  Laterality: Right;  . Holmium laser application Right 5/53/7482    Procedure: HOLMIUM LASER APPLICATION;  Surgeon: Festus Aloe, MD;  Location: [facility];  Service: Urology;  Laterality: Right;  . Cystoscopy with ureteroscopy and stent placement Right 03/26/2014    Procedure: CYSTO WITH RIGHT URETEROSCOPY/STENT PLACEMENT;  Surgeon: Festus Aloe, MD;  Location: [facility];  Service: Urology;  Laterality: Right;  . Stone extraction with basket Right 03/26/2014    Procedure: STONE EXTRACTION WITH BASKET;  Surgeon: Festus Aloe, MD;  Location: [facility];  Service: Urology;  Laterality: Right;    reports that he quit smoking about 43 years ago. His smoking use included Cigarettes. He has a 6 pack-year smoking history. He has quit using smokeless tobacco. His smokeless tobacco use included Chew. He reports that he drinks about 4.2 oz of alcohol per week. He reports that he does not use illicit drugs. family history includes Arthritis in an other family member; Cancer in his father and sister; Hyperlipidemia in an other family member; Hypertension in an other family member. No Known Allergies  1.  Risk factors based on Past Medical , Social, and Family history reviewed and as indicated above with no changes 2.  Limitations in physical activities None.  No recent  falls. 3.  Depression/mood No active depression or anxiety issues 4.  Hearing No defiits 5.  ADLs independent in all. 6.  Cognitive function (orientation to time and place, language, writing, speech,memory) no short or long term memory issues.  Language and judgement intact. 7.  Home Safety no issues 8.  Height, weight, and visual  acuity.all stable. 9.  Counseling discussed weight loss strategies 10. Recommendation of preventive services.flu vaccine and Prevnar 13. Consider shingles vaccine later 11. Labs based on risk factors PSA, basic metabolic panel, lipid panel, hepatic panel 12. Care Plan as above 13. Other Providers-Dr. Junious Silk urology 14. Written schedule of screening/prevention services given to patient.    Review of Systems  Constitutional: Negative for fever, activity change, appetite change and fatigue.  HENT: Negative for congestion, ear pain and trouble swallowing.   Eyes: Negative for pain and visual disturbance.  Respiratory: Negative for cough, shortness of breath and wheezing.   Cardiovascular: Negative for chest pain and palpitations.  Gastrointestinal: Negative for nausea, vomiting, abdominal pain, diarrhea, constipation, blood in stool, abdominal distention and rectal pain.  Genitourinary: Positive for urgency and frequency. Negative for dysuria, hematuria and testicular pain.  Musculoskeletal: Positive for arthralgias. Negative for joint swelling.  Skin: Negative for rash.  Neurological: Negative for dizziness, syncope and headaches.  Hematological: Negative for adenopathy.  Psychiatric/Behavioral: Negative for confusion and dysphoric mood.       Objective:   Physical Exam  Constitutional: He is oriented to person, place, and time. He appears well-developed and well-nourished. No distress.  HENT:  Head: Normocephalic and atraumatic.  Right Ear: External ear normal.  Left Ear: External ear normal.  Mouth/Throat: Oropharynx is clear and moist.  Eyes: Conjunctivae and EOM are normal. Pupils are equal, round, and reactive to light.  Neck: Normal range of motion. Neck supple. No thyromegaly present.  Cardiovascular: Normal rate, regular rhythm and normal heart sounds.   No murmur heard. Pulmonary/Chest: No respiratory distress. He has no wheezes. He has no rales.  Abdominal: Soft. Bowel  sounds are normal. He exhibits no distension and no mass. There is no tenderness. There is no rebound and no guarding.  Genitourinary:  Prostate minimally enlarged. He does have one palpated area of nodularity right lateral prostate lobe. No rectal masses otherwise.  Musculoskeletal: He exhibits no edema.  Lymphadenopathy:    He has no cervical adenopathy.  Neurological: He is alert and oriented to person, place, and time. He displays normal reflexes. No cranial nerve deficit.  Skin: No rash noted.  Psychiatric: He has a normal mood and affect.          Assessment & Plan:  #1 health maintenance. Flu vaccine given. Prevnar 13 given. Encouraged to consider shingles vaccine but he is not sure he has coverage. Colonoscopy up-to-date. Tetanus up-to-date We discussed PSA after discussion of pros and cons. #2 hypertension. Marginal control. Discussed option of additional medication versus more diligent exercise and weight loss and he prefers the latter. Be in touch if consistently above 150/90. #3 history of hyperlipidemia currently not treated. Recheck lipid panel #4 BPH. He is developing some symptoms of nocturia. He is not interested in further medication such as Flomax this time though he has been on this in the past

## 2014-05-28 NOTE — Patient Instructions (Signed)
Continue yearly flu vaccine. Tetanus every 10 years. We will call you regarding lab work obtained today Try to lose some weight and establish more consistent aerobic exercise. B in touch if blood pressure consistently greater than 150/90 Consider shingles vaccine at some point later this year

## 2014-05-28 NOTE — Progress Notes (Signed)
Pre visit review using our clinic review tool, if applicable. No additional management support is needed unless otherwise documented below in the visit note. 

## 2014-05-29 ENCOUNTER — Other Ambulatory Visit: Payer: Self-pay | Admitting: Family Medicine

## 2014-05-29 ENCOUNTER — Telehealth: Payer: Self-pay | Admitting: Family Medicine

## 2014-05-29 DIAGNOSIS — R972 Elevated prostate specific antigen [PSA]: Secondary | ICD-10-CM

## 2014-05-29 NOTE — Telephone Encounter (Signed)
emmi emailed °

## 2014-07-30 ENCOUNTER — Other Ambulatory Visit: Payer: Self-pay

## 2014-07-30 MED ORDER — LOSARTAN POTASSIUM 100 MG PO TABS: 100.0000 mg | ORAL_TABLET | Freq: Every day | ORAL | Status: DC

## 2014-10-25 ENCOUNTER — Ambulatory Visit (INDEPENDENT_AMBULATORY_CARE_PROVIDER_SITE_OTHER): Payer: Medicare Other | Admitting: Family Medicine

## 2014-10-25 ENCOUNTER — Encounter: Payer: Self-pay | Admitting: Family Medicine

## 2014-10-25 VITALS — BP 140/90 | HR 64 | Temp 98.2°F | Wt 233.0 lb

## 2014-10-25 DIAGNOSIS — J4520 Mild intermittent asthma, uncomplicated: Secondary | ICD-10-CM

## 2014-10-25 MED ORDER — PREDNISONE 10 MG PO TABS: ORAL_TABLET | ORAL | Status: DC

## 2014-10-25 MED ORDER — ALBUTEROL SULFATE HFA 108 (90 BASE) MCG/ACT IN AERS: 2.0000 | INHALATION_SPRAY | Freq: Four times a day (QID) | RESPIRATORY_TRACT | Status: DC | PRN

## 2014-10-25 NOTE — Patient Instructions (Signed)
Follow up for any fever or worsening symptoms. 

## 2014-10-25 NOTE — Progress Notes (Signed)
Subjective:    Patient ID: Lee Smith, male    DOB: 1945-10-11, 69 y.o.   MRN: [id number]  HPI  Acute visit. Patient seen with cough and wheezing. About a week and a half ago he developed typical cold-like symptoms. He has never had any fever. He has some sinus pressure and nasal congestion. Using Flonase. He has some wheezing day and night. Producing mostly clear mucus. No hemoptysis. Nonsmoker. No history of asthma.  Past Medical History  Diagnosis Date  . Hypertension   . Hyperlipidemia   . Genital warts   . Hx of colonic polyps     2008  . History of kidney stones 11/2013    lithotripsy for right ureteral stone  . Allergic rhinitis   . Arthritis     bilateral knees; has planned partial knee replacement next month  . Right nephrolithiasis    Past Surgical History  Procedure Laterality Date  . Cholecystectomy  2002  . Extracorporeal shock wave lithotripsy Right 12-17-2013  . Umbilical hernia repair  2000  . Inguinal hernia repair Bilateral right 2003/   left 2004  . Cystoscopy with retrograde pyelogram, ureteroscopy and stent placement Right 02/12/2014    Procedure: RIGHT URETEROSCOPY ;  Surgeon: Festus Aloe, MD;  Location: [facility];  Service: Urology;  Laterality: Right;  . Cystoscopy w/ ureteral stent placement Right 02/12/2014    Procedure: CYSTOSCOPY WITH RETROGRADE PYELOGRAM/URETERAL STENT PLACEMENT;  Surgeon: Festus Aloe, MD;  Location: [facility];  Service: Urology;  Laterality: Right;  . Cystoscopy with ureteroscopy and stent placement Right 03/05/2014    Procedure: CYSTOSCOPY WITH URETEROSCOPY AND STENT EXCHANGE, STONE EXTRACTION;  Surgeon: Festus Aloe, MD;  Location: [facility];  Service: Urology;  Laterality: Right;  . Holmium laser application Right 8/83/2549    Procedure: HOLMIUM LASER APPLICATION;  Surgeon: Festus Aloe, MD;  Location: [facility];  Service: Urology;  Laterality:  Right;  . Cystoscopy with ureteroscopy and stent placement Right 03/26/2014    Procedure: CYSTO WITH RIGHT URETEROSCOPY/STENT PLACEMENT;  Surgeon: Festus Aloe, MD;  Location: [facility];  Service: Urology;  Laterality: Right;  . Stone extraction with basket Right 03/26/2014    Procedure: STONE EXTRACTION WITH BASKET;  Surgeon: Festus Aloe, MD;  Location: [facility];  Service: Urology;  Laterality: Right;    reports that he quit smoking about 44 years ago. His smoking use included Cigarettes. He has a 6 pack-year smoking history. He has quit using smokeless tobacco. His smokeless tobacco use included Chew. He reports that he drinks about 4.2 oz of alcohol per week. He reports that he does not use illicit drugs. family history includes Arthritis in an other family member; Cancer in his father and sister; Hyperlipidemia in an other family member; Hypertension in an other family member. No Known Allergies    Review of Systems  Constitutional: Negative for fever and chills.  HENT: Positive for congestion.   Respiratory: Positive for cough and wheezing.   Cardiovascular: Negative for chest pain.       Objective:   Physical Exam  Constitutional: He appears well-developed and well-nourished.  Neck: Neck supple.  Cardiovascular: Normal rate and regular rhythm.   Pulmonary/Chest: Effort normal. He has wheezes. He has no rales.  No retractions  Lymphadenopathy:    He has no cervical adenopathy.          Assessment & Plan:  Cough and wheezing likely triggered by viral URI. No  respiratory distress. Prednisone taper. Pro-air inhaler 2 puffs every 6 hours as needed. Follow-up for fever or worsening symptoms

## 2014-10-25 NOTE — Progress Notes (Signed)
Pre visit review using our clinic review tool, if applicable. No additional management support is needed unless otherwise documented below in the visit note. 

## 2014-10-31 DIAGNOSIS — M25562 Pain in left knee: Secondary | ICD-10-CM | POA: Diagnosis not present

## 2014-10-31 DIAGNOSIS — M170 Bilateral primary osteoarthritis of knee: Secondary | ICD-10-CM | POA: Diagnosis not present

## 2014-10-31 DIAGNOSIS — M25461 Effusion, right knee: Secondary | ICD-10-CM | POA: Diagnosis not present

## 2014-10-31 DIAGNOSIS — M25462 Effusion, left knee: Secondary | ICD-10-CM | POA: Diagnosis not present

## 2014-10-31 DIAGNOSIS — Z87891 Personal history of nicotine dependence: Secondary | ICD-10-CM | POA: Diagnosis not present

## 2014-10-31 DIAGNOSIS — M1612 Unilateral primary osteoarthritis, left hip: Secondary | ICD-10-CM | POA: Diagnosis not present

## 2014-10-31 DIAGNOSIS — M25561 Pain in right knee: Secondary | ICD-10-CM | POA: Diagnosis not present

## 2014-10-31 DIAGNOSIS — M179 Osteoarthritis of knee, unspecified: Secondary | ICD-10-CM | POA: Diagnosis not present

## 2014-11-05 DIAGNOSIS — N200 Calculus of kidney: Secondary | ICD-10-CM | POA: Diagnosis not present

## 2015-02-11 DIAGNOSIS — N529 Male erectile dysfunction, unspecified: Secondary | ICD-10-CM | POA: Diagnosis not present

## 2015-02-11 DIAGNOSIS — Z01818 Encounter for other preprocedural examination: Secondary | ICD-10-CM | POA: Diagnosis not present

## 2015-02-11 DIAGNOSIS — J309 Allergic rhinitis, unspecified: Secondary | ICD-10-CM | POA: Diagnosis not present

## 2015-02-11 DIAGNOSIS — M1712 Unilateral primary osteoarthritis, left knee: Secondary | ICD-10-CM | POA: Diagnosis not present

## 2015-02-11 DIAGNOSIS — I10 Essential (primary) hypertension: Secondary | ICD-10-CM | POA: Diagnosis not present

## 2015-02-13 DIAGNOSIS — Z87891 Personal history of nicotine dependence: Secondary | ICD-10-CM | POA: Diagnosis not present

## 2015-02-13 DIAGNOSIS — M1712 Unilateral primary osteoarthritis, left knee: Secondary | ICD-10-CM | POA: Diagnosis not present

## 2015-02-20 ENCOUNTER — Encounter: Payer: Self-pay | Admitting: Family Medicine

## 2015-02-20 ENCOUNTER — Ambulatory Visit (INDEPENDENT_AMBULATORY_CARE_PROVIDER_SITE_OTHER): Payer: Medicare Other | Admitting: Family Medicine

## 2015-02-20 VITALS — HR 65 | Temp 98.3°F | Wt 225.0 lb

## 2015-02-20 DIAGNOSIS — M79631 Pain in right forearm: Secondary | ICD-10-CM | POA: Diagnosis not present

## 2015-02-20 DIAGNOSIS — I10 Essential (primary) hypertension: Secondary | ICD-10-CM | POA: Diagnosis not present

## 2015-02-20 MED ORDER — AMLODIPINE BESYLATE 5 MG PO TABS: 5.0000 mg | ORAL_TABLET | Freq: Every day | ORAL | Status: DC

## 2015-02-20 NOTE — Progress Notes (Signed)
Subjective:    Patient ID: Lee Smith, male    DOB: 04/09/1946, 69 y.o.   MRN: [id number]  HPI Patient seen for follow-up hypertension. He went for preop for upcoming knee replacement and had blood pressure apparently around 672 systolic. He's had several readings 094 to 709 systolic by home readings with diastolics 62-83. Takes losartan 100 mg daily. Rare alcohol use. Quit taking non-steroidal 3-4 days ago. Denies headaches. No dizziness. No chest pains. No peripheral edema issues.  Second issue is right forearm pain. He's had some pain for the past couple weeks. Denies specific injury but has done a lot of lifting and helping someone move. He does not have pain at the elbow site. He has not had any bruising or swelling. Pain with gripping. No weakness. No cervical radiculopathy symptoms. No numbness. Pain is mild to occasionally moderate with activity  Past Medical History  Diagnosis Date  . Hypertension   . Hyperlipidemia   . Genital warts   . Hx of colonic polyps     2008  . History of kidney stones 11/2013    lithotripsy for right ureteral stone  . Allergic rhinitis   . Arthritis     bilateral knees; has planned partial knee replacement next month  . Right nephrolithiasis    Past Surgical History  Procedure Laterality Date  . Cholecystectomy  2002  . Extracorporeal shock wave lithotripsy Right 12-17-2013  . Umbilical hernia repair  2000  . Inguinal hernia repair Bilateral right 2003/   left 2004  . Cystoscopy with retrograde pyelogram, ureteroscopy and stent placement Right 02/12/2014    Procedure: RIGHT URETEROSCOPY ;  Surgeon: Festus Aloe, MD;  Location: [facility];  Service: Urology;  Laterality: Right;  . Cystoscopy w/ ureteral stent placement Right 02/12/2014    Procedure: CYSTOSCOPY WITH RETROGRADE PYELOGRAM/URETERAL STENT PLACEMENT;  Surgeon: Festus Aloe, MD;  Location: [facility];  Service: Urology;  Laterality: Right;  .  Cystoscopy with ureteroscopy and stent placement Right 03/05/2014    Procedure: CYSTOSCOPY WITH URETEROSCOPY AND STENT EXCHANGE, STONE EXTRACTION;  Surgeon: Festus Aloe, MD;  Location: [facility];  Service: Urology;  Laterality: Right;  . Holmium laser application Right 6/62/9476    Procedure: HOLMIUM LASER APPLICATION;  Surgeon: Festus Aloe, MD;  Location: [facility];  Service: Urology;  Laterality: Right;  . Cystoscopy with ureteroscopy and stent placement Right 03/26/2014    Procedure: CYSTO WITH RIGHT URETEROSCOPY/STENT PLACEMENT;  Surgeon: Festus Aloe, MD;  Location: [facility];  Service: Urology;  Laterality: Right;  . Stone extraction with basket Right 03/26/2014    Procedure: STONE EXTRACTION WITH BASKET;  Surgeon: Festus Aloe, MD;  Location: [facility];  Service: Urology;  Laterality: Right;    reports that he quit smoking about 44 years ago. His smoking use included Cigarettes. He has a 6 pack-year smoking history. He has quit using smokeless tobacco. His smokeless tobacco use included Chew. He reports that he drinks about 4.2 oz of alcohol per week. He reports that he does not use illicit drugs. family history includes Arthritis in an other family member; Cancer in his father and sister; Hyperlipidemia in an other family member; Hypertension in an other family member. No Known Allergies    Review of Systems  Constitutional: Negative for fever, chills and fatigue.  Eyes: Negative for visual disturbance.  Respiratory: Negative for cough, chest tightness and shortness of breath.   Cardiovascular: Negative for chest pain,  palpitations and leg swelling.  Neurological: Negative for dizziness, syncope, weakness, light-headedness, numbness and headaches.       Objective:   Physical Exam  Constitutional: He appears well-developed and well-nourished.  Cardiovascular: Normal rate and regular rhythm.     Pulmonary/Chest: Effort normal and breath sounds normal. No respiratory distress. He has no wheezes. He has no rales.  Musculoskeletal: He exhibits no edema.  Right upper extremity full range of motion elbow. No pain with suppuration or pronation. He does not have any tenderness around the medial or lateral epicondylar region. Minimal tenderness along the mid aspect of the extensor muscles of the right forearm. No radial head tenderness. No pain with wrist extension or flexion against resistance.  Neurological:  Full strength upper extremity          Assessment & Plan:  #1 hypertension with exacerbation. Alcohol only in moderation (he never uses in excess of recommended amounts) Add amlodipine 5 mg daily to his losartan. Be in touch by next week if not consistently below 150/90. #2 right forearm pain. Suspect muscular strain. Observe for now and touch base if not improving over the next couple of weeks

## 2015-02-20 NOTE — Progress Notes (Signed)
Pre visit review using our clinic review tool, if applicable. No additional management support is needed unless otherwise documented below in the visit note. 

## 2015-02-20 NOTE — Patient Instructions (Signed)
Monitor blood pressure and be in touch if not < 150/90 by next week.

## 2015-02-27 ENCOUNTER — Ambulatory Visit: Payer: Medicare Other | Attending: Orthopaedic Surgery | Admitting: Physical Therapy

## 2015-02-27 ENCOUNTER — Encounter: Payer: Self-pay | Admitting: Physical Therapy

## 2015-02-27 ENCOUNTER — Telehealth: Payer: Self-pay | Admitting: Family Medicine

## 2015-02-27 DIAGNOSIS — M25662 Stiffness of left knee, not elsewhere classified: Secondary | ICD-10-CM | POA: Insufficient documentation

## 2015-02-27 HISTORY — PX: TOTAL KNEE ARTHROPLASTY: SHX125

## 2015-02-27 MED ORDER — LOSARTAN POTASSIUM 100 MG PO TABS: 100.0000 mg | ORAL_TABLET | Freq: Every day | ORAL | Status: DC

## 2015-02-27 NOTE — Telephone Encounter (Signed)
Pt is still on losartan and needs refills. Pt was seen on 02-20-15- [facility]. Pt is now on two bp med per pt.

## 2015-02-27 NOTE — Therapy (Signed)
[facility] [address], [place], [place], [address] Phone: [phone]   Fax:  [phone]  Physical Therapy Evaluation  Patient Details  Name: Lee Smith MRN: [id number] Date of Birth: December 24, 1945 Referring Provider:  Karlene Einstein, MD  Encounter Date: 02/27/2015      PT End of Session - 02/27/15 1002    Visit Number 1   Date for PT Re-Evaluation 02/27/15   PT Start Time 0930   PT Stop Time 1002   PT Time Calculation (min) 32 min   Activity Tolerance Patient tolerated treatment well   Behavior During Therapy [facility] for tasks assessed/performed      Past Medical History  Diagnosis Date  . Hypertension   . Hyperlipidemia   . Genital warts   . Hx of colonic polyps     2008  . History of kidney stones 11/2013    lithotripsy for right ureteral stone  . Allergic rhinitis   . Arthritis     bilateral knees; has planned partial knee replacement next month  . Right nephrolithiasis     Past Surgical History  Procedure Laterality Date  . Cholecystectomy  2002  . Extracorporeal shock wave lithotripsy Right 12-17-2013  . Umbilical hernia repair  2000  . Inguinal hernia repair Bilateral right 2003/   left 2004  . Cystoscopy with retrograde pyelogram, ureteroscopy and stent placement Right 02/12/2014    Procedure: RIGHT URETEROSCOPY ;  Surgeon: Festus Aloe, MD;  Location: [facility];  Service: Urology;  Laterality: Right;  . Cystoscopy w/ ureteral stent placement Right 02/12/2014    Procedure: CYSTOSCOPY WITH RETROGRADE PYELOGRAM/URETERAL STENT PLACEMENT;  Surgeon: Festus Aloe, MD;  Location: [facility];  Service: Urology;  Laterality: Right;  . Cystoscopy with ureteroscopy and stent placement Right 03/05/2014    Procedure: CYSTOSCOPY WITH URETEROSCOPY AND STENT EXCHANGE, STONE EXTRACTION;  Surgeon: Festus Aloe, MD;  Location: [facility];  Service: Urology;   Laterality: Right;  . Holmium laser application Right 1/66/0630    Procedure: HOLMIUM LASER APPLICATION;  Surgeon: Festus Aloe, MD;  Location: [facility];  Service: Urology;  Laterality: Right;  . Cystoscopy with ureteroscopy and stent placement Right 03/26/2014    Procedure: CYSTO WITH RIGHT URETEROSCOPY/STENT PLACEMENT;  Surgeon: Festus Aloe, MD;  Location: [facility];  Service: Urology;  Laterality: Right;  . Stone extraction with basket Right 03/26/2014    Procedure: STONE EXTRACTION WITH BASKET;  Surgeon: Festus Aloe, MD;  Location: [facility];  Service: Urology;  Laterality: Right;    There were no vitals filed for this visit.  Visit Diagnosis:  Knee stiffness, left - Plan: PT plan of care cert/re-cert      Subjective Assessment - 02/27/15 0935    Subjective Patient reports he is having left TKR on 03/03/2015. . MD wants a pre-op visit to assess mobility prior to surgery. Patient reports he will be in the hospital for 2 days and start therapy in outpatient starting next week on Thursday.     How long can you sit comfortably? sitting with knee flexed too much   How long can you stand comfortably? painfull   How long can you walk comfortably? painful, especially going down hill   Patient Stated Goals Getting preliminary measurements   Currently in Pain? Yes   Pain Score 7   low pain 2/10   Pain Location Knee   Pain Orientation Left   Pain Descriptors /  Indicators Aching;Sharp   Pain Type Chronic pain   Pain Onset More than a month ago   Pain Frequency Intermittent   Aggravating Factors  walking, night time, sitting too long, keeping left knee bent for long drives   Pain Relieving Factors movement   Multiple Pain Sites No            [facility] PT Assessment - 02/27/15 0001    Assessment   Medical Diagnosis M17.12 Primary osteoarthritis of left knee   Onset Date/Surgical Date 08/19/14   Prior Therapy None    Precautions   Precautions None   Balance Screen   Has the patient fallen in the past 6 months No   Has the patient had a decrease in activity level because of a fear of falling?  No   Is the patient reluctant to leave their home because of a fear of falling?  No   Home Environment   Living Environment Private residence   Available Help at Discharge Family   Type of [place] to enter   Entrance Stairs-Number of Steps 1   [place] One level   [place] - 2 wheels;Toilet riser   Prior Function   Level of Independence Independent   Cognition   Overall Cognitive Status Within Functional Limits for tasks assessed   Observation/Other Assessments   Focus on Therapeutic Outcomes (FOTO)  52% limitation   Observation/Other Assessments-Edema    Edema Circumferential   Circumferential Edema   Circumferential - Right 39cm knee joint   Circumferential - Left  41.5cm knee joint   ROM / Strength   AROM / PROM / Strength AROM;PROM;Strength   AROM   AROM Assessment Site Knee   Right/Left Knee Left;Right   Right Knee Extension 0   Right Knee Flexion 130   Left Knee Extension -5  sitting   Left Knee Flexion 125   PROM   PROM Assessment Site Knee   Right/Left Knee Left;Right   Left Knee Extension -5  supine   Left Knee Flexion 128   Strength   Strength Assessment Site Knee   Right/Left Knee Right;Left   Right Knee Flexion 5/5   Right Knee Extension 5/5   Left Knee Flexion 5/5   Left Knee Extension 5/5   Right Knee   Right Knee Extension 0   Right Knee Flexion 135   Ambulation/Gait   Gait Pattern Step-through pattern;Within Functional Limits   Stairs No   Door Management 7: Independent   Ramp 7: Independent   Curb 7: Independent                           PT Education - 02/27/15 1000    Education provided Yes   Education Details PT instructed patient on using an elevated commode, shower chair,  have walker ready, ankle pumps, no  slippery rugs, no sleeping with pillow under knee on back, sleep on side with pillows between knees   Person(s) Educated Patient   Methods Explanation;Verbal cues   Comprehension Verbalized understanding             PT Long Term Goals - 02/27/15 1012    PT LONG TERM GOAL #1   Title education on how to prepare for after surgery   Time 1   Period Days   Status Achieved  Plan - Mar 07, 2015 1003    Clinical Impression Statement Patient is a 69 year old male with diagnosis of primary osteoarthritis of left knee.  Patient has been having increased knee pain in the past 6 months.  Patient is scheduled for left total knee replacement on 03/03/2015.  He will be in the hospital for 2 days and start outpatient rehab on 03/06/2015. FOTO score is 52% limitation.  Patient reports left knee pain at level 2-7/10.  Patient reports pain is worse with sitting too long, walking, walking down hill, standing too long.  AROM of right/left knee as follows in degrees: flexion 130/125 and extension 0/-5 degrees.  PROM of left knee  in degrees right /left: flexion 128/135 and extension 0/-5. Bilateral knee strength is 5/5. Circumference of right/left knee joint: 39 cm/41.5cm. Patient will be discharged today for left TKR on 03/03/2015.    Pt will benefit from skilled therapeutic intervention in order to improve on the following deficits Pain;Decreased mobility;Impaired flexibility;Decreased activity tolerance;Difficulty walking   Rehab Potential Excellent   Clinical Impairments Affecting Rehab Potential None   PT Frequency 1x / week   PT Duration --  1 week   PT Treatment/Interventions ADLs/Self Care Home Management   PT Next Visit Plan Discharge   PT Home Exercise Plan None   Recommended Other Services None   Consulted and Agree with Plan of Care Patient          G-Codes - 07-Mar-2015 0930    Functional Assessment Tool Used FOTO 52% limitation    Functional Limitation Mobility: Walking and moving around   Mobility: Walking and Moving Around Current Status [phone]) At least 40 percent but less than 60 percent impaired, limited or restricted   Mobility: Walking and Moving Around Goal Status [phone]) At least 40 percent but less than 60 percent impaired, limited or restricted   Mobility: Walking and Moving Around Discharge Status [phone]) At least 40 percent but less than 60 percent impaired, limited or restricted       Problem List Patient Active Problem List   Diagnosis Date Noted  . Adjustment disorder with depressed mood 05/30/2013  . Obesity (BMI 30-39.9) 04/23/2013  . RBBB 10/10/2012  . CONSTIPATION 07/28/2010  . LATERAL EPICONDYLITIS 01/12/2010  . LUMBAGO 06/23/2009  . ACTINIC KERATOSIS 03/19/2009  . CATARACT EXTRACTION STATUS 03/19/2009  . Hyperlipidemia 11/01/2008  . 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 UNSPECIFIED HEARING LOSS 11/01/2008  . Essential hypertension 11/01/2008  . ROTATOR CUFF SYNDROME, 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    GRAY,CHERYL,PT 03/07/15, 10:14 AM  [facility] [address] [place], [place], [address] Phone: [phone]   Fax:  [phone]

## 2015-02-27 NOTE — Telephone Encounter (Signed)
Rx sent to pharmacy   

## 2015-03-03 DIAGNOSIS — J309 Allergic rhinitis, unspecified: Secondary | ICD-10-CM | POA: Diagnosis present

## 2015-03-03 DIAGNOSIS — Z9049 Acquired absence of other specified parts of digestive tract: Secondary | ICD-10-CM | POA: Diagnosis present

## 2015-03-03 DIAGNOSIS — G8918 Other acute postprocedural pain: Secondary | ICD-10-CM | POA: Diagnosis not present

## 2015-03-03 DIAGNOSIS — M1712 Unilateral primary osteoarthritis, left knee: Secondary | ICD-10-CM | POA: Diagnosis not present

## 2015-03-03 DIAGNOSIS — Z79899 Other long term (current) drug therapy: Secondary | ICD-10-CM | POA: Diagnosis not present

## 2015-03-03 DIAGNOSIS — M25562 Pain in left knee: Secondary | ICD-10-CM | POA: Diagnosis not present

## 2015-03-03 DIAGNOSIS — N529 Male erectile dysfunction, unspecified: Secondary | ICD-10-CM | POA: Diagnosis present

## 2015-03-03 DIAGNOSIS — Z87891 Personal history of nicotine dependence: Secondary | ICD-10-CM | POA: Diagnosis not present

## 2015-03-03 DIAGNOSIS — Z8249 Family history of ischemic heart disease and other diseases of the circulatory system: Secondary | ICD-10-CM | POA: Diagnosis not present

## 2015-03-03 DIAGNOSIS — I10 Essential (primary) hypertension: Secondary | ICD-10-CM | POA: Diagnosis present

## 2015-03-06 ENCOUNTER — Ambulatory Visit: Payer: Medicare Other | Attending: Orthopaedic Surgery

## 2015-03-06 DIAGNOSIS — M25662 Stiffness of left knee, not elsewhere classified: Secondary | ICD-10-CM | POA: Insufficient documentation

## 2015-03-06 DIAGNOSIS — R269 Unspecified abnormalities of gait and mobility: Secondary | ICD-10-CM | POA: Diagnosis not present

## 2015-03-06 DIAGNOSIS — M25562 Pain in left knee: Secondary | ICD-10-CM | POA: Insufficient documentation

## 2015-03-06 DIAGNOSIS — R29898 Other symptoms and signs involving the musculoskeletal system: Secondary | ICD-10-CM | POA: Insufficient documentation

## 2015-03-06 DIAGNOSIS — M7989 Other specified soft tissue disorders: Secondary | ICD-10-CM | POA: Insufficient documentation

## 2015-03-06 NOTE — Patient Instructions (Signed)
Knee Extension: Short Arc (Eccentric) - Supine or Sitting   Lie on back with roll under knee. Extend knee. Slowly lower foot for 3-5 seconds. _10__ reps per set, 4-_5__ sets per day, _7__ days per week.   Copyright  VHI. All rights reserved.  Quad Set   With other leg bent, foot flat, slowly tighten muscles on thigh of straight leg while counting out loud to _5___. Repeat _10-20___ times. Do _4-5 times a day. Hip Flexion / Knee Extension:   Heel Slides   Use a strap or towel to Slide left heel along bed towards bottom. Hold for _10_ seconds. Perform gently. Slide back to flat knee position. Repeat 10___ times. Do _4-5__ times a day.   [place] [address], [place], [place] [address] Phone # [phone] Fax [phone]

## 2015-03-06 NOTE — Therapy (Signed)
[facility] [address], [place], [place], [address] Phone: [phone]   Fax:  [phone]  Physical Therapy Evaluation  Patient Details  Name: Lee Smith MRN: [id number] Date of Birth: 1946/05/12 Referring Provider:  Karlene Einstein, MD  Encounter Date: 03/06/2015      PT End of Session - 03/06/15 1053    Visit Number 1   Number of Visits 10   Date for PT Re-Evaluation 05/01/15   PT Start Time 7619   PT Stop Time 1105   PT Time Calculation (min) 46 min   Activity Tolerance Patient tolerated treatment well   Behavior During Therapy [facility] for tasks assessed/performed      Past Medical History  Diagnosis Date  . Hypertension   . Hyperlipidemia   . Genital warts   . Hx of colonic polyps     2008  . History of kidney stones 11/2013    lithotripsy for right ureteral stone  . Allergic rhinitis   . Arthritis     bilateral knees; has planned partial knee replacement next month  . Right nephrolithiasis     Past Surgical History  Procedure Laterality Date  . Cholecystectomy  2002  . Extracorporeal shock wave lithotripsy Right 12-17-2013  . Umbilical hernia repair  2000  . Inguinal hernia repair Bilateral right 2003/   left 2004  . Cystoscopy with retrograde pyelogram, ureteroscopy and stent placement Right 02/12/2014    Procedure: RIGHT URETEROSCOPY ;  Surgeon: Festus Aloe, MD;  Location: [facility];  Service: Urology;  Laterality: Right;  . Cystoscopy w/ ureteral stent placement Right 02/12/2014    Procedure: CYSTOSCOPY WITH RETROGRADE PYELOGRAM/URETERAL STENT PLACEMENT;  Surgeon: Festus Aloe, MD;  Location: [facility];  Service: Urology;  Laterality: Right;  . Cystoscopy with ureteroscopy and stent placement Right 03/05/2014    Procedure: CYSTOSCOPY WITH URETEROSCOPY AND STENT EXCHANGE, STONE EXTRACTION;  Surgeon: Festus Aloe, MD;  Location: [facility];  Service: Urology;  Laterality: Right;  . Holmium laser application Right 11/24/3265    Procedure: HOLMIUM LASER APPLICATION;  Surgeon: Festus Aloe, MD;  Location: [facility];  Service: Urology;  Laterality: Right;  . Cystoscopy with ureteroscopy and stent placement Right 03/26/2014    Procedure: CYSTO WITH RIGHT URETEROSCOPY/STENT PLACEMENT;  Surgeon: Festus Aloe, MD;  Location: [facility];  Service: Urology;  Laterality: Right;  . Stone extraction with basket Right 03/26/2014    Procedure: STONE EXTRACTION WITH BASKET;  Surgeon: Festus Aloe, MD;  Location: [facility];  Service: Urology;  Laterality: Right;  . Total knee arthroplasty Left 02/27/2015    There were no vitals filed for this visit.  Visit Diagnosis:  Knee stiffness, left - Plan: PT plan of care cert/re-cert  Swelling of limb - Plan: PT plan of care cert/re-cert  Abnormality of gait - Plan: PT plan of care cert/re-cert  Knee pain, acute, left - Plan: PT plan of care cert/re-cert  Weakness of left lower extremity - Plan: PT plan of care cert/re-cert      Subjective Assessment - 03/06/15 1022    Subjective Pt presents to PT s/p Lt TKA 03/03/15 due to OA.  Pt had pre-op PT for 1 visit at this clinic.     Limitations Standing;Walking   Patient Stated Goals reduce Lt knee pain, walk and stand without limitation, improve Lt knee flexion   Currently in Pain? Yes   Pain Score 5  1-6/10 throughout the day   Pain Location Knee   Pain Orientation Left   Pain Descriptors / Indicators Aching;Sharp   Pain Type Surgical pain   Pain Onset In the past 7 days   Pain Frequency Constant   Aggravating Factors  bending the knee, walking   Pain Relieving Factors pain medication, ice, elevation   Multiple Pain Sites No            [facility] PT Assessment - 03/06/15 0001    Assessment   Medical Diagnosis s/p Lt TKA   Onset Date/Surgical Date 03/03/15   Next MD Visit  03/18/15   Prior Therapy none   Precautions   Precautions None   Balance Screen   Has the patient fallen in the past 6 months No   Has the patient had a decrease in activity level because of a fear of falling?  No   Home Environment   Living Environment Private residence   Living Arrangements Spouse/significant other   Type of [place] to enter   Entrance Stairs-Number of Steps 1   [place] One level   [place] - 2 wheels;Cane - single point;Toilet riser;Shower seat   Prior Function   Level of Independence Independent   Vocation Retired   Leisure yard work, Architect working, walking, hiking   Cognition   Overall Cognitive Status Within [employer] for tasks assessed   Observation/Other Assessments   Focus on Therapeutic Outcomes (FOTO)  62% limitation   Observation/Other Assessments-Edema    Edema Circumferential   Circumferential Edema   Circumferential - Right 39cm knee joint   Circumferential - Left  46.5 cm   Posture/Postural Control   Posture/Postural Control No significant limitations   ROM / Strength   AROM / PROM / Strength AROM   AROM   AROM Assessment Site Knee   Right/Left Knee Right;Left   Right Knee Extension 0   Right Knee Flexion 130   Left Knee Extension 8   Left Knee Flexion 87   PROM   PROM Assessment Site Knee   Right/Left Knee Left   Left Knee Extension 6   Left Knee Flexion 89   Strength   Strength Assessment Site Knee   Right/Left Knee Right;Left   Right Knee Flexion 5/5   Right Knee Extension 5/5   Left Knee Flexion 4/5   Left Knee Extension 4/5   Palpation   Patella mobility reduced patellar mobility in all directions   Palpation comment Pt with surgical dressing still present so not able to assess the scar.  Warmth and edema noted about the joint line   Ambulation/Gait   Ambulation/Gait Yes   Ambulation/Gait Assistance 6: Modified independent (Device/Increase time)    Ambulation Distance (Feet) 100 Feet   Assistive device Straight cane   Gait Pattern Step-through pattern;Decreased step length - left;Decreased stance time - left;Decreased hip/knee flexion - left   Stairs No                   [facility] Adult PT Treatment/Exercise - 03/06/15 0001    Modalities   Modalities Vasopneumatic   Vasopneumatic   Number Minutes Vasopneumatic  15 minutes   Vasopnuematic Location  Knee   Vasopneumatic Pressure Medium   Vasopneumatic Temperature  3 snowflakes                PT Education - 03/06/15 1044    Education provided Yes  Education Details Level 1 knee   Person(s) Educated Patient   Methods Explanation;Demonstration;Handout   Comprehension Verbalized understanding;Returned demonstration          PT Short Term Goals - 03/06/15 1057    PT SHORT TERM GOAL #1   Title be independent in initial HEP   Time 4   Period Weeks   Status New   PT SHORT TERM GOAL #2   Title demonstrate Lt knee AROM flexion to > or = to 100 degrees to sit with comfort   Time 4   Period Weeks   Status New   PT SHORT TERM GOAL #3   Title wean from walker for all distances and demonstrate symmetry with use of cane on level surfaces   Time 4   Period Weeks   Status New   PT SHORT TERM GOAL #4   Title report < or = to 3/10 Lt knee pain with standing and walking   Time 4   Period Weeks   Status New           PT Long Term Goals - 03/06/15 1058    PT LONG TERM GOAL #1   Title be independent in advanced HEP   Time 8   Period Weeks   Status New   PT LONG TERM GOAL #2   Title reduce FOTO to < or = to 44% limitation   Time 8   Period Weeks   Status New   PT LONG TERM GOAL #3   Title demonstrate Lt knee extension to < or = to 3 degrees to normalize gait pattern   Time 8   Period Weeks   Status New   PT LONG TERM GOAL #4   Title wean from cane to no device > or = to 50% in the community and demonstrate symmetry with ambulation   Time 8   Period  Weeks   Status New   PT LONG TERM GOAL #5   Title improve Lt knee strength to 5/5 to allow for community ambulation without limitation   Time 8   Period Weeks   Status New   Additional Long Term Goals   Additional Long Term Goals Yes   PT LONG TERM GOAL #6   Title demonstrate Lt knee AROM flexion to 120 degrees to improve squatting and negotiating steps   Time 8   Period Weeks   Status New               Plan - 03/06/15 1054    Clinical Impression Statement Pt presents to PT 4 days s/p Lt Total knee replacement.  Pt demonstrates Lt knee AROM 5-87 degrees, 4/5 strength, gait abnormality and 5 cm of edema vs the Rt.  FOTO score is 44% limitation.  Pt will benfit from skilled PT for ROM, strength and gait training and edema/pain management as needed to allow for return to prior level of function.     Pt will benefit from skilled therapeutic intervention in order to improve on the following deficits Abnormal gait;Decreased range of motion;Difficulty walking;Decreased activity tolerance;Pain;Increased edema;Decreased strength;Decreased scar mobility;Impaired flexibility   Rehab Potential Good   PT Frequency 2x / week   PT Duration 8 weeks   PT Treatment/Interventions ADLs/Self Care Home Management;Cryotherapy;Electrical Stimulation;Functional mobility training;Stair training;Gait training;Therapeutic activities;Therapeutic exercise;Neuromuscular re-education;Patient/family education;Passive range of motion;Scar mobilization;Manual techniques;Taping;Vasopneumatic Device   PT Next Visit Plan Continue game ready, Lt knee ROM, strength and gait training   Consulted and Agree with Plan of Care  Patient          G-Codes - 03/06/15 1018    Functional Assessment Tool Used FOTO: 62% limitation   Functional Limitation Mobility: Walking and moving around   Mobility: Walking and Moving Around Current Status [phone]) At least 60 percent but less than 80 percent impaired, limited or restricted    Mobility: Walking and Moving Around Goal Status [phone]) At least 40 percent but less than 60 percent impaired, limited or restricted       Problem List Patient Active Problem List   Diagnosis Date Noted  . Adjustment disorder with depressed mood 05/30/2013  . Obesity (BMI 30-39.9) 04/23/2013  . RBBB 10/10/2012  . CONSTIPATION 07/28/2010  . LATERAL EPICONDYLITIS 01/12/2010  . LUMBAGO 06/23/2009  . ACTINIC KERATOSIS 03/19/2009  . CATARACT EXTRACTION STATUS 03/19/2009  . Hyperlipidemia 11/01/2008  . 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 UNSPECIFIED HEARING LOSS 11/01/2008  . Essential hypertension 11/01/2008  . ROTATOR CUFF SYNDROME, 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    Rodriquez Thorner, PT 03/06/2015, 11:03 AM  [facility] [address] [place], [place], [address] Phone: [phone]   Fax:  [phone]

## 2015-03-10 ENCOUNTER — Ambulatory Visit: Payer: Medicare Other

## 2015-03-10 DIAGNOSIS — M25662 Stiffness of left knee, not elsewhere classified: Secondary | ICD-10-CM | POA: Diagnosis not present

## 2015-03-10 DIAGNOSIS — R269 Unspecified abnormalities of gait and mobility: Secondary | ICD-10-CM | POA: Diagnosis not present

## 2015-03-10 DIAGNOSIS — R29898 Other symptoms and signs involving the musculoskeletal system: Secondary | ICD-10-CM

## 2015-03-10 DIAGNOSIS — M7989 Other specified soft tissue disorders: Secondary | ICD-10-CM

## 2015-03-10 DIAGNOSIS — M25562 Pain in left knee: Secondary | ICD-10-CM

## 2015-03-10 NOTE — Therapy (Signed)
[facility] [address] [place], [place], [address] Phone: [phone]   Fax:  [phone]  Physical Therapy Treatment  Patient Details  Name: Lee Smith MRN: [id number] Date of Birth: 1946/05/11 Referring Provider:  Karlene Einstein, MD  Encounter Date: 03/10/2015      PT End of Session - 03/10/15 1000    Visit Number 2   Number of Visits 10   Date for PT Re-Evaluation 05/01/15   PT Start Time 0928   PT Stop Time 1025   PT Time Calculation (min) 57 min   Activity Tolerance Patient tolerated treatment well   Behavior During Therapy [facility] for tasks assessed/performed      Past Medical History  Diagnosis Date  . Hypertension   . Hyperlipidemia   . Genital warts   . Hx of colonic polyps     2008  . History of kidney stones 11/2013    lithotripsy for right ureteral stone  . Allergic rhinitis   . Arthritis     bilateral knees; has planned partial knee replacement next month  . Right nephrolithiasis     Past Surgical History  Procedure Laterality Date  . Cholecystectomy  2002  . Extracorporeal shock wave lithotripsy Right 12-17-2013  . Umbilical hernia repair  2000  . Inguinal hernia repair Bilateral right 2003/   left 2004  . Cystoscopy with retrograde pyelogram, ureteroscopy and stent placement Right 02/12/2014    Procedure: RIGHT URETEROSCOPY ;  Surgeon: Festus Aloe, MD;  Location: [facility];  Service: Urology;  Laterality: Right;  . Cystoscopy w/ ureteral stent placement Right 02/12/2014    Procedure: CYSTOSCOPY WITH RETROGRADE PYELOGRAM/URETERAL STENT PLACEMENT;  Surgeon: Festus Aloe, MD;  Location: [facility];  Service: Urology;  Laterality: Right;  . Cystoscopy with ureteroscopy and stent placement Right 03/05/2014    Procedure: CYSTOSCOPY WITH URETEROSCOPY AND STENT EXCHANGE, STONE EXTRACTION;  Surgeon: Festus Aloe, MD;  Location: [facility];  Service: Urology;  Laterality: Right;  . Holmium laser application Right 5/63/8756    Procedure: HOLMIUM LASER APPLICATION;  Surgeon: Festus Aloe, MD;  Location: [facility];  Service: Urology;  Laterality: Right;  . Cystoscopy with ureteroscopy and stent placement Right 03/26/2014    Procedure: CYSTO WITH RIGHT URETEROSCOPY/STENT PLACEMENT;  Surgeon: Festus Aloe, MD;  Location: [facility];  Service: Urology;  Laterality: Right;  . Stone extraction with basket Right 03/26/2014    Procedure: STONE EXTRACTION WITH BASKET;  Surgeon: Festus Aloe, MD;  Location: [facility];  Service: Urology;  Laterality: Right;  . Total knee arthroplasty Left 02/27/2015    There were no vitals filed for this visit.  Visit Diagnosis:  Knee stiffness, left  Swelling of limb  Abnormality of gait  Knee pain, acute, left  Weakness of left lower extremity      Subjective Assessment - 03/10/15 0927    Subjective Pt walked in today without device.  Mild antalgia.  Pt can take off bandage today.   Limitations Sitting;Walking   How long can you sit comfortably? sitting with knee flexed too much   How long can you stand comfortably? painfull   How long can you walk comfortably? painful, especially going down hill   Currently in Pain? Yes   Pain Score 3    Pain Location Knee   Pain Orientation Left   Pain Descriptors / Indicators Aching;Sharp   Pain Onset 1 to 4 weeks  ago   Pain Frequency Constant   Aggravating Factors  bending the knee, walking on it, walking   Pain Relieving Factors pain medication, ice, elevation            [facility] PT Assessment - 03/10/15 0001    AROM   AROM Assessment Site Knee   Right/Left Knee Left   Left Knee Extension 6   Left Knee Flexion 97                     [facility] Adult PT Treatment/Exercise - 03/10/15 0001    Exercises   Exercises Knee/Hip   Knee/Hip Exercises: Stretches   Active  Hamstring Stretch 3 reps;20 seconds;Left   Knee/Hip Exercises: Aerobic   Stationary Bike Level 0 for ROMx 8 minutes with prolonged stretch   Nustep Level 3x 8 minutes   seat 10, arms 11   Knee/Hip Exercises: Standing   Rebounder weightshifting 3 ways x 3 ways   Knee/Hip Exercises: Supine   Quad Sets Strengthening;Left;10 reps;Other (comment)  5 second hold   Short Arc [employer];Left;10 reps   Heel Slides AAROM;Left;10 reps   Modalities   Modalities Vasopneumatic   Vasopneumatic   Number Minutes Vasopneumatic  15 minutes   Vasopnuematic Location  Knee   Vasopneumatic Pressure Medium   Vasopneumatic Temperature  3 snowflakes                  PT Short Term Goals - 03/10/15 0934    PT SHORT TERM GOAL #1   Title be independent in initial HEP   Time 4   Period Weeks   Status On-going   PT SHORT TERM GOAL #2   Title demonstrate Lt knee AROM flexion to > or = to 100 degrees to sit with comfort   Time 4   Period Weeks   Status On-going  97 degrees   PT SHORT TERM GOAL #3   Title wean from walker for all distances and demonstrate symmetry with use of cane on level surfaces   Time 4   Period Weeks   Status Achieved  Pt walking without device on level surface   PT SHORT TERM GOAL #4   Title report < or = to 3/10 Lt knee pain with standing and walking   Time 4   Period Weeks   Status On-going  3-4/10 today           PT Long Term Goals - 03/06/15 1058    PT LONG TERM GOAL #1   Title be independent in advanced HEP   Time 8   Period Weeks   Status New   PT LONG TERM GOAL #2   Title reduce FOTO to < or = to 44% limitation   Time 8   Period Weeks   Status New   PT LONG TERM GOAL #3   Title demonstrate Lt knee extension to < or = to 3 degrees to normalize gait pattern   Time 8   Period Weeks   Status New   PT LONG TERM GOAL #4   Title wean from cane to no device > or = to 50% in the community and demonstrate symmetry with ambulation    Time 8   Period Weeks   Status New   PT LONG TERM GOAL #5   Title improve Lt knee strength to 5/5 to allow for community ambulation without limitation   Time 8   Period Weeks   Status New  Additional Long Term Goals   Additional Long Term Goals Yes   PT LONG TERM GOAL #6   Title demonstrate Lt knee AROM flexion to 120 degrees to improve squatting and negotiating steps   Time 8   Period Weeks   Status New               Plan - 03/10/15 0935    Clinical Impression Statement Pt is 1 week post-op.  Pt is ambulating on level surface without device with only mild antalgia.  Pt with 3-4/10 Lt knee pain with walking. Pt with 97 degrees of Lt knee flexion today.  Pt with only 1 session after evaluation so limited progress toward goals.  Pt with continued Lt knee stiffness, weakness, edema and pain s/p surgery and will benefit from skilled PT for return to prior level of function.     Pt will benefit from skilled therapeutic intervention in order to improve on the following deficits Abnormal gait;Decreased range of motion;Difficulty walking;Decreased activity tolerance;Pain;Increased edema;Decreased strength;Decreased scar mobility;Impaired flexibility   Rehab Potential Good   PT Frequency 2x / week   PT Duration 8 weeks   PT Treatment/Interventions ADLs/Self Care Home Management;Cryotherapy;Electrical Stimulation;Functional mobility training;Stair training;Gait training;Therapeutic activities;Therapeutic exercise;Neuromuscular re-education;Patient/family education;Passive range of motion;Scar mobilization;Manual techniques;Taping;Vasopneumatic Device   PT Next Visit Plan Continue game ready, Lt knee ROM, strength and gait training   Consulted and Agree with Plan of Care Patient        Problem List Patient Active Problem List   Diagnosis Date Noted  . Adjustment disorder with depressed mood 05/30/2013  . Obesity (BMI 30-39.9) 04/23/2013  . RBBB 10/10/2012  . CONSTIPATION 07/28/2010   . LATERAL EPICONDYLITIS 01/12/2010  . LUMBAGO 06/23/2009  . ACTINIC KERATOSIS 03/19/2009  . CATARACT EXTRACTION STATUS 03/19/2009  . Hyperlipidemia 11/01/2008  . 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 UNSPECIFIED HEARING LOSS 11/01/2008  . Essential hypertension 11/01/2008  . ROTATOR CUFF SYNDROME, 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    TAKACS,KELLY, PT 03/10/2015, 10:06 AM  [facility] [address], [place], [place], [address] Phone: [phone]   Fax:  [phone]

## 2015-03-12 ENCOUNTER — Encounter: Payer: Self-pay | Admitting: Physical Therapy

## 2015-03-12 ENCOUNTER — Ambulatory Visit: Payer: Medicare Other | Admitting: Physical Therapy

## 2015-03-12 DIAGNOSIS — M7989 Other specified soft tissue disorders: Secondary | ICD-10-CM | POA: Diagnosis not present

## 2015-03-12 DIAGNOSIS — R29898 Other symptoms and signs involving the musculoskeletal system: Secondary | ICD-10-CM

## 2015-03-12 DIAGNOSIS — M25662 Stiffness of left knee, not elsewhere classified: Secondary | ICD-10-CM | POA: Diagnosis not present

## 2015-03-12 DIAGNOSIS — M25562 Pain in left knee: Secondary | ICD-10-CM

## 2015-03-12 DIAGNOSIS — R269 Unspecified abnormalities of gait and mobility: Secondary | ICD-10-CM

## 2015-03-12 NOTE — Therapy (Signed)
[facility] [address] [place], [place], [address] Phone: [phone]   Fax:  [phone]  Physical Therapy Treatment  Patient Details  Name: Lee Smith MRN: [id number] Date of Birth: [birth year] Referring Provider:  Karlene Einstein, MD  Encounter Date: 03/12/2015      PT End of Session - 03/12/15 1048    Visit Number 3   Number of Visits 10   Date for PT Re-Evaluation 05/01/15   PT Start Time 1010   PT Stop Time 1116   PT Time Calculation (min) 66 min   Activity Tolerance Patient tolerated treatment well   Behavior During Therapy [facility] for tasks assessed/performed      Past Medical History  Diagnosis Date  . Hypertension   . Hyperlipidemia   . Genital warts   . Hx of colonic polyps     2008  . History of kidney stones 11/2013    lithotripsy for right ureteral stone  . Allergic rhinitis   . Arthritis     bilateral knees; has planned partial knee replacement next month  . Right nephrolithiasis     Past Surgical History  Procedure Laterality Date  . Cholecystectomy  2002  . Extracorporeal shock wave lithotripsy Right 12-17-2013  . Umbilical hernia repair  2000  . Inguinal hernia repair Bilateral right 2003/   left 2004  . Cystoscopy with retrograde pyelogram, ureteroscopy and stent placement Right 02/12/2014    Procedure: RIGHT URETEROSCOPY ;  Surgeon: Festus Aloe, MD;  Location: [facility];  Service: Urology;  Laterality: Right;  . Cystoscopy w/ ureteral stent placement Right 02/12/2014    Procedure: CYSTOSCOPY WITH RETROGRADE PYELOGRAM/URETERAL STENT PLACEMENT;  Surgeon: Festus Aloe, MD;  Location: [facility];  Service: Urology;  Laterality: Right;  . Cystoscopy with ureteroscopy and stent placement Right 03/05/2014    Procedure: CYSTOSCOPY WITH URETEROSCOPY AND STENT EXCHANGE, STONE EXTRACTION;  Surgeon: Festus Aloe, MD;  Location: [facility];  Service: Urology;  Laterality: Right;  . Holmium laser application Right 9/32/3557    Procedure: HOLMIUM LASER APPLICATION;  Surgeon: Festus Aloe, MD;  Location: [facility];  Service: Urology;  Laterality: Right;  . Cystoscopy with ureteroscopy and stent placement Right 03/26/2014    Procedure: CYSTO WITH RIGHT URETEROSCOPY/STENT PLACEMENT;  Surgeon: Festus Aloe, MD;  [facility];  Service: Urology;  Laterality: Right;  . Stone extraction with basket Right 03/26/2014    Procedure: STONE EXTRACTION WITH BASKET;  Surgeon: Festus Aloe, MD;  Location: [facility];  Service: Urology;  Laterality: Right;  . Total knee arthroplasty Left 02/27/2015    There were no vitals filed for this visit.  Visit Diagnosis:  Knee stiffness, left  Swelling of limb  Abnormality of gait  Knee pain, acute, left  Weakness of left lower extremity      Subjective Assessment - 03/12/15 1037    Subjective Pt is walking without assistive device, mild antalgic gait. Pt is active and notices increase swelling.   Currently in Pain? Yes   Pain Score 2   up to 4 with certain positions   Pain Location Knee   Pain Orientation Left   Pain Descriptors / Indicators Aching   Pain Type Surgical pain   Pain Onset 1 to 4 weeks ago   Pain Frequency Constant   Aggravating Factors  bending the knee, walking and standing   Pain Relieving Factors pain meds, ice, elevation  Multiple Pain Sites No                         [facility] Adult PT Treatment/Exercise - 03/12/15 0001    Bed Mobility   Bed Mobility --  wishes to be called [name]   Exercises   Exercises Knee/Hip   Knee/Hip Exercises: Stretches   Active Hamstring Stretch 3 reps;20 seconds;Left  standing at stairs   Knee/Hip Exercises: Aerobic   Stationary Bike Level 0 for ROMx 6 minutes with prolonged stretch   Nustep Level 3x 12 minutes   seat 10, arms 11   Knee/Hip  Exercises: Machines for Strengthening   Total Gym Leg Press St#8, 50# B LE 3x10   Knee/Hip Exercises: Standing   Rebounder weightshifting 3 ways x 28min each   Knee/Hip Exercises: Seated   Long Arc Quad Left;2 sets;10 reps   Knee/Hip Exercises: Supine   Quad Sets Strengthening;Left;3 sets;10 reps  with 5 sec hold   Short Arc [employer] Strengthening;AROM;Left;3 sets;10 reps  with 2 sec hold   Heel Slides AAROM;Left;3 sets;10 reps   Modalities   Modalities Vasopneumatic   Vasopneumatic   Number Minutes Vasopneumatic  15 minutes   Vasopnuematic Location  Knee   Vasopneumatic Pressure Medium   Vasopneumatic Temperature  3 snowflakes                  PT Short Term Goals - 03/12/15 1054    PT SHORT TERM GOAL #1   Title be independent in initial HEP   Time 4   Period Weeks   Status On-going   PT SHORT TERM GOAL #2   Title demonstrate Lt knee AROM flexion to > or = to 100 degrees to sit with comfort   Time 4   Period Weeks   Status On-going   PT SHORT TERM GOAL #3   Title wean from walker for all distances and demonstrate symmetry with use of cane on level surfaces   Time 4   Period Weeks   Status Achieved   PT SHORT TERM GOAL #4   Title report < or = to 3/10 Lt knee pain with standing and walking   Time 4   Period Weeks           PT Long Term Goals - 03/06/15 1058    PT LONG TERM GOAL #1   Title be independent in advanced HEP   Time 8   Period Weeks   Status New   PT LONG TERM GOAL #2   Title reduce FOTO to < or = to 44% limitation   Time 8   Period Weeks   Status New   PT LONG TERM GOAL #3   Title demonstrate Lt knee extension to < or = to 3 degrees to normalize gait pattern   Time 8   Period Weeks   Status New   PT LONG TERM GOAL #4   Title wean from cane to no device > or = to 50% in the community and demonstrate symmetry with ambulation   Time 8   Period Weeks   Status New   PT LONG TERM GOAL #5   Title improve Lt knee strength to 5/5 to  allow for community ambulation without limitation   Time 8   Period Weeks   Status New   Additional Long Term Goals   Additional Long Term Goals Yes   PT LONG TERM GOAL #6   Title demonstrate Lt  knee AROM flexion to 120 degrees to improve squatting and negotiating steps   Time 8   Period Weeks   Status New               Plan - 03/12/15 1048    Clinical Impression Statement Pt is in second week after surgery. Pt is ambulating without device with mild antalgic gait. Pt with edema and bruises from surgery procedure. Pt was able to perform leg press with B LE 50#. Pt will continue to benefit from skilled PT    Pt will benefit from skilled therapeutic intervention in order to improve on the following deficits Abnormal gait;Decreased range of motion;Difficulty walking;Decreased activity tolerance;Pain;Increased edema;Decreased strength;Decreased scar mobility;Impaired flexibility   Rehab Potential Good   PT Frequency 2x / week   PT Duration 8 weeks   PT Treatment/Interventions ADLs/Self Care Home Management;Cryotherapy;Electrical Stimulation;Functional mobility training;Stair training;Gait training;Therapeutic activities;Therapeutic exercise;Neuromuscular re-education;Patient/family education;Passive range of motion;Scar mobilization;Manual techniques;Taping;Vasopneumatic Device   PT Next Visit Plan Continue game ready, Lt knee ROM, strength and gait training   Consulted and Agree with Plan of Care Patient        Problem List Patient Active Problem List   Diagnosis Date Noted  . Adjustment disorder with depressed mood 05/30/2013  . Obesity (BMI 30-39.9) 04/23/2013  . RBBB 10/10/2012  . CONSTIPATION 07/28/2010  . LATERAL EPICONDYLITIS 01/12/2010  . LUMBAGO 06/23/2009  . ACTINIC KERATOSIS 03/19/2009  . CATARACT EXTRACTION STATUS 03/19/2009  . Hyperlipidemia 11/01/2008  . 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 UNSPECIFIED HEARING LOSS 11/01/2008  . Essential hypertension 11/01/2008  .  ROTATOR CUFF SYNDROME, 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    NAUMANN-HOUEGNIFIO,Leshon Armistead PTA 03/12/2015, 11:02 AM  [facility] [address] [place], [place], [address] Phone: [phone]   Fax:  [phone]

## 2015-03-17 ENCOUNTER — Ambulatory Visit: Payer: Medicare Other | Admitting: Physical Therapy

## 2015-03-17 ENCOUNTER — Encounter: Payer: Self-pay | Admitting: Physical Therapy

## 2015-03-17 DIAGNOSIS — R269 Unspecified abnormalities of gait and mobility: Secondary | ICD-10-CM | POA: Diagnosis not present

## 2015-03-17 DIAGNOSIS — M25662 Stiffness of left knee, not elsewhere classified: Secondary | ICD-10-CM

## 2015-03-17 DIAGNOSIS — R29898 Other symptoms and signs involving the musculoskeletal system: Secondary | ICD-10-CM

## 2015-03-17 DIAGNOSIS — M25562 Pain in left knee: Secondary | ICD-10-CM | POA: Diagnosis not present

## 2015-03-17 DIAGNOSIS — M7989 Other specified soft tissue disorders: Secondary | ICD-10-CM | POA: Diagnosis not present

## 2015-03-17 NOTE — Therapy (Signed)
[facility] [address] [place], [place], [address] Phone: [phone]   Fax:  [phone]  Physical Therapy Treatment  Patient Details  Name: Lee Smith MRN: [id number] Date of Birth: [birth year] Referring Provider:  Karlene Einstein, MD  Encounter Date: 03/17/2015      PT End of Session - 03/17/15 0939    Visit Number 4   Number of Visits 10   Date for PT Re-Evaluation 05/01/15   PT Start Time 0922   PT Stop Time 1115   PT Time Calculation (min) 113 min   Activity Tolerance Patient tolerated treatment well   Behavior During Therapy [facility] for tasks assessed/performed      Past Medical History  Diagnosis Date  . Hypertension   . Hyperlipidemia   . Genital warts   . Hx of colonic polyps     2008  . History of kidney stones 11/2013    lithotripsy for right ureteral stone  . Allergic rhinitis   . Arthritis     bilateral knees; has planned partial knee replacement next month  . Right nephrolithiasis     Past Surgical History  Procedure Laterality Date  . Cholecystectomy  2002  . Extracorporeal shock wave lithotripsy Right 12-17-2013  . Umbilical hernia repair  2000  . Inguinal hernia repair Bilateral right 2003/   left 2004  . Cystoscopy with retrograde pyelogram, ureteroscopy and stent placement Right 02/12/2014    Procedure: RIGHT URETEROSCOPY ;  Surgeon: Festus Aloe, MD;  Location: [facility];  Service: Urology;  Laterality: Right;  . Cystoscopy w/ ureteral stent placement Right 02/12/2014    Procedure: CYSTOSCOPY WITH RETROGRADE PYELOGRAM/URETERAL STENT PLACEMENT;  Surgeon: Festus Aloe, MD;  Location: [facility];  Service: Urology;  Laterality: Right;  . Cystoscopy with ureteroscopy and stent placement Right 03/05/2014    Procedure: CYSTOSCOPY WITH URETEROSCOPY AND STENT EXCHANGE, STONE EXTRACTION;  Surgeon: Festus Aloe, MD;  Location: [facility];  Service: Urology;  Laterality: Right;  . Holmium laser application Right 0/37/0488    Procedure: HOLMIUM LASER APPLICATION;  Surgeon: Festus Aloe, MD;  Location: [facility];  Service: Urology;  Laterality: Right;  . Cystoscopy with ureteroscopy and stent placement Right 03/26/2014    Procedure: CYSTO WITH RIGHT URETEROSCOPY/STENT PLACEMENT;  Surgeon: Festus Aloe, MD;  Location: [facility];  Service: Urology;  Laterality: Right;  . Stone extraction with basket Right 03/26/2014    Procedure: STONE EXTRACTION WITH BASKET;  Surgeon: Festus Aloe, MD;  Location: [facility];  Service: Urology;  Laterality: Right;  . Total knee arthroplasty Left 02/27/2015    There were no vitals filed for this visit.  Visit Diagnosis:  Knee stiffness, left  Swelling of limb  Knee pain, acute, left  Abnormality of gait  Weakness of left lower extremity      Subjective Assessment - 03/17/15 0956    Subjective Pt reports walking does not cause pain, but bending left knee increases pain to 5/10. Incresed activity level causes increase of swelling and pain.    Currently in Pain? Yes   Pain Score 1    Pain Location Knee   Pain Orientation Left   Pain Onset 1 to 4 weeks ago   Pain Frequency Constant   Multiple Pain Sites No                         [facility] Adult  PT Treatment/Exercise - 03/17/15 0001    Bed Mobility   Bed Mobility --  pt wisheds to be called Lee Smith   Exercises   Exercises Knee/Hip   Knee/Hip Exercises: Stretches   Active Hamstring Stretch 3 reps;20 seconds;Left  on stairs   Knee/Hip Exercises: Aerobic   Stationary Bike Level 0 for ROMx 6 minutes with prolonged stretch   Nustep Level 3x 10 minutes   seat 10, arms 11   Knee/Hip Exercises: Machines for Strengthening   Total Gym Leg Press St#8, 55# B LE 3x10   Knee/Hip Exercises: Standing   Rebounder weightshifting 3 ways x 98min each   Knee/Hip Exercises:  Seated   Long Arc Quad Left;2 sets;10 reps   Knee/Hip Exercises: Supine   Quad Sets Strengthening;Left;3 sets;10 reps   Short Arc [employer] Strengthening;AROM;Left;3 sets;10 reps   Heel Slides AAROM;Left;3 sets;10 reps   Knee/Hip Exercises: Sidelying   Other Sidelying Knee/Hip Exercises PNF Lt LE into  flex-add/ext-abd x 10 challenging due to weakness     Modalities   Modalities Vasopneumatic   Vasopneumatic   Number Minutes Vasopneumatic  15 minutes   Vasopnuematic Location  Knee   Vasopneumatic Pressure Medium   Vasopneumatic Temperature  3 snowflakes                  PT Short Term Goals - 03/17/15 0951    PT SHORT TERM GOAL #1   Title be independent in initial HEP   Time 4   Period Weeks   Status Achieved   PT SHORT TERM GOAL #2   Title demonstrate Lt knee AROM flexion to > or = to 100 degrees to sit with comfort   Time 4   Period Weeks   Status On-going   PT SHORT TERM GOAL #3   Title wean from walker for all distances and demonstrate symmetry with use of cane on level surfaces   Time 4   Period Weeks   Status Achieved   PT SHORT TERM GOAL #4   Title report < or = to 3/10 Lt knee pain with standing and walking   Time 4   Period Weeks   Status On-going           PT Long Term Goals - 03/17/15 4401    PT LONG TERM GOAL #1   Title be independent in advanced HEP   Time 8   Period Weeks   Status On-going   PT LONG TERM GOAL #2   Title reduce FOTO to < or = to 44% limitation   Time 8   Period Weeks   Status On-going   PT LONG TERM GOAL #3   Title demonstrate Lt knee extension to < or = to 3 degrees to normalize gait pattern   Time 8   Period Weeks   Status On-going   PT LONG TERM GOAL #4   Title wean from cane to no device > or = to 50% in the community and demonstrate symmetry with ambulation   Time 8   Period Weeks   Status On-going   PT LONG TERM GOAL #5   Title improve Lt knee strength to 5/5 to allow for community ambulation without  limitation   Time 8   Period Weeks   Status On-going   PT LONG TERM GOAL #6   Title demonstrate Lt knee AROM flexion to 120 degrees to improve squatting and negotiating steps   Time 8   Period Weeks   Status On-going  Plan - 03/17/15 0940    Clinical Impression Statement Pt had TKA exactly two weeks ago. Pt is ambulating without device with mild antalgic gait and decreased ROM left knee. PTA visualy observed decrease in edema and less bruises then last week. Pt will continue to benefit from skilled PT   Pt will benefit from skilled therapeutic intervention in order to improve on the following deficits Abnormal gait;Decreased range of motion;Difficulty walking;Decreased activity tolerance;Pain;Increased edema;Decreased strength;Decreased scar mobility;Impaired flexibility   Rehab Potential Good   PT Frequency 2x / week   PT Duration 8 weeks   PT Treatment/Interventions ADLs/Self Care Home Management;Cryotherapy;Electrical Stimulation;Functional mobility training;Stair training;Gait training;Therapeutic activities;Therapeutic exercise;Neuromuscular re-education;Patient/family education;Passive range of motion;Scar mobilization;Manual techniques;Taping;Vasopneumatic Device   PT Next Visit Plan Continue game ready, Lt knee ROM, strength and gait training   Consulted and Agree with Plan of Care Patient        Problem List Patient Active Problem List   Diagnosis Date Noted  . Adjustment disorder with depressed mood 05/30/2013  . Obesity (BMI 30-39.9) 04/23/2013  . RBBB 10/10/2012  . CONSTIPATION 07/28/2010  . LATERAL EPICONDYLITIS 01/12/2010  . LUMBAGO 06/23/2009  . ACTINIC KERATOSIS 03/19/2009  . CATARACT EXTRACTION STATUS 03/19/2009  . Hyperlipidemia 11/01/2008  . 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 UNSPECIFIED HEARING LOSS 11/01/2008  . Essential hypertension 11/01/2008  . ROTATOR CUFF SYNDROME, 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    NAUMANN-HOUEGNIFIO,Jayvion Stefanski PTA 03/17/2015, 10:15  AM  [facility] [address], [place], [place], [address] Phone: [phone]   Fax:  [phone]

## 2015-03-18 DIAGNOSIS — Z96652 Presence of left artificial knee joint: Secondary | ICD-10-CM | POA: Diagnosis not present

## 2015-03-18 DIAGNOSIS — I10 Essential (primary) hypertension: Secondary | ICD-10-CM | POA: Diagnosis not present

## 2015-03-18 DIAGNOSIS — Z471 Aftercare following joint replacement surgery: Secondary | ICD-10-CM | POA: Diagnosis not present

## 2015-03-18 DIAGNOSIS — Z87891 Personal history of nicotine dependence: Secondary | ICD-10-CM | POA: Diagnosis not present

## 2015-03-19 ENCOUNTER — Ambulatory Visit: Payer: Medicare Other | Admitting: Physical Therapy

## 2015-03-19 DIAGNOSIS — R29898 Other symptoms and signs involving the musculoskeletal system: Secondary | ICD-10-CM | POA: Diagnosis not present

## 2015-03-19 DIAGNOSIS — M7989 Other specified soft tissue disorders: Secondary | ICD-10-CM | POA: Diagnosis not present

## 2015-03-19 DIAGNOSIS — M25662 Stiffness of left knee, not elsewhere classified: Secondary | ICD-10-CM | POA: Diagnosis not present

## 2015-03-19 DIAGNOSIS — R269 Unspecified abnormalities of gait and mobility: Secondary | ICD-10-CM | POA: Diagnosis not present

## 2015-03-19 DIAGNOSIS — M25562 Pain in left knee: Secondary | ICD-10-CM | POA: Diagnosis not present

## 2015-03-19 NOTE — Therapy (Signed)
[facility] [address], [place], [place], [address] Phone: [phone]   Fax:  [phone]  Physical Therapy Treatment  Patient Details  Name: Lee Smith MRN: [id number] Date of Birth: September 19, 1945 Referring Provider:  Karlene Einstein, MD  Encounter Date: 03/19/2015      PT End of Session - 03/19/15 0952    Visit Number 5   Number of Visits 10   Date for PT Re-Evaluation 05/01/15   PT Start Time 0919   PT Stop Time 1028   PT Time Calculation (min) 69 min   Activity Tolerance Patient tolerated treatment well   Behavior During Therapy [facility] for tasks assessed/performed      Past Medical History  Diagnosis Date  . Hypertension   . Hyperlipidemia   . Genital warts   . Hx of colonic polyps     2008  . History of kidney stones 11/2013    lithotripsy for right ureteral stone  . Allergic rhinitis   . Arthritis     bilateral knees; has planned partial knee replacement next month  . Right nephrolithiasis     Past Surgical History  Procedure Laterality Date  . Cholecystectomy  2002  . Extracorporeal shock wave lithotripsy Right 12-17-2013  . Umbilical hernia repair  2000  . Inguinal hernia repair Bilateral right 2003/   left 2004  . Cystoscopy with retrograde pyelogram, ureteroscopy and stent placement Right 02/12/2014    Procedure: RIGHT URETEROSCOPY ;  Surgeon: Festus Aloe, MD;  [facility]  Service: Urology;  Laterality: Right;  . Cystoscopy w/ ureteral stent placement Right 02/12/2014    Procedure: CYSTOSCOPY WITH RETROGRADE PYELOGRAM/URETERAL STENT PLACEMENT;  Surgeon: Festus Aloe, MD;  Location: [facility];  Service: Urology;  Laterality: Right;  . Cystoscopy with ureteroscopy and stent placement Right 03/05/2014    Procedure: CYSTOSCOPY WITH URETEROSCOPY AND STENT EXCHANGE, STONE EXTRACTION;  Surgeon: Festus Aloe, MD;  Location: [facility];  Service: Urology;  Laterality: Right;  . Holmium laser application Right 8/59/2924    Procedure: HOLMIUM LASER APPLICATION;  Surgeon: Festus Aloe, MD;  Location: [facility];  Service: Urology;  Laterality: Right;  . Cystoscopy with ureteroscopy and stent placement Right 03/26/2014    Procedure: CYSTO WITH RIGHT URETEROSCOPY/STENT PLACEMENT;  Surgeon: Festus Aloe, MD;  Location: [facility];  Service: Urology;  Laterality: Right;  . Stone extraction with basket Right 03/26/2014    Procedure: STONE EXTRACTION WITH BASKET;  Surgeon: Festus Aloe, MD;  Location: [facility];  Service: Urology;  Laterality: Right;  . Total knee arthroplasty Left 02/27/2015    There were no vitals filed for this visit.  Visit Diagnosis:  Knee stiffness, left  Swelling of limb  Knee pain, acute, left  Abnormality of gait  Weakness of left lower extremity      Subjective Assessment - 03/19/15 0948    Subjective With prolonged standing or walking increase of swelling and tightness reported in left knee, pain with bending 4/10.    Currently in Pain? Yes   Pain Score 1    Pain Location Knee   Pain Orientation Left   Pain Descriptors / Indicators Aching   Pain Type Surgical pain   Pain Onset 1 to 4 weeks ago   Pain Frequency Constant   Multiple Pain Sites No  [place] Adult PT Treatment/Exercise - 03/19/15 0001    Exercises   Exercises Knee/Hip   Knee/Hip Exercises: Aerobic   Stationary Bike Level 0 for ROMx 6 minutes with prolonged stretch   Nustep Level 3x 10 minutes    Knee/Hip Exercises: Machines for Strengthening   Total Gym Leg Press St#8, 60# B LE 3x10, 30# left LE 3x10  incr. weight next visit   Knee/Hip Exercises: Standing   Lateral Step Up Left;2 sets;10 reps;Hand Hold: 1   Forward Step Up Left;2 sets;10 reps;Hand Hold: 1   Step Down Other (comment)  tapping down with Rt Le for  strengthening Lt LE   Rebounder weightshifting 3 ways x 62min each   Other Standing Knee Exercises --  knee extension pressing into ball 2 x 10   Knee/Hip Exercises: Seated   Long Arc Quad Left;3 sets;10 reps   Modalities   Modalities Vasopneumatic   Vasopneumatic   Number Minutes Vasopneumatic  15 minutes   Vasopnuematic Location  Knee   Vasopneumatic Pressure Medium   Vasopneumatic Temperature  3 snowflakes                  PT Short Term Goals - 03/17/15 0951    PT SHORT TERM GOAL #1   Title be independent in initial HEP   Time 4   Period Weeks   Status Achieved   PT SHORT TERM GOAL #2   Title demonstrate Lt knee AROM flexion to > or = to 100 degrees to sit with comfort   Time 4   Period Weeks   Status On-going   PT SHORT TERM GOAL #3   Title wean from walker for all distances and demonstrate symmetry with use of cane on level surfaces   Time 4   Period Weeks   Status Achieved   PT SHORT TERM GOAL #4   Title report < or = to 3/10 Lt knee pain with standing and walking   Time 4   Period Weeks   Status On-going           PT Long Term Goals - 03/17/15 0454    PT LONG TERM GOAL #1   Title be independent in advanced HEP   Time 8   Period Weeks   Status On-going   PT LONG TERM GOAL #2   Title reduce FOTO to < or = to 44% limitation   Time 8   Period Weeks   Status On-going   PT LONG TERM GOAL #3   Title demonstrate Lt knee extension to < or = to 3 degrees to normalize gait pattern   Time 8   Period Weeks   Status On-going   PT LONG TERM GOAL #4   Title wean from cane to no device > or = to 50% in the community and demonstrate symmetry with ambulation   Time 8   Period Weeks   Status On-going   PT LONG TERM GOAL #5   Title improve Lt knee strength to 5/5 to allow for community ambulation without limitation   Time 8   Period Weeks   Status On-going   PT LONG TERM GOAL #6   Title demonstrate Lt knee AROM flexion to 120 degrees to improve  squatting and negotiating steps   Time 8   Period Weeks   Status On-going               Plan - 03/19/15 0953    Clinical Impression Statement Pt had TKA  two weeks ago. Pt progressed to exercises on stairs and tolerates weightbearing well on left LE. Still mild antalgic gait and decreased ROM. Pt will continue to benefit from skilled PT   Pt will benefit from skilled therapeutic intervention in order to improve on the following deficits Abnormal gait;Decreased range of motion;Difficulty walking;Decreased activity tolerance;Pain;Increased edema;Decreased strength;Decreased scar mobility;Impaired flexibility   Rehab Potential Good   PT Frequency 2x / week   PT Duration 8 weeks   PT Treatment/Interventions ADLs/Self Care Home Management;Cryotherapy;Electrical Stimulation;Functional mobility training;Stair training;Gait training;Therapeutic activities;Therapeutic exercise;Neuromuscular re-education;Patient/family education;Passive range of motion;Scar mobilization;Manual techniques;Taping;Vasopneumatic Device   PT Next Visit Plan Continue game ready, Lt knee ROM, strength and gait training   Consulted and Agree with Plan of Care Patient        Problem List Patient Active Problem List   Diagnosis Date Noted  . Adjustment disorder with depressed mood 05/30/2013  . Obesity (BMI 30-39.9) 04/23/2013  . RBBB 10/10/2012  . CONSTIPATION 07/28/2010  . LATERAL EPICONDYLITIS 01/12/2010  . LUMBAGO 06/23/2009  . ACTINIC KERATOSIS 03/19/2009  . CATARACT EXTRACTION STATUS 03/19/2009  . Hyperlipidemia 11/01/2008  . 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 UNSPECIFIED HEARING LOSS 11/01/2008  . Essential hypertension 11/01/2008  . ROTATOR CUFF SYNDROME, 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    NAUMANN-HOUEGNIFIO,Jesicca Dipierro PTA 03/19/2015, 10:07 AM  [facility] [address] [place], [place], [address] Phone: [phone]   Fax:  [phone]

## 2015-03-20 ENCOUNTER — Ambulatory Visit (INDEPENDENT_AMBULATORY_CARE_PROVIDER_SITE_OTHER): Payer: Medicare Other | Admitting: Family Medicine

## 2015-03-20 VITALS — BP 120/80 | HR 102 | Temp 98.7°F | Wt 223.0 lb

## 2015-03-20 DIAGNOSIS — M109 Gout, unspecified: Secondary | ICD-10-CM

## 2015-03-20 DIAGNOSIS — M10072 Idiopathic gout, left ankle and foot: Secondary | ICD-10-CM | POA: Diagnosis not present

## 2015-03-20 MED ORDER — COLCHICINE 0.6 MG PO TABS: 0.6000 mg | ORAL_TABLET | Freq: Two times a day (BID) | ORAL | Status: DC

## 2015-03-20 NOTE — Progress Notes (Signed)
Pre visit review using our clinic review tool, if applicable. No additional management support is needed unless otherwise documented below in the visit note. 

## 2015-03-20 NOTE — Progress Notes (Signed)
Subjective:    Patient ID: Lee Smith, male    DOB: 1945/11/13, 69 y.o.   MRN: [id number]  HPI Acute visit for left metatarsophalangeal joint swelling redness and pain. He had left total knee replacement just 2 weeks ago and has done amazingly well. He is ambulating with no assistance. No prior history of gout.  With orthopedist yesterday and they suggested this was likely gout. He denies any foot or toe injury. No fevers or chills. He is are taking aspirin 2 daily at recommendation orthopedist. No alcohol use currently. No known family history of gout  Past Medical History  Diagnosis Date  . Hypertension   . Hyperlipidemia   . Genital warts   . Hx of colonic polyps     2008  . History of kidney stones 11/2013    lithotripsy for right ureteral stone  . Allergic rhinitis   . Arthritis     bilateral knees; has planned partial knee replacement next month  . Right nephrolithiasis    Past Surgical History  Procedure Laterality Date  . Cholecystectomy  2002  . Extracorporeal shock wave lithotripsy Right 12-17-2013  . Umbilical hernia repair  2000  . Inguinal hernia repair Bilateral right 2003/   left 2004  . Cystoscopy with retrograde pyelogram, ureteroscopy and stent placement Right 02/12/2014    Procedure: RIGHT URETEROSCOPY ;  Surgeon: Festus Aloe, MD;  Location: [facility];  Service: Urology;  Laterality: Right;  . Cystoscopy w/ ureteral stent placement Right 02/12/2014    Procedure: CYSTOSCOPY WITH RETROGRADE PYELOGRAM/URETERAL STENT PLACEMENT;  Surgeon: Festus Aloe, MD;  Location: [facility];  Service: Urology;  Laterality: Right;  . Cystoscopy with ureteroscopy and stent placement Right 03/05/2014    Procedure: CYSTOSCOPY WITH URETEROSCOPY AND STENT EXCHANGE, STONE EXTRACTION;  Surgeon: Festus Aloe, MD;  Location: [facility];  Service: Urology;  Laterality: Right;  . Holmium laser application Right 6/43/3295   Procedure: HOLMIUM LASER APPLICATION;  Surgeon: Festus Aloe, MD;  Location: [facility];  Service: Urology;  Laterality: Right;  . Cystoscopy with ureteroscopy and stent placement Right 03/26/2014    Procedure: CYSTO WITH RIGHT URETEROSCOPY/STENT PLACEMENT;  Surgeon: Festus Aloe, MD;  Location: [facility];  Service: Urology;  Laterality: Right;  . Stone extraction with basket Right 03/26/2014    Procedure: STONE EXTRACTION WITH BASKET;  Surgeon: Festus Aloe, MD;  Location: [facility];  Service: Urology;  Laterality: Right;  . Total knee arthroplasty Left 02/27/2015    reports that he quit smoking about 44 years ago. His smoking use included Cigarettes. He has a 6 pack-year smoking history. He has quit using smokeless tobacco. His smokeless tobacco use included Chew. He reports that he drinks about 4.2 oz of alcohol per week. He reports that he does not use illicit drugs. family history includes Arthritis in an other family member; Cancer in his father and sister; Hyperlipidemia in an other family member; Hypertension in an other family member. No Known Allergies    Review of Systems  Constitutional: Negative for fever and chills.       Objective:   Physical Exam  Constitutional: He appears well-developed and well-nourished.  Cardiovascular: Normal rate and regular rhythm.   Musculoskeletal:  Left foot reveals very mild erythema warmth and tenderness metatarsophalangeal joint. He has healing wound left anterior knee from recent surgery and expected amount of edema following knee replacement. Only minimal warmth and no signs of secondary infection  Assessment & Plan:  Acute pain left metatarsophalangeal joint. Suspect acute gout. Probably related to relative decreased mobility from recent knee surgery. Would avoid prednisone with recent surgery. He is already taking aspirin. Add colchicine 0.6 mg 2 at onset and then one  every 12 hours. Touch base if this is not resolving over the next few days

## 2015-03-20 NOTE — Patient Instructions (Signed)

## 2015-03-26 ENCOUNTER — Encounter: Payer: Self-pay | Admitting: Physical Therapy

## 2015-03-26 ENCOUNTER — Ambulatory Visit: Payer: Medicare Other | Attending: Orthopaedic Surgery | Admitting: Physical Therapy

## 2015-03-26 DIAGNOSIS — M25562 Pain in left knee: Secondary | ICD-10-CM | POA: Insufficient documentation

## 2015-03-26 DIAGNOSIS — M25662 Stiffness of left knee, not elsewhere classified: Secondary | ICD-10-CM | POA: Diagnosis not present

## 2015-03-26 DIAGNOSIS — M7989 Other specified soft tissue disorders: Secondary | ICD-10-CM | POA: Diagnosis not present

## 2015-03-26 DIAGNOSIS — R269 Unspecified abnormalities of gait and mobility: Secondary | ICD-10-CM | POA: Diagnosis not present

## 2015-03-26 DIAGNOSIS — R29898 Other symptoms and signs involving the musculoskeletal system: Secondary | ICD-10-CM | POA: Diagnosis not present

## 2015-03-26 NOTE — Therapy (Signed)
[facility] [address], [place], [place], [address] Phone: [phone]   Fax:  [phone]  Physical Therapy Treatment  Patient Details  Name: Lee Smith MRN: [id number] Date of Birth: 02-Apr-1946 Referring Provider:  Karlene Einstein, MD  Encounter Date: 03/26/2015      PT End of Session - 03/26/15 0904    Visit Number 6   Number of Visits 10   Date for PT Re-Evaluation 05/01/15   PT Start Time 0842   PT Stop Time 0935   PT Time Calculation (min) 53 min   Activity Tolerance Patient tolerated treatment well   Behavior During Therapy [facility] for tasks assessed/performed      Past Medical History  Diagnosis Date  . Hypertension   . Hyperlipidemia   . Genital warts   . Hx of colonic polyps     2008  . History of kidney stones 11/2013    lithotripsy for right ureteral stone  . Allergic rhinitis   . Arthritis     bilateral knees; has planned partial knee replacement next month  . Right nephrolithiasis     Past Surgical History  Procedure Laterality Date  . Cholecystectomy  2002  . Extracorporeal shock wave lithotripsy Right 12-17-2013  . Umbilical hernia repair  2000  . Inguinal hernia repair Bilateral right 2003/   left 2004  . Cystoscopy with retrograde pyelogram, ureteroscopy and stent placement Right 02/12/2014    Procedure: RIGHT URETEROSCOPY ;  Surgeon: Festus Aloe, MD;  Location: [facility];  Service: Urology;  Laterality: Right;  . Cystoscopy w/ ureteral stent placement Right 02/12/2014    Procedure: CYSTOSCOPY WITH RETROGRADE PYELOGRAM/URETERAL STENT PLACEMENT;  Surgeon: Festus Aloe, MD;  Location: [facility];  Service: Urology;  Laterality: Right;  . Cystoscopy with ureteroscopy and stent placement Right 03/05/2014    Procedure: CYSTOSCOPY WITH URETEROSCOPY AND STENT EXCHANGE, STONE EXTRACTION;  Surgeon: Festus Aloe, MD;  Location: [facility];  Service: Urology;  Laterality: Right;  . Holmium laser application Right 8/54/6270    Procedure: HOLMIUM LASER APPLICATION;  Surgeon: Festus Aloe, MD;  Location: [facility];  Service: Urology;  Laterality: Right;  . Cystoscopy with ureteroscopy and stent placement Right 03/26/2014    Procedure: CYSTO WITH RIGHT URETEROSCOPY/STENT PLACEMENT;  Surgeon: Festus Aloe, MD;  Location: [facility];  Service: Urology;  Laterality: Right;  . Stone extraction with basket Right 03/26/2014    Procedure: STONE EXTRACTION WITH BASKET;  Surgeon: Festus Aloe, MD;  Location: [facility];  Service: Urology;  Laterality: Right;  . Total knee arthroplasty Left 02/27/2015    There were no vitals filed for this visit.  Visit Diagnosis:  Knee stiffness, left  Swelling of limb  Knee pain, acute, left  Weakness of left lower extremity  Abnormality of gait      Subjective Assessment - 03/26/15 0846    Subjective Waled 7000 steps yesterday.    Currently in Pain? No/denies  Aches at night, currently feels tight.   Multiple Pain Sites No            [facility] PT Assessment - 03/26/15 0001    AROM   Left Knee Flexion 110                     [facility] Adult PT Treatment/Exercise - 03/26/15 0001    Knee/Hip Exercises: Stretches   Gastroc Stretch Left;3 reps;20 seconds  Knee/Hip Exercises: Aerobic   Stationary Bike L 0 full fwd ROM   Nustep L3 x 10 min   Knee/Hip Exercises: Machines for Strengthening   Total Gym Leg Press St #8 Bil 70# 30x, LTLE 40# 30x   Knee/Hip Exercises: Standing   Forward Step Up Left;1 set;20 reps;Hand Hold: 0;Step Height: 6"   Step Down Left;1 set;20 reps;Hand Hold: 0;Step Height: 6"   Rebounder weightshifting 3 ways x 61mn each   Walking with Sports Cord 25# 10 fwd/bkwrd   Knee/Hip Exercises: Seated   Long Arc Quad Strengthening;Left;3 sets;10 reps   Long Arc Quad Weight 2 lbs.   Vasopneumatic    Number Minutes Vasopneumatic  15 minutes   Vasopnuematic Location  Knee   Vasopneumatic Pressure Medium   Vasopneumatic Temperature  3 snowflakes                  PT Short Term Goals - 03/26/15 0911    PT SHORT TERM GOAL #2   Title demonstrate Lt knee AROM flexion to > or = to 100 degrees to sit with comfort   Time 4   Period Weeks   Status Achieved  110 flexion           PT Long Term Goals - 03/17/15 00349   PT LONG TERM GOAL #1   Title be independent in advanced HEP   Time 8   Period Weeks   Status On-going   PT LONG TERM GOAL #2   Title reduce FOTO to < or = to 44% limitation   Time 8   Period Weeks   Status On-going   PT LONG TERM GOAL #3   Title demonstrate Lt knee extension to < or = to 3 degrees to normalize gait pattern   Time 8   Period Weeks   Status On-going   PT LONG TERM GOAL #4   Title wean from cane to no device > or = to 50% in the community and demonstrate symmetry with ambulation   Time 8   Period Weeks   Status On-going   PT LONG TERM GOAL #5   Title improve Lt knee strength to 5/5 to allow for community ambulation without limitation   Time 8   Period Weeks   Status On-going   PT LONG TERM GOAL #6   Title demonstrate Lt knee AROM flexion to 120 degrees to improve squatting and negotiating steps   Time 8   Period Weeks   Status On-going               Plan - 03/26/15 0904    Clinical Impression Statement Added weights to LAQ and resisted walking today in addition to increasing weights on machines, all due to increasing strength and decreasing surgical pain.  ROM continues to improve. All STGs met.    Pt will benefit from skilled therapeutic intervention in order to improve on the following deficits Abnormal gait;Decreased range of motion;Difficulty walking;Decreased activity tolerance;Pain;Increased edema;Decreased strength;Decreased scar mobility;Impaired flexibility   Rehab Potential Good   Clinical Impairments Affecting  Rehab Potential None   PT Frequency 2x / week   PT Duration 8 weeks   PT Treatment/Interventions ADLs/Self Care Home Management;Cryotherapy;Electrical Stimulation;Functional mobility training;Stair training;Gait training;Therapeutic activities;Therapeutic exercise;Neuromuscular re-education;Patient/family education;Passive range of motion;Scar mobilization;Manual techniques;Taping;Vasopneumatic Device   PT Next Visit Plan Continue game ready, Lt knee ROM, strength and gait training, work some more on extension ROM since flexion improving faster.   Consulted and Agree with Plan of Care  Patient        Problem List Patient Active Problem List   Diagnosis Date Noted  . Adjustment disorder with depressed mood 05/30/2013  . Obesity (BMI 30-39.9) 04/23/2013  . RBBB 10/10/2012  . CONSTIPATION 07/28/2010  . LATERAL EPICONDYLITIS 01/12/2010  . LUMBAGO 06/23/2009  . ACTINIC KERATOSIS 03/19/2009  . CATARACT EXTRACTION STATUS 03/19/2009  . Hyperlipidemia 11/01/2008  . 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 UNSPECIFIED HEARING LOSS 11/01/2008  . Essential hypertension 11/01/2008  . ROTATOR CUFF SYNDROME, 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    Kasee Hantz, PTA 03/26/2015, 9:16 AM   [facility] [address], [place], [place], [address] Phone: [phone]   Fax:  [phone]    Pt reported at end of tx he plans to add the gastroc stretch to his HEP.

## 2015-03-28 ENCOUNTER — Ambulatory Visit: Payer: Medicare Other | Admitting: Physical Therapy

## 2015-03-31 ENCOUNTER — Ambulatory Visit: Payer: Medicare Other

## 2015-03-31 DIAGNOSIS — R269 Unspecified abnormalities of gait and mobility: Secondary | ICD-10-CM | POA: Diagnosis not present

## 2015-03-31 DIAGNOSIS — R29898 Other symptoms and signs involving the musculoskeletal system: Secondary | ICD-10-CM

## 2015-03-31 DIAGNOSIS — M7989 Other specified soft tissue disorders: Secondary | ICD-10-CM | POA: Diagnosis not present

## 2015-03-31 DIAGNOSIS — M25662 Stiffness of left knee, not elsewhere classified: Secondary | ICD-10-CM | POA: Diagnosis not present

## 2015-03-31 DIAGNOSIS — M25562 Pain in left knee: Secondary | ICD-10-CM | POA: Diagnosis not present

## 2015-03-31 NOTE — Therapy (Signed)
[facility] [address] [place], [place], [address] Phone: [phone]   Fax:  [phone]  Physical Therapy Treatment  Patient Details  Name: Lee Smith MRN: [id number] Date of Birth: [birth year] Referring Provider:  [name], MD  Encounter Date: 03/31/2015      PT End of Session - 03/31/15 1300    Visit Number 7   Number of Visits 10   Date for PT Re-Evaluation 05/01/15   PT Start Time 1220   PT Stop Time 1314   PT Time Calculation (min) 54 min   Activity Tolerance Patient tolerated treatment well   Behavior During Therapy [facility] for tasks assessed/performed      Past Medical History  Diagnosis Date  . Hypertension   . Hyperlipidemia   . Genital warts   . Hx of colonic polyps     2008  . History of kidney stones 11/2013    lithotripsy for right ureteral stone  . Allergic rhinitis   . Arthritis     bilateral knees; has planned partial knee replacement next month  . Right nephrolithiasis     Past Surgical History  Procedure Laterality Date  . Cholecystectomy  2002  . Extracorporeal shock wave lithotripsy Right 12-17-2013  . Umbilical hernia repair  2000  . Inguinal hernia repair Bilateral right 2003/   left 2004  . Cystoscopy with retrograde pyelogram, ureteroscopy and stent placement Right 02/12/2014    Procedure: RIGHT URETEROSCOPY ;  Surgeon: Festus Aloe, MD;  Location: [facility];  Service: Urology;  Laterality: Right;  . Cystoscopy w/ ureteral stent placement Right 02/12/2014    Procedure: CYSTOSCOPY WITH RETROGRADE PYELOGRAM/URETERAL STENT PLACEMENT;  Surgeon: Festus Aloe, MD;  Location: [facility];  Service: Urology;  Laterality: Right;  . Cystoscopy with ureteroscopy and stent placement Right 03/05/2014    Procedure: CYSTOSCOPY WITH URETEROSCOPY AND STENT EXCHANGE, STONE EXTRACTION;  Surgeon: Festus Aloe, MD;  Location: [facility];  Service: Urology;  Laterality: Right;  . Holmium laser application Right 10/07/2246    Procedure: HOLMIUM LASER APPLICATION;  Surgeon: Festus Aloe, MD;  Location: [facility];  Service: Urology;  Laterality: Right;  . Cystoscopy with ureteroscopy and stent placement Right 03/26/2014    Procedure: CYSTO WITH RIGHT URETEROSCOPY/STENT PLACEMENT;  Surgeon: Festus Aloe, MD;  Location: [facility];  Service: Urology;  Laterality: Right;  . Stone extraction with basket Right 03/26/2014    Procedure: STONE EXTRACTION WITH BASKET;  Surgeon: Festus Aloe, MD;  Location: [facility];  Service: Urology;  Laterality: Right;  . Total knee arthroplasty Left 02/27/2015    There were no vitals filed for this visit.  Visit Diagnosis:  Knee stiffness, left  Swelling of limb  Weakness of left lower extremity      Subjective Assessment - 03/31/15 1229    Subjective Doing well.   Currently in Pain? No/denies            [facility] PT Assessment - 03/31/15 0001    Strength   Strength Assessment Site Knee   Right/Left Knee Left   Left Knee Flexion 4+/5   Left Knee Extension 4+/5                     [facility] Adult PT Treatment/Exercise - 03/31/15 0001    Knee/Hip Exercises: Aerobic   Stationary Bike Level 2x 13 min   Nustep L4 x 10 min  Knee/Hip Exercises: Machines for Strengthening   Total Gym Leg Press St #8 Bil 85# 30x, Lt LE 50# 30x   Knee/Hip Exercises: Standing   Lateral Step Up Left;2 sets;10 reps;Hand Hold: 1   Forward Step Up Left;1 set;20 reps;Hand Hold: 0;Step Height: 6"   Step Down Left;1 set;20 reps;Hand Hold: 0;Step Height: 6"   Walking with Sports Cord 25# 10 fwd/bkwrd   Modalities   Modalities Vasopneumatic   Vasopneumatic   Number Minutes Vasopneumatic  15 minutes   Vasopnuematic Location  Knee   Vasopneumatic Pressure Medium   Vasopneumatic Temperature  3 snowflakes                  PT  Short Term Goals - 03/26/15 0911    PT SHORT TERM GOAL #2   Title demonstrate Lt knee AROM flexion to > or = to 100 degrees to sit with comfort   Time 4   Period Weeks   Status Achieved  110 flexion           PT Long Term Goals - 03/31/15 1241    PT LONG TERM GOAL #1   Title be independent in advanced HEP   Time 8   Period Weeks   Status On-going   PT LONG TERM GOAL #2   Title reduce FOTO to < or = to 44% limitation   Time 8   Period Weeks   Status On-going  will test on 10th visit   PT LONG TERM GOAL #4   Title wean from cane to no device > or = to 50% in the community and demonstrate symmetry with ambulation   Status Achieved  no device and demonstrates symmetry   PT LONG TERM GOAL #5   Title improve Lt knee strength to 5/5 to allow for community ambulation without limitation   Time 8   Period Weeks   Status On-going   PT LONG TERM GOAL #6   Title demonstrate Lt knee AROM flexion to 120 degrees to improve squatting and negotiating steps   Time 8   Period Weeks   Status On-going  110 measured last week               Plan - 03/31/15 1247    Clinical Impression Statement Pt with 4+/5 Lt knee strength today. Pt has weaned from cane for all distances and demonstrates symmetry with gait on all surface.  Pt did well with 8" step exercises and demonstrated increased difficulty with eccentric strength with descending. Pt incresaed to level 3 today on NuStep.  Pt will continue to benefit from skilled PT for stength and proprioception progression.     Pt will benefit from skilled therapeutic intervention in order to improve on the following deficits Abnormal gait;Decreased range of motion;Difficulty walking;Decreased activity tolerance;Pain;Increased edema;Decreased strength;Decreased scar mobility;Impaired flexibility   Rehab Potential Good   PT Frequency 2x / week   PT Duration 8 weeks   PT Treatment/Interventions ADLs/Self Care Home Management;Cryotherapy;Electrical  Stimulation;Functional mobility training;Stair training;Gait training;Therapeutic activities;Therapeutic exercise;Neuromuscular re-education;Patient/family education;Passive range of motion;Scar mobilization;Manual techniques;Taping;Vasopneumatic Device   PT Next Visit Plan Lt knee AROM, strength, endurance and propriocepton.  Edema management as needed.     Consulted and Agree with Plan of Care Patient        Problem List Patient Active Problem List   Diagnosis Date Noted  . Adjustment disorder with depressed mood 05/30/2013  . Obesity (BMI 30-39.9) 04/23/2013  . RBBB 10/10/2012  . CONSTIPATION 07/28/2010  . LATERAL  EPICONDYLITIS 01/12/2010  . LUMBAGO 06/23/2009  . ACTINIC KERATOSIS 03/19/2009  . CATARACT EXTRACTION STATUS 03/19/2009  . Hyperlipidemia 11/01/2008  . 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 UNSPECIFIED HEARING LOSS 11/01/2008  . Essential hypertension 11/01/2008  . ROTATOR CUFF SYNDROME, 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    Iven Earnhart , PT  03/31/2015, 1:02 PM   [facility] [address] [place], [place], [address] Phone: [phone]   Fax:  [phone]

## 2015-04-03 ENCOUNTER — Ambulatory Visit: Payer: Medicare Other

## 2015-04-03 DIAGNOSIS — R29898 Other symptoms and signs involving the musculoskeletal system: Secondary | ICD-10-CM | POA: Diagnosis not present

## 2015-04-03 DIAGNOSIS — M25662 Stiffness of left knee, not elsewhere classified: Secondary | ICD-10-CM | POA: Diagnosis not present

## 2015-04-03 DIAGNOSIS — R269 Unspecified abnormalities of gait and mobility: Secondary | ICD-10-CM | POA: Diagnosis not present

## 2015-04-03 DIAGNOSIS — M7989 Other specified soft tissue disorders: Secondary | ICD-10-CM | POA: Diagnosis not present

## 2015-04-03 DIAGNOSIS — M25562 Pain in left knee: Secondary | ICD-10-CM | POA: Diagnosis not present

## 2015-04-03 NOTE — Therapy (Signed)
[facility] [address], [place], [place], [address] Phone: [phone]   Fax:  [phone]  Physical Therapy Treatment  Patient Details  Name: Lee Smith MRN: [id number] Date of Birth: 03-24-1946 Referring Shavonne Ambroise:  Karlene Einstein, MD  Encounter Date: 04/03/2015      PT End of Session - 04/03/15 1000    Visit Number 8   Number of Visits 10   Date for PT Re-Evaluation 05/01/15   PT Start Time 0921   PT Stop Time 1018   PT Time Calculation (min) 57 min   Activity Tolerance Patient tolerated treatment well   Behavior During Therapy [facility] for tasks assessed/performed      Past Medical History  Diagnosis Date  . Hypertension   . Hyperlipidemia   . Genital warts   . Hx of colonic polyps     2008  . History of kidney stones 11/2013    lithotripsy for right ureteral stone  . Allergic rhinitis   . Arthritis     bilateral knees; has planned partial knee replacement next month  . Right nephrolithiasis     Past Surgical History  Procedure Laterality Date  . Cholecystectomy  2002  . Extracorporeal shock wave lithotripsy Right 12-17-2013  . Umbilical hernia repair  2000  . Inguinal hernia repair Bilateral right 2003/   left 2004  . Cystoscopy with retrograde pyelogram, ureteroscopy and stent placement Right 02/12/2014    Procedure: RIGHT URETEROSCOPY ;  Surgeon: Festus Aloe, MD;  Location: [facility];  Service: Urology;  Laterality: Right;  . Cystoscopy w/ ureteral stent placement Right 02/12/2014    Procedure: CYSTOSCOPY WITH RETROGRADE PYELOGRAM/URETERAL STENT PLACEMENT;  Surgeon: Festus Aloe, MD;  Location: [facility];  Service: Urology;  Laterality: Right;  . Cystoscopy with ureteroscopy and stent placement Right 03/05/2014    Procedure: CYSTOSCOPY WITH URETEROSCOPY AND STENT EXCHANGE, STONE EXTRACTION;  Surgeon: Festus Aloe, MD;  Location: [facility];  Service: Urology;  Laterality: Right;  . Holmium laser application Right 02/25/9832    Procedure: HOLMIUM LASER APPLICATION;  Surgeon: Festus Aloe, MD;  Location: [facility];  Service: Urology;  Laterality: Right;  . Cystoscopy with ureteroscopy and stent placement Right 03/26/2014    Procedure: CYSTO WITH RIGHT URETEROSCOPY/STENT PLACEMENT;  Surgeon: Festus Aloe, MD;  Location: [facility];  Service: Urology;  Laterality: Right;  . Stone extraction with basket Right 03/26/2014    Procedure: STONE EXTRACTION WITH BASKET;  Surgeon: Festus Aloe, MD;  Location: [facility];  Service: Urology;  Laterality: Right;  . Total knee arthroplasty Left 02/27/2015    There were no vitals filed for this visit.  Visit Diagnosis:  Knee stiffness, left  Swelling of limb  Weakness of left lower extremity      Subjective Assessment - 04/03/15 0933    Subjective Lt medial knee feels tight   Currently in Pain? No/denies                         [facility] Adult PT Treatment/Exercise - 04/03/15 0001    Knee/Hip Exercises: Aerobic   Stationary Bike Level 2x 13 min   Knee/Hip Exercises: Machines for Strengthening   Total Gym Leg Press St #8 Bil # 30x, Lt LE 55# 30x   Knee/Hip Exercises: Standing   Lateral Step Up Left;2 sets;10 reps;Hand Hold: 1;Step Height: 8"   Forward Step Up Left;1 set;20  reps;Hand Hold: 0;Step Height: 8"   Step Down Left;1 set;20 reps;Hand Hold: 0;Step Height: 8"   Walking with Sports Cord 25# 10 forward and reverse, 20# sidestepping bilaterally x 10   Knee/Hip Exercises: Seated   Long Arc Quad Strengthening;Left;3 sets;10 reps   Long Arc Quad Weight 3 lbs.   Modalities   Modalities Vasopneumatic   Vasopneumatic   Number Minutes Vasopneumatic  15 minutes   Vasopnuematic Location  Knee   Vasopneumatic Pressure Medium   Vasopneumatic Temperature  3 snowflakes                  PT Short  Term Goals - 03/26/15 0911    PT SHORT TERM GOAL #2   Title demonstrate Lt knee AROM flexion to > or = to 100 degrees to sit with comfort   Time 4   Period Weeks   Status Achieved  110 flexion           PT Long Term Goals - 03/31/15 1241    PT LONG TERM GOAL #1   Title be independent in advanced HEP   Time 8   Period Weeks   Status On-going   PT LONG TERM GOAL #2   Title reduce FOTO to < or = to 44% limitation   Time 8   Period Weeks   Status On-going  will test on 10th visit   PT LONG TERM GOAL #4   Title wean from cane to no device > or = to 50% in the community and demonstrate symmetry with ambulation   Status Achieved  no device and demonstrates symmetry   PT LONG TERM GOAL #5   Title improve Lt knee strength to 5/5 to allow for community ambulation without limitation   Time 8   Period Weeks   Status On-going   PT LONG TERM GOAL #6   Title demonstrate Lt knee AROM flexion to 120 degrees to improve squatting and negotiating steps   Time 8   Period Weeks   Status On-going  110 measured last week               Plan - 04/03/15 0936    Clinical Impression Statement Pt continues to tolerate increased challenge in the clinic with exercise.  Pt did well with 8" step down exercise today with improved eccentric control.  Pt able to tolerate increased weight on the leg press today as well.  Pt will continue to benefit from skilled PT for strength and proprioception progression.     Pt will benefit from skilled therapeutic intervention in order to improve on the following deficits Abnormal gait;Decreased range of motion;Difficulty walking;Decreased activity tolerance;Pain;Increased edema;Decreased strength;Decreased scar mobility;Impaired flexibility   Rehab Potential Good   PT Frequency 2x / week   PT Duration 8 weeks   PT Treatment/Interventions ADLs/Self Care Home Management;Cryotherapy;Electrical Stimulation;Functional mobility training;Stair training;Gait  training;Therapeutic activities;Therapeutic exercise;Neuromuscular re-education;Patient/family education;Passive range of motion;Scar mobilization;Manual techniques;Taping;Vasopneumatic Device   PT Next Visit Plan Lt knee AROM, strength, endurance and propriocepton.  Edema management as needed.     Consulted and Agree with Plan of Care Patient        Problem List Patient Active Problem List   Diagnosis Date Noted  . Adjustment disorder with depressed mood 05/30/2013  . Obesity (BMI 30-39.9) 04/23/2013  . RBBB 10/10/2012  . CONSTIPATION 07/28/2010  . LATERAL EPICONDYLITIS 01/12/2010  . LUMBAGO 06/23/2009  . ACTINIC KERATOSIS 03/19/2009  . CATARACT EXTRACTION STATUS 03/19/2009  . Hyperlipidemia 11/01/2008  .  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 UNSPECIFIED HEARING LOSS 11/01/2008  . Essential hypertension 11/01/2008  . ROTATOR CUFF SYNDROME, 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    TAKACS,KELLY, PT 04/03/2015, 10:02 AM  [facility] [address], [place], [place], [address] Phone: [phone]   Fax:  [phone]

## 2015-04-08 ENCOUNTER — Ambulatory Visit: Payer: Medicare Other

## 2015-04-08 DIAGNOSIS — M7989 Other specified soft tissue disorders: Secondary | ICD-10-CM | POA: Diagnosis not present

## 2015-04-08 DIAGNOSIS — R29898 Other symptoms and signs involving the musculoskeletal system: Secondary | ICD-10-CM | POA: Diagnosis not present

## 2015-04-08 DIAGNOSIS — M25662 Stiffness of left knee, not elsewhere classified: Secondary | ICD-10-CM | POA: Diagnosis not present

## 2015-04-08 DIAGNOSIS — R269 Unspecified abnormalities of gait and mobility: Secondary | ICD-10-CM | POA: Diagnosis not present

## 2015-04-08 DIAGNOSIS — M25562 Pain in left knee: Secondary | ICD-10-CM | POA: Diagnosis not present

## 2015-04-08 NOTE — Therapy (Signed)
[facility] [address] [place], [place], [address] Phone: [phone]   Fax:  [phone]  Physical Therapy Treatment  Patient Details  Name: Lee Smith MRN: [id number] Date of Birth: March 30, 1946 Referring Provider:  Karlene Einstein, MD  Encounter Date: 04/08/2015      PT End of Session - 04/08/15 1003    Visit Number 9   Number of Visits 19   Date for PT Re-Evaluation 05/01/15   PT Start Time 0925   PT Stop Time 1020   PT Time Calculation (min) 55 min   Activity Tolerance Patient tolerated treatment well   Behavior During Therapy [facility] for tasks assessed/performed      Past Medical History  Diagnosis Date  . Hypertension   . Hyperlipidemia   . Genital warts   . Hx of colonic polyps     2008  . History of kidney stones 11/2013    lithotripsy for right ureteral stone  . Allergic rhinitis   . Arthritis     bilateral knees; has planned partial knee replacement next month  . Right nephrolithiasis     Past Surgical History  Procedure Laterality Date  . Cholecystectomy  2002  . Extracorporeal shock wave lithotripsy Right 12-17-2013  . Umbilical hernia repair  2000  . Inguinal hernia repair Bilateral right 2003/   left 2004  . Cystoscopy with retrograde pyelogram, ureteroscopy and stent placement Right 02/12/2014    Procedure: RIGHT URETEROSCOPY ;  Surgeon: Festus Aloe, MD;  Location: [facility];  Service: Urology;  Laterality: Right;  . Cystoscopy w/ ureteral stent placement Right 02/12/2014    Procedure: CYSTOSCOPY WITH RETROGRADE PYELOGRAM/URETERAL STENT PLACEMENT;  Surgeon: Festus Aloe, MD;  Location: [facility];  Service: Urology;  Laterality: Right;  . Cystoscopy with ureteroscopy and stent placement Right 03/05/2014    Procedure: CYSTOSCOPY WITH URETEROSCOPY AND STENT EXCHANGE, STONE EXTRACTION;  Surgeon: Festus Aloe, MD;  Location: [facility];  Service: Urology;  Laterality: Right;  . Holmium laser application Right 02/10/3663    Procedure: HOLMIUM LASER APPLICATION;  Surgeon: Festus Aloe, MD;  Location: [facility];  Service: Urology;  Laterality: Right;  . Cystoscopy with ureteroscopy and stent placement Right 03/26/2014    Procedure: CYSTO WITH RIGHT URETEROSCOPY/STENT PLACEMENT;  Surgeon: Festus Aloe, MD;  Location: [facility];  Service: Urology;  Laterality: Right;  . Stone extraction with basket Right 03/26/2014    Procedure: STONE EXTRACTION WITH BASKET;  Surgeon: Festus Aloe, MD;  Location: [facility];  Service: Urology;  Laterality: Right;  . Total knee arthroplasty Left 02/27/2015    There were no vitals filed for this visit.  Visit Diagnosis:  Knee stiffness, left  Swelling of limb  Weakness of left lower extremity      Subjective Assessment - 04/08/15 0941    Subjective Feeling good.  I get a little bit of swelling when i do too much.   Currently in Pain? Yes   Pain Score 2    Pain Location Knee   Pain Orientation Left   Pain Descriptors / Indicators Tightness   Pain Type Surgical pain   Pain Onset 1 to 4 weeks ago   Aggravating Factors  doing too much, standing and walking   Pain Relieving Factors pain medication, ice, elevation            [facility] PT Assessment - 04/08/15 0001    Assessment  Medical Diagnosis s/p Lt TKA   Onset Date/Surgical Date 03/03/15   Prior Function   Level of Independence Independent   Vocation Retired   Observation/Other Assessments   Focus on Therapeutic Outcomes (FOTO)  38% limitation   AROM   AROM Assessment Site Knee   Right/Left Knee Left   Left Knee Extension 9   Left Knee Flexion 111   Strength   Strength Assessment Site Knee   Right/Left Knee Left   Left Knee Flexion 4+/5   Left Knee Extension 5/5                     [facility] Adult PT Treatment/Exercise - 04/08/15 0001     Knee/Hip Exercises: Aerobic   Stationary Bike Level 3x 10 min   Knee/Hip Exercises: Machines for Strengthening   Total Gym Leg Press St #8 Bil 85 # 30x, Lt LE 55# 30x   Knee/Hip Exercises: Standing   Lateral Step Up Left;2 sets;10 reps;Hand Hold: 1;Step Height: 8"   Forward Step Up Left;1 set;20 reps;Hand Hold: 0;Step Height: 8"   Step Down Left;1 set;20 reps;Hand Hold: 0;Step Height: 8"   Walking with Sports Cord 25# 10 forward and reverse, 20# sidestepping bilaterally x 10   Modalities   Modalities Vasopneumatic   Vasopneumatic   Number Minutes Vasopneumatic  15 minutes   Vasopnuematic Location  Knee   Vasopneumatic Pressure Medium   Vasopneumatic Temperature  3 snowflakes                  PT Short Term Goals - 03/26/15 0911    PT SHORT TERM GOAL #2   Title demonstrate Lt knee AROM flexion to > or = to 100 degrees to sit with comfort   Time 4   Period Weeks   Status Achieved  110 flexion           PT Long Term Goals - 04/08/15 0943    PT LONG TERM GOAL #1   Title be independent in advanced HEP   Time 8   Period Weeks   Status On-going   PT LONG TERM GOAL #2   Title reduce FOTO to < or = to 44% limitation   Status Achieved   PT LONG TERM GOAL #3   Title demonstrate Lt knee extension to < or = to 3 degrees to normalize gait pattern   Time 8   Period Weeks   Status On-going   PT LONG TERM GOAL #4   Title wean from cane to no device > or = to 50% in the community and demonstrate symmetry with ambulation   Status Achieved   PT LONG TERM GOAL #5   Title improve Lt knee strength to 5/5 to allow for community ambulation without limitation   Time 8   Period Weeks   Status On-going   PT LONG TERM GOAL #6   Title demonstrate Lt knee AROM flexion to 120 degrees to improve squatting and negotiating steps   Time 8   Period Weeks   Status On-going               Plan - 04/08/15 1007    Clinical Impression Statement Pt continues to tolerate increased  challenge in the clinic with exercise.  Pt with improved control of Lt quad with eccentric activity.  Pt with Lt knee AROM 9-111 degrees with edema present.  Quad strength is 4+/5 and hamstring 5/5.  FOTO score is 38% limitaion.  Pt will continue to  benefit from skilled PT for edema management and high level strength and proprioception advancement.     Pt will benefit from skilled therapeutic intervention in order to improve on the following deficits Abnormal gait;Decreased range of motion;Difficulty walking;Decreased activity tolerance;Pain;Increased edema;Decreased strength;Decreased scar mobility;Impaired flexibility   Rehab Potential Good   PT Frequency 2x / week   PT Duration 8 weeks   PT Treatment/Interventions ADLs/Self Care Home Management;Cryotherapy;Electrical Stimulation;Functional mobility training;Stair training;Gait training;Therapeutic activities;Therapeutic exercise;Neuromuscular re-education;Patient/family education;Passive range of motion;Scar mobilization;Manual techniques;Taping;Vasopneumatic Device   PT Next Visit Plan Lt knee AROM, strength, endurance and propriocepton.  Edema management as needed.  Resume NuStep, try elliptical   Consulted and Agree with Plan of Care Patient          G-Codes - April 15, 2015 1006    Functional Assessment Tool Used FOTO: 38% limitation   Functional Limitation Mobility: Walking and moving around   Mobility: Walking and Moving Around Current Status [phone]) At least 20 percent but less than 40 percent impaired, limited or restricted   Mobility: Walking and Moving Around Goal Status [phone]) At least 20 percent but less than 40 percent impaired, limited or restricted      Problem List Patient Active Problem List   Diagnosis Date Noted  . Adjustment disorder with depressed mood 05/30/2013  . Obesity (BMI 30-39.9) 04/23/2013  . RBBB 10/10/2012  . CONSTIPATION 07/28/2010  . LATERAL EPICONDYLITIS 01/12/2010  . LUMBAGO 06/23/2009  . ACTINIC  KERATOSIS 03/19/2009  . CATARACT EXTRACTION STATUS 03/19/2009  . Hyperlipidemia 11/01/2008  . 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 UNSPECIFIED HEARING LOSS 11/01/2008  . Essential hypertension 11/01/2008  . ROTATOR CUFF SYNDROME, 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  Physical Therapy Progress Note  Dates of Reporting Period: 03/06/15 to 04/15/2015  Objective Reports of Subjective Statement: Pt reports 75% overall improvement. Objective Measurements: See above for measures  Goal Update: See above Plan: Continue to advance high level strength, proprioception and edema management.  Reason Skilled Services are Required: High level strength progression and edema management to improve function and return to prior level of function.     TAKACS,KELLY, PT Apr 15, 2015, 10:11 AM  [facility] [address] [place], [place], [address] Phone: [phone]   Fax:  [phone]

## 2015-04-11 ENCOUNTER — Encounter: Payer: Medicare Other | Admitting: Physical Therapy

## 2015-04-15 ENCOUNTER — Ambulatory Visit: Payer: Medicare Other

## 2015-04-15 DIAGNOSIS — M25662 Stiffness of left knee, not elsewhere classified: Secondary | ICD-10-CM

## 2015-04-15 DIAGNOSIS — R29898 Other symptoms and signs involving the musculoskeletal system: Secondary | ICD-10-CM | POA: Diagnosis not present

## 2015-04-15 DIAGNOSIS — M7989 Other specified soft tissue disorders: Secondary | ICD-10-CM | POA: Diagnosis not present

## 2015-04-15 DIAGNOSIS — R269 Unspecified abnormalities of gait and mobility: Secondary | ICD-10-CM | POA: Diagnosis not present

## 2015-04-15 DIAGNOSIS — M25562 Pain in left knee: Secondary | ICD-10-CM | POA: Diagnosis not present

## 2015-04-15 NOTE — Therapy (Signed)
[facility] [address] [place], [place], [address] Phone: [phone]   Fax:  [phone]  Physical Therapy Treatment  Patient Details  Name: Lee Smith MRN: [id number] Date of Birth: January 04, 1946 Referring Provider:  Karlene Einstein, MD  Encounter Date: 04/15/2015      PT End of Session - 04/15/15 0954    Visit Number 10   Number of Visits 19   Date for PT Re-Evaluation 05/01/15   PT Start Time 0920   PT Stop Time 1017   PT Time Calculation (min) 57 min   Activity Tolerance Patient tolerated treatment well   Behavior During Therapy [facility] for tasks assessed/performed      Past Medical History  Diagnosis Date  . Hypertension   . Hyperlipidemia   . Genital warts   . Hx of colonic polyps     2008  . History of kidney stones 11/2013    lithotripsy for right ureteral stone  . Allergic rhinitis   . Arthritis     bilateral knees; has planned partial knee replacement next month  . Right nephrolithiasis     Past Surgical History  Procedure Laterality Date  . Cholecystectomy  2002  . Extracorporeal shock wave lithotripsy Right 12-17-2013  . Umbilical hernia repair  2000  . Inguinal hernia repair Bilateral right 2003/   left 2004  . Cystoscopy with retrograde pyelogram, ureteroscopy and stent placement Right 02/12/2014    Procedure: RIGHT URETEROSCOPY ;  Surgeon: Festus Aloe, MD;  Location: [facility];  Service: Urology;  Laterality: Right;  . Cystoscopy w/ ureteral stent placement Right 02/12/2014    Procedure: CYSTOSCOPY WITH RETROGRADE PYELOGRAM/URETERAL STENT PLACEMENT;  Surgeon: Festus Aloe, MD;  Location: [facility];  Service: Urology;  Laterality: Right;  . Cystoscopy with ureteroscopy and stent placement Right 03/05/2014    Procedure: CYSTOSCOPY WITH URETEROSCOPY AND STENT EXCHANGE, STONE EXTRACTION;  Surgeon: Festus Aloe, MD;  Location: [facility];  Service: Urology;  Laterality: Right;  . Holmium laser application Right 3/81/0175    Procedure: HOLMIUM LASER APPLICATION;  Surgeon: Festus Aloe, MD;  Location: [facility];  Service: Urology;  Laterality: Right;  . Cystoscopy with ureteroscopy and stent placement Right 03/26/2014    Procedure: CYSTO WITH RIGHT URETEROSCOPY/STENT PLACEMENT;  Surgeon: Festus Aloe, MD;  Location: [facility];  Service: Urology;  Laterality: Right;  . Stone extraction with basket Right 03/26/2014    Procedure: STONE EXTRACTION WITH BASKET;  Surgeon: Festus Aloe, MD;  Location: [facility];  Service: Urology;  Laterality: Right;  . Total knee arthroplasty Left 02/27/2015    There were no vitals filed for this visit.  Visit Diagnosis:  Knee stiffness, left  Swelling of limb  Weakness of left lower extremity      Subjective Assessment - 04/15/15 0934    Subjective Pt with medial Lt knee pain after riding the bike.     Currently in Pain? Yes   Pain Score 2    Pain Location Knee   Pain Orientation Left   Pain Descriptors / Indicators Tightness   Pain Type Surgical pain   Pain Onset 1 to 4 weeks ago   Pain Frequency Constant   Aggravating Factors  doing too much, early morning   Pain Relieving Factors ice, elevation  [facility] Adult PT Treatment/Exercise - 04/15/15 0001    Knee/Hip Exercises: Aerobic   Stationary Bike Level 4x 12 min   Nustep L4 x 10 min  seat 11   Knee/Hip Exercises: Machines for Strengthening   Total Gym Leg Press St #8 Bil 90# 30x, Lt LE 60# 30x   Knee/Hip Exercises: Standing   Lateral Step Up Left;2 sets;10 reps;Hand Hold: 1;Step Height: 8"   Forward Step Up Left;1 set;20 reps;Hand Hold: 0;Step Height: 8"   Step Down Left;1 set;20 reps;Hand Hold: 0;Step Height: 8"   Walking with Sports Cord 25# 10 forward and reverse, 20# sidestepping bilaterally x 10   Knee/Hip Exercises:  Seated   Long Arc Quad Strengthening;Left;3 sets;10 reps   Long Arc Quad Weight 4 lbs.   Modalities   Modalities Vasopneumatic   Vasopneumatic   Number Minutes Vasopneumatic  15 minutes   Vasopnuematic Location  Knee   Vasopneumatic Pressure Medium   Vasopneumatic Temperature  3 snowflakes                  PT Short Term Goals - 03/26/15 0911    PT SHORT TERM GOAL #2   Title demonstrate Lt knee AROM flexion to > or = to 100 degrees to sit with comfort   Time 4   Period Weeks   Status Achieved  110 flexion           PT Long Term Goals - 04/15/15 0936    PT LONG TERM GOAL #1   Title be independent in advanced HEP   Time 8   Period Weeks   Status On-going   PT LONG TERM GOAL #2   Title reduce FOTO to < or = to 44% limitation   Status Achieved   PT LONG TERM GOAL #3   Title demonstrate Lt knee extension to < or = to 3 degrees to normalize gait pattern   Time 8   Period Weeks   Status On-going   PT LONG TERM GOAL #5   Title improve Lt knee strength to 5/5 to allow for community ambulation without limitation   Time 8   Period Weeks   Status On-going   PT LONG TERM GOAL #6   Title demonstrate Lt knee AROM flexion to 120 degrees to improve squatting and negotiating steps   Time 8   Period Weeks   Status On-going               Plan - 04/15/15 0937    Clinical Impression Statement Pt continues to tolerate increased challence in the clinic with exercise.  Pt with increased weight on the leg press today.  Pt with Lt knee 9-111 degrees and mild edema present.  Quad strength was 4+/5 last sesison and FOTO score is 38% limitation.  Pt will continue to benefit from skilled PT for edema management and high level strength and proprioception advancement.     Pt will benefit from skilled therapeutic intervention in order to improve on the following deficits Abnormal gait;Decreased range of motion;Difficulty walking;Decreased activity tolerance;Pain;Increased  edema;Decreased strength;Decreased scar mobility;Impaired flexibility   Rehab Potential Good   PT Frequency 2x / week   PT Duration 8 weeks   PT Treatment/Interventions ADLs/Self Care Home Management;Cryotherapy;Electrical Stimulation;Functional mobility training;Stair training;Gait training;Therapeutic activities;Therapeutic exercise;Neuromuscular re-education;Patient/family education;Passive range of motion;Scar mobilization;Manual techniques;Taping;Vasopneumatic Device   PT Next Visit Plan Lt knee AROM, strength, endurance and propriocepton.  Edema management as needed.    Consulted and Agree with Plan of Care Patient  Problem List Patient Active Problem List   Diagnosis Date Noted  . Adjustment disorder with depressed mood 05/30/2013  . Obesity (BMI 30-39.9) 04/23/2013  . RBBB 10/10/2012  . CONSTIPATION 07/28/2010  . LATERAL EPICONDYLITIS 01/12/2010  . LUMBAGO 06/23/2009  . ACTINIC KERATOSIS 03/19/2009  . CATARACT EXTRACTION STATUS 03/19/2009  . Hyperlipidemia 11/01/2008  . 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 UNSPECIFIED HEARING LOSS 11/01/2008  . Essential hypertension 11/01/2008  . ROTATOR CUFF SYNDROME, 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    Zacory Fiola, PT 04/15/2015, 9:56 AM  [facility] [address], [place], [place], [address] Phone: [phone]   Fax:  [phone]

## 2015-04-16 DIAGNOSIS — Z471 Aftercare following joint replacement surgery: Secondary | ICD-10-CM | POA: Diagnosis not present

## 2015-04-16 DIAGNOSIS — Z96652 Presence of left artificial knee joint: Secondary | ICD-10-CM | POA: Diagnosis not present

## 2015-04-16 DIAGNOSIS — Z87891 Personal history of nicotine dependence: Secondary | ICD-10-CM | POA: Diagnosis not present

## 2015-04-22 ENCOUNTER — Ambulatory Visit: Payer: Medicare Other | Attending: Orthopaedic Surgery

## 2015-04-22 DIAGNOSIS — R29898 Other symptoms and signs involving the musculoskeletal system: Secondary | ICD-10-CM

## 2015-04-22 DIAGNOSIS — M7989 Other specified soft tissue disorders: Secondary | ICD-10-CM | POA: Insufficient documentation

## 2015-04-22 DIAGNOSIS — M25662 Stiffness of left knee, not elsewhere classified: Secondary | ICD-10-CM | POA: Insufficient documentation

## 2015-04-22 NOTE — Therapy (Signed)
[facility] [address] [place], [place], [address] Phone: [phone]   Fax:  [phone]  Physical Therapy Treatment  Patient Details  Name: Lee Smith MRN: [id number] Date of Birth: March 15, 1946 Referring Provider:  Karlene Einstein, MD  Encounter Date: 04/22/2015      PT End of Session - 04/22/15 0954    Visit Number 11   PT Start Time 0923   PT Stop Time 1020   PT Time Calculation (min) 57 min   Activity Tolerance Patient tolerated treatment well   Behavior During Therapy [facility] for tasks assessed/performed      Past Medical History  Diagnosis Date  . Hypertension   . Hyperlipidemia   . Genital warts   . Hx of colonic polyps     2008  . History of kidney stones 11/2013    lithotripsy for right ureteral stone  . Allergic rhinitis   . Arthritis     bilateral knees; has planned partial knee replacement next month  . Right nephrolithiasis     Past Surgical History  Procedure Laterality Date  . Cholecystectomy  2002  . Extracorporeal shock wave lithotripsy Right 12-17-2013  . Umbilical hernia repair  2000  . Inguinal hernia repair Bilateral right 2003/   left 2004  . Cystoscopy with retrograde pyelogram, ureteroscopy and stent placement Right 02/12/2014    Procedure: RIGHT URETEROSCOPY ;  Surgeon: Festus Aloe, MD;  Location: [facility];  Service: Urology;  Laterality: Right;  . Cystoscopy w/ ureteral stent placement Right 02/12/2014    Procedure: CYSTOSCOPY WITH RETROGRADE PYELOGRAM/URETERAL STENT PLACEMENT;  Surgeon: Festus Aloe, MD;  Location: [facility];  Service: Urology;  Laterality: Right;  . Cystoscopy with ureteroscopy and stent placement Right 03/05/2014    Procedure: CYSTOSCOPY WITH URETEROSCOPY AND STENT EXCHANGE, STONE EXTRACTION;  Surgeon: Festus Aloe, MD;  Location: [facility];  Service: Urology;  Laterality: Right;  . Holmium laser  application Right 12/05/8020    Procedure: HOLMIUM LASER APPLICATION;  Surgeon: Festus Aloe, MD;  Location: [facility];  Service: Urology;  Laterality: Right;  . Cystoscopy with ureteroscopy and stent placement Right 03/26/2014    Procedure: CYSTO WITH RIGHT URETEROSCOPY/STENT PLACEMENT;  Surgeon: Festus Aloe, MD;  Location: [facility];  Service: Urology;  Laterality: Right;  . Stone extraction with basket Right 03/26/2014    Procedure: STONE EXTRACTION WITH BASKET;  Surgeon: Festus Aloe, MD;  Location: [facility];  Service: Urology;  Laterality: Right;  . Total knee arthroplasty Left 02/27/2015    There were no vitals filed for this visit.  Visit Diagnosis:  Knee stiffness, left  Swelling of limb  Weakness of left lower extremity      Subjective Assessment - 04/22/15 0925    Subjective Pt is ready for D/C.  Saw MD and he cleared PT for discharge.     Currently in Pain? No/denies            [facility] PT Assessment - 04/22/15 0001    Assessment   Medical Diagnosis s/p Lt TKA   Onset Date/Surgical Date 03/03/15   Prior Function   Level of Independence Independent   Vocation Retired   Observation/Other Assessments   Focus on Therapeutic Outcomes (FOTO)  38% limitation   AROM   AROM Assessment Site Knee   Left Knee Extension 9   Left Knee Flexion 111   Strength   Strength Assessment Site Knee  Right/Left Knee Left   Left Knee Flexion 5/5   Left Knee Extension 5/5                     [facility] Adult PT Treatment/Exercise - 27-Apr-2015 0001    Knee/Hip Exercises: Aerobic   Stationary Bike Level 4x 12 min   Nustep L6 x 10 min  seat 11   Knee/Hip Exercises: Machines for Strengthening   Total Gym Leg Press St #8 Bil 95# 30x, Lt LE 60# 30x   Knee/Hip Exercises: Standing   Lateral Step Up Left;2 sets;10 reps;Hand Hold: 1;Step Height: 8"   Forward Step Up Left;1 set;20 reps;Hand Hold: 0;Step Height: 8"   Step  Down Left;1 set;20 reps;Hand Hold: 0;Step Height: 8"   Walking with Sports Cord 25# 10 forward and reverse, 25# sidestepping bilaterally x 10   Knee/Hip Exercises: Seated   Long Arc Quad Strengthening;Left;3 sets;10 reps   Long Arc Quad Weight 4 lbs.   Modalities   Modalities Vasopneumatic   Vasopneumatic   Number Minutes Vasopneumatic  15 minutes   Vasopnuematic Location  Knee   Vasopneumatic Pressure Medium   Vasopneumatic Temperature  3 snowflakes                  PT Short Term Goals - 03/26/15 0911    PT SHORT TERM GOAL #2   Title demonstrate Lt knee AROM flexion to > or = to 100 degrees to sit with comfort   Time 4   Period Weeks   Status Achieved  110 flexion           PT Long Term Goals - 2015-04-27 8325    PT LONG TERM GOAL #1   Title be independent in advanced HEP   Status Achieved   PT LONG TERM GOAL #2   Title reduce FOTO to < or = to 44% limitation   Status Achieved  38% limitation   PT LONG TERM GOAL #3   Status Partially Met  lacking 9 degrees   PT LONG TERM GOAL #4   Title wean from cane to no device > or = to 50% in the community and demonstrate symmetry with ambulation   Status Achieved   PT LONG TERM GOAL #5   Title improve Lt knee strength to 5/5 to allow for community ambulation without limitation   Status Achieved   PT LONG TERM GOAL #6   Title demonstrate Lt knee AROM flexion to 120 degrees to improve squatting and negotiating steps   Status On-going  111               Plan - 04/27/2015 0939    Clinical Impression Statement Pt denies any sigificant funtional limitaiton at this time.  Pt reports mild difficulty with descending steps and with bending knee due to stiffness.  Pt is able to mow the grass and walk ~10,000 steps a day without difficulty.  Pt with continued Lt knee edema and limited AROM due to edema and will continue with edema management and HEP for continued gains.     Pt will benefit from skilled therapeutic  intervention in order to improve on the following deficits Abnormal gait;Decreased range of motion;Difficulty walking;Decreased activity tolerance;Pain;Increased edema;Decreased strength;Decreased scar mobility;Impaired flexibility   PT Next Visit Plan D/C PT to HEP   Consulted and Agree with Plan of Care Patient          G-Codes - 04/27/2015 0925    Functional Assessment Tool Used FOTO: 38%  limitation   Functional Limitation Mobility: Walking and moving around   Mobility: Walking and Moving Around Goal Status [phone]) At least 20 percent but less than 40 percent impaired, limited or restricted   Mobility: Walking and Moving Around Discharge Status [id number]) At least 20 percent but less than 40 percent impaired, limited or restricted      Problem List Patient Active Problem List   Diagnosis Date Noted  . Adjustment disorder with depressed mood 05/30/2013  . Obesity (BMI 30-39.9) 04/23/2013  . RBBB 10/10/2012  . CONSTIPATION 07/28/2010  . LATERAL EPICONDYLITIS 01/12/2010  . LUMBAGO 06/23/2009  . ACTINIC KERATOSIS 03/19/2009  . CATARACT EXTRACTION STATUS 03/19/2009  . Hyperlipidemia 11/01/2008  . 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 UNSPECIFIED HEARING LOSS 11/01/2008  . Essential hypertension 11/01/2008  . ROTATOR CUFF SYNDROME, 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  PHYSICAL THERAPY DISCHARGE SUMMARY  Visits from Start of Care: 11  Current functional level related to goals / functional outcomes: See above.   Remaining deficits: Mild difficulty with descending steps.  Pt with stiffness, edema and limited AROM of the Lt knee s/p surgery.  Pt has HEP with emphasis on eccentric strength and ROM gains.     Education / Equipment: HEP Plan: Patient agrees to discharge.  Patient goals were partially met. Patient is being discharged due to being pleased with the current functional level.  ?????   Sigurd Sos, PT 04/22/2015 9:58 AM   Danajah Birdsell, PT 04/22/2015, 9:56 AM  [facility] [address] [place], [place], [address] Phone: [phone]   Fax:  [phone]

## 2015-04-25 ENCOUNTER — Encounter: Payer: Self-pay | Admitting: Physical Therapy

## 2015-04-29 ENCOUNTER — Encounter: Payer: Self-pay | Admitting: Physical Therapy

## 2015-05-01 ENCOUNTER — Ambulatory Visit: Payer: Self-pay

## 2015-06-16 DIAGNOSIS — Z23 Encounter for immunization: Secondary | ICD-10-CM | POA: Diagnosis not present

## 2015-06-18 DIAGNOSIS — M47896 Other spondylosis, lumbar region: Secondary | ICD-10-CM | POA: Diagnosis not present

## 2015-06-18 DIAGNOSIS — Z471 Aftercare following joint replacement surgery: Secondary | ICD-10-CM | POA: Diagnosis not present

## 2015-06-18 DIAGNOSIS — Z96652 Presence of left artificial knee joint: Secondary | ICD-10-CM | POA: Diagnosis not present

## 2015-07-02 ENCOUNTER — Ambulatory Visit (INDEPENDENT_AMBULATORY_CARE_PROVIDER_SITE_OTHER): Payer: Medicare Other | Admitting: Family Medicine

## 2015-07-02 ENCOUNTER — Encounter: Payer: Self-pay | Admitting: Family Medicine

## 2015-07-02 VITALS — BP 140/90 | HR 72 | Temp 98.3°F | Resp 16 | Ht 71.0 in | Wt 230.7 lb

## 2015-07-02 DIAGNOSIS — I10 Essential (primary) hypertension: Secondary | ICD-10-CM

## 2015-07-02 DIAGNOSIS — Z0000 Encounter for general adult medical examination without abnormal findings: Secondary | ICD-10-CM

## 2015-07-02 DIAGNOSIS — R972 Elevated prostate specific antigen [PSA]: Secondary | ICD-10-CM | POA: Diagnosis not present

## 2015-07-02 DIAGNOSIS — E785 Hyperlipidemia, unspecified: Secondary | ICD-10-CM

## 2015-07-02 DIAGNOSIS — L570 Actinic keratosis: Secondary | ICD-10-CM

## 2015-07-02 LAB — BASIC METABOLIC PANEL
BUN: 18 mg/dL (ref 6–23)
CALCIUM: 9.3 mg/dL (ref 8.4–10.5)
CO2: 27 meq/L (ref 19–32)
CREATININE: 0.86 mg/dL (ref 0.40–1.50)
Chloride: 104 mEq/L (ref 96–112)
GFR: 93.34 mL/min (ref >60.00)
Glucose, Bld: 87 mg/dL (ref 70–99)
Potassium: 4.2 mEq/L (ref 3.5–5.1)
Sodium: 139 mEq/L (ref 135–145)

## 2015-07-02 LAB — HEPATIC FUNCTION PANEL
ALK PHOS: 79 U/L (ref 39–117)
ALT: 28 U/L (ref 0–53)
AST: 21 U/L (ref 0–37)
Albumin: 4.2 g/dL (ref 3.5–5.2)
BILIRUBIN DIRECT: 0.1 mg/dL (ref 0.0–0.3)
BILIRUBIN TOTAL: 0.6 mg/dL (ref 0.2–1.2)
TOTAL PROTEIN: 6.7 g/dL (ref 6.0–8.3)

## 2015-07-02 LAB — LIPID PANEL
CHOL/HDL RATIO: 4
CHOLESTEROL: 232 mg/dL — AB (ref 0–200)
HDL: 53 mg/dL (ref >39.00)
LDL Cholesterol: 160 mg/dL — ABNORMAL HIGH (ref 0–99)
NonHDL: 179.36
TRIGLYCERIDES: 96 mg/dL (ref 0.0–149.0)
VLDL: 19.2 mg/dL (ref 0.0–40.0)

## 2015-07-02 LAB — PSA: PSA: 2.26 ng/mL (ref 0.10–4.00)

## 2015-07-02 NOTE — Patient Instructions (Addendum)
Monitor blood pressure and be in touch if consistently > 150/90 Health Maintenance  Topic Date Due  . Hepatitis C Screening  [birth year]  . INFLUENZA VACCINE  02/17/2016  . TETANUS/TDAP  07/19/2017  . COLONOSCOPY  12/26/2021  . ZOSTAVAX  Completed  . PNA vac Low Risk Adult  Completed

## 2015-07-02 NOTE — Progress Notes (Signed)
Subjective:    Patient ID: Lee Smith, male    DOB: 26-Feb-1946, 69 y.o.   MRN: [id number]  HPI Patient here for Medicare wellness exam and medical follow-up.  Hypertension treated with amlodipine and losartan. Blood pressures been well controlled by home readings. No headaches. No recent dizziness He had left total knee replacement about 4 months ago and less exercise until recently. He has gained some weight which he attributes to decline in exercise Nonsmoker. No regular alcohol use.  History of hyperlipidemia. Previously took statin. No history of CAD. Denies prior side effects with statin use.  History of actinic keratoses. These have been treated with liquid nitrogen previously. He has scaly lesion right forehead present for several months. No bleeding. Use some protection and hats when out  Past Medical History  Diagnosis Date  . Hypertension   . Hyperlipidemia   . Genital warts   . Hx of colonic polyps     2008  . History of kidney stones 11/2013    lithotripsy for right ureteral stone  . Allergic rhinitis   . Arthritis     bilateral knees; has planned partial knee replacement next month  . Right nephrolithiasis    Past Surgical History  Procedure Laterality Date  . Cholecystectomy  2002  . Extracorporeal shock wave lithotripsy Right 12-17-2013  . Umbilical hernia repair  2000  . Inguinal hernia repair Bilateral right 2003/   left 2004  . Cystoscopy with retrograde pyelogram, ureteroscopy and stent placement Right 02/12/2014    Procedure: RIGHT URETEROSCOPY ;  Surgeon: Festus Aloe, MD;  Location: [facility];  Service: Urology;  Laterality: Right;  . Cystoscopy w/ ureteral stent placement Right 02/12/2014    Procedure: CYSTOSCOPY WITH RETROGRADE PYELOGRAM/URETERAL STENT PLACEMENT;  Surgeon: Festus Aloe, MD;  Location: [facility];  Service: Urology;  Laterality: Right;  . Cystoscopy with ureteroscopy and stent placement  Right 03/05/2014    Procedure: CYSTOSCOPY WITH URETEROSCOPY AND STENT EXCHANGE, STONE EXTRACTION;  Surgeon: Festus Aloe, MD;  Location: [facility];  Service: Urology;  Laterality: Right;  . Holmium laser application Right [id number]    Procedure: HOLMIUM LASER APPLICATION;  Surgeon: Festus Aloe, MD;  Location: [facility];  Service: Urology;  Laterality: Right;  . Cystoscopy with ureteroscopy and stent placement Right 03/26/2014    Procedure: CYSTO WITH RIGHT URETEROSCOPY/STENT PLACEMENT;  Surgeon: Festus Aloe, MD;  Location: [facility];  Service: Urology;  Laterality: Right;  . Stone extraction with basket Right 03/26/2014    Procedure: STONE EXTRACTION WITH BASKET;  Surgeon: Festus Aloe, MD;  Location: [facility];  Service: Urology;  Laterality: Right;  . Total knee arthroplasty Left 02/27/2015    reports that he quit smoking about 44 years ago. His smoking use included Cigarettes. He has a 6 pack-year smoking history. He has quit using smokeless tobacco. His smokeless tobacco use included Chew. He reports that he drinks about 4.2 oz of alcohol per week. He reports that he does not use illicit drugs. family history includes Cancer in his father and sister. No Known Allergies  1.  Risk factors based on Past Medical , Social, and Family history reviewed and as indicated above with no changes 2.  Limitations in physical activities None.  No recent falls. 3.  Depression/mood No active depression or anxiety issues 4.  Hearing No defiits 5.  ADLs independent in all. 6.  Cognitive function (orientation to time and place, language, writing,  speech,memory) no short or long term memory issues.  Language and judgement intact. 7.  Home Safety no issues 8.  Height, weight, and visual acuity.  Recent weight gain of 10 pounds.  Ht stable.  No change in visual symptoms. 9.  Counseling discussed getting back started with regular  exercise and weight loss 10. Recommendation of preventive services. Continue yearly flu vaccine. Had recent shingles vaccine. Other immunizations up-to-date 11. Labs based on risk factors PSA, basic metabolic panel, lipid, hepatic 12. Care Plan as above 13. Other Providers-none 14. Written schedule of screening/prevention services given to patient.    Review of Systems  Constitutional: Negative for fever, activity change, appetite change and fatigue.  HENT: Negative for congestion, ear pain and trouble swallowing.   Eyes: Negative for pain and visual disturbance.  Respiratory: Negative for cough, shortness of breath and wheezing.   Cardiovascular: Negative for chest pain and palpitations.  Gastrointestinal: Negative for nausea, vomiting, abdominal pain, diarrhea, constipation, blood in stool, abdominal distention and rectal pain.  Endocrine: Negative for polydipsia and polyuria.  Genitourinary: Negative for dysuria, hematuria and testicular pain.  Musculoskeletal: Negative for joint swelling and arthralgias.  Skin: Negative for rash.  Neurological: Negative for dizziness, syncope and headaches.  Hematological: Negative for adenopathy.  Psychiatric/Behavioral: Negative for confusion and dysphoric mood.       Objective:   Physical Exam  Constitutional: He is oriented to person, place, and time. He appears well-developed and well-nourished. No distress.  HENT:  Head: Normocephalic and atraumatic.  Right Ear: External ear normal.  Left Ear: External ear normal.  Mouth/Throat: Oropharynx is clear and moist.  Eyes: Conjunctivae and EOM are normal. Pupils are equal, round, and reactive to light.  Neck: Normal range of motion. Neck supple. No thyromegaly present.  Cardiovascular: Normal rate, regular rhythm and normal heart sounds.   No murmur heard. Pulmonary/Chest: No respiratory distress. He has no wheezes. He has no rales.  Abdominal: Soft. Bowel sounds are normal. He exhibits no  distension and no mass. There is no tenderness. There is no rebound and no guarding.  Musculoskeletal: He exhibits no edema.  Lymphadenopathy:    He has no cervical adenopathy.  Neurological: He is alert and oriented to person, place, and time. He displays normal reflexes. No cranial nerve deficit.  Skin: No rash noted.  Right forehead-slightly scaly hyperkeratotic area about 8 mm diameter. No ulceration.  Psychiatric: He has a normal mood and affect.          Assessment & Plan:  #1 Medicare wellness visit. We discussed initiating more consistent exercise. Encouraged to lose some weight. Immunizations up-to-date #2 small actinic keratosis right face as above. We discussed risk and benefits acute with liquid nitrogen patient consented. Touch base 2 weeks if not resolving #3 hyperlipidemia. Repeat lipid and hepatic panel #4 hypertension. Borderline elevation. Lose some weight and continue monitoring. Continue current medications. Be in touch if consistently greater than 150/90

## 2015-07-03 MED ORDER — ATORVASTATIN CALCIUM 20 MG PO TABS: 20.0000 mg | ORAL_TABLET | Freq: Every day | ORAL | Status: DC

## 2015-07-03 NOTE — Addendum Note (Signed)
Addended by: Elio Forget on: 07/03/2015 03:51 PM   Modules accepted: Orders

## 2015-07-23 DIAGNOSIS — M25561 Pain in right knee: Secondary | ICD-10-CM | POA: Diagnosis not present

## 2015-07-23 DIAGNOSIS — M1711 Unilateral primary osteoarthritis, right knee: Secondary | ICD-10-CM | POA: Diagnosis not present

## 2015-07-23 DIAGNOSIS — Z96652 Presence of left artificial knee joint: Secondary | ICD-10-CM | POA: Diagnosis not present

## 2015-07-23 DIAGNOSIS — M25462 Effusion, left knee: Secondary | ICD-10-CM | POA: Diagnosis not present

## 2015-08-13 ENCOUNTER — Telehealth: Payer: Self-pay | Admitting: Family Medicine

## 2015-08-13 MED ORDER — SILDENAFIL CITRATE 100 MG PO TABS: ORAL_TABLET | ORAL | Status: DC

## 2015-08-13 NOTE — Telephone Encounter (Signed)
Left message to call back. Cialis is on pts med list. Just need to verify if he wants viagra instead.

## 2015-08-13 NOTE — Addendum Note (Signed)
Addended by: Elio Forget on: 08/13/2015 12:56 PM   Modules accepted: Orders, Medications

## 2015-08-13 NOTE — Telephone Encounter (Signed)
Patient would like a prescription for 6 Viagra tablets.  He is in [place], so please send the prescription to: [facility]:  [place]. [place]. [phone]

## 2015-08-13 NOTE — Telephone Encounter (Signed)
Spoke with pt and he would like viagra sent in bc its cheaper through insurance. Please advise on MG thanks.

## 2015-08-13 NOTE — Telephone Encounter (Signed)
Okay to send in Viagra 100 mg one half to one tablet daily as needed #6 with 3 refills

## 2015-08-13 NOTE — Telephone Encounter (Signed)
Medication sent into pharmacy for patient.  

## 2015-10-01 ENCOUNTER — Other Ambulatory Visit: Payer: Self-pay | Admitting: Family Medicine

## 2015-10-02 ENCOUNTER — Other Ambulatory Visit: Payer: Self-pay | Admitting: Family Medicine

## 2015-10-16 IMAGING — CT CT ABD-PELV W/O CM
2 of 3 series · 16 of 38 positions shown, 18 images · non-contrast
Comparison: None.

CLINICAL DATA: Right flank pain

EXAM:
CT ABDOMEN AND PELVIS WITHOUT CONTRAST
TECHNIQUE: Multidetector CT imaging of the abdomen and pelvis was performed
following the standard protocol without IV contrast.

[Series 4: lung · axial · 0.87mm/px · z∈[+1645,+1750]mm · 13 of 24 slices shown, 15 images]
[im 2/24  soft-tissue]
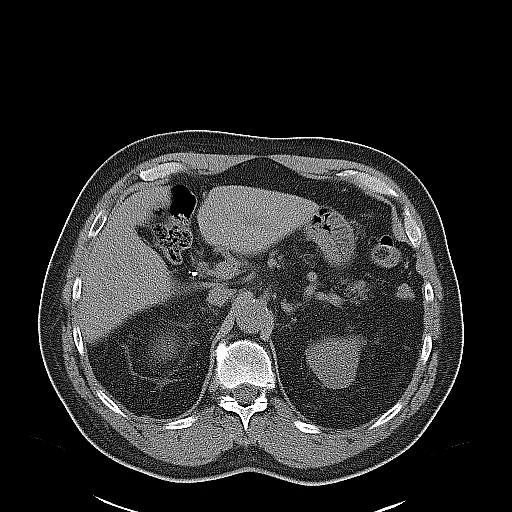
[im 2/24  bone]
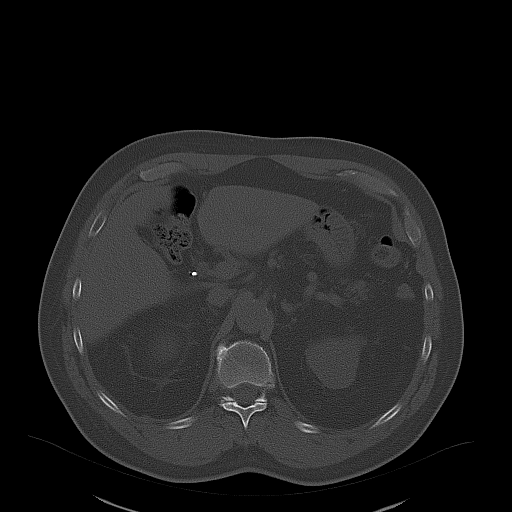
[im 4/24  soft-tissue]
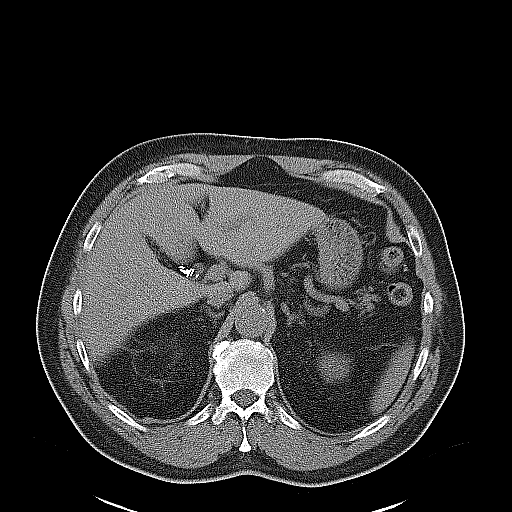
[im 6/24  soft-tissue]
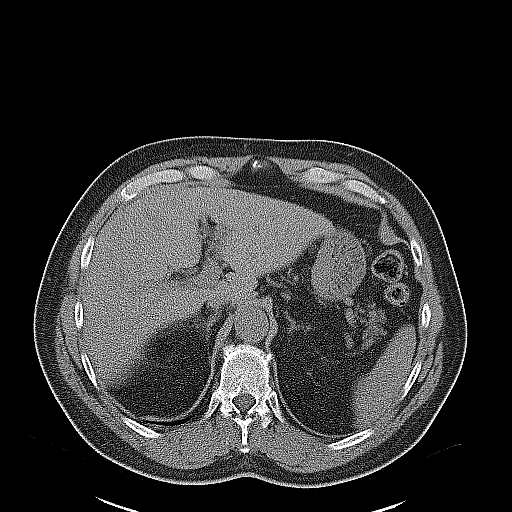
[im 7/24  soft-tissue]
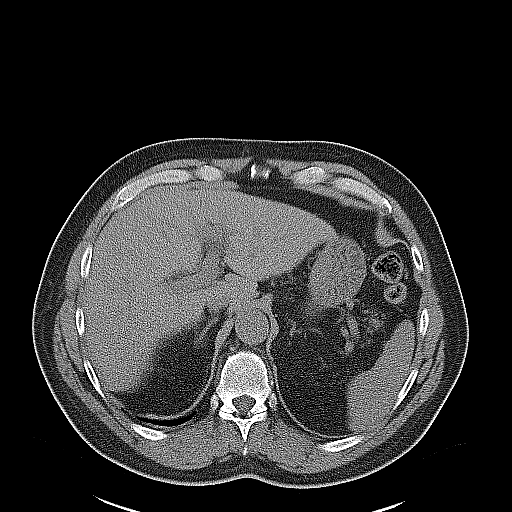
[im 9/24  soft-tissue]
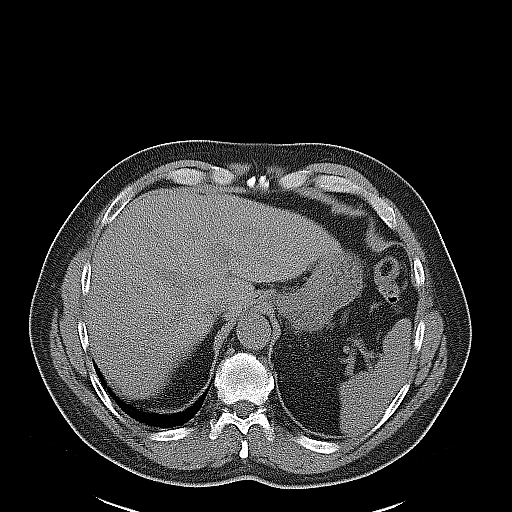
[im 11/24  soft-tissue]
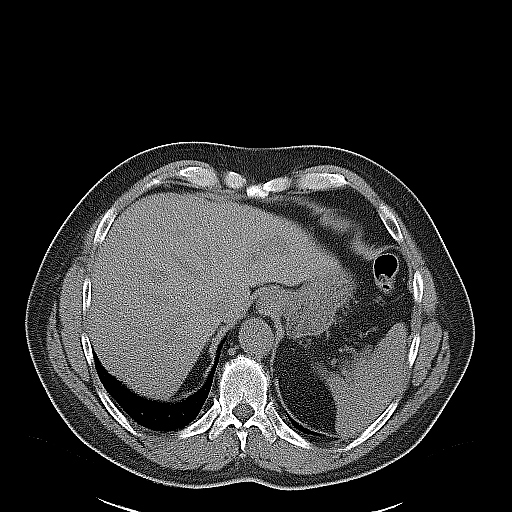
[im 13/24  soft-tissue]
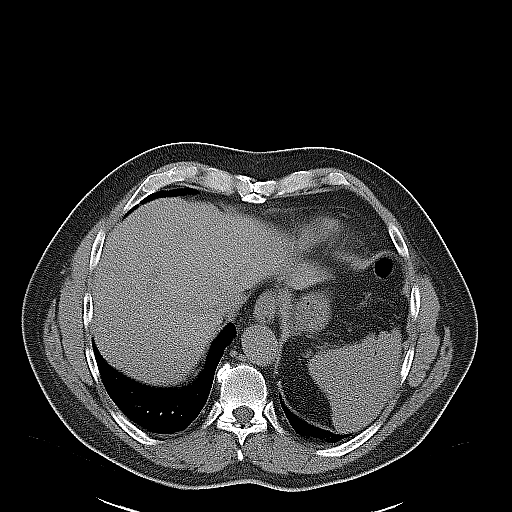
[im 14/24  soft-tissue]
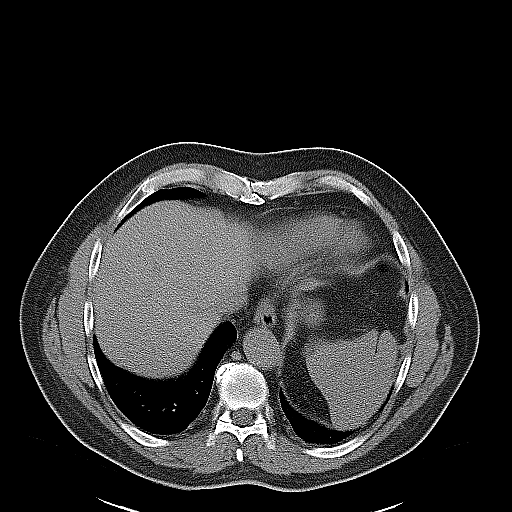
[im 16/24  soft-tissue]
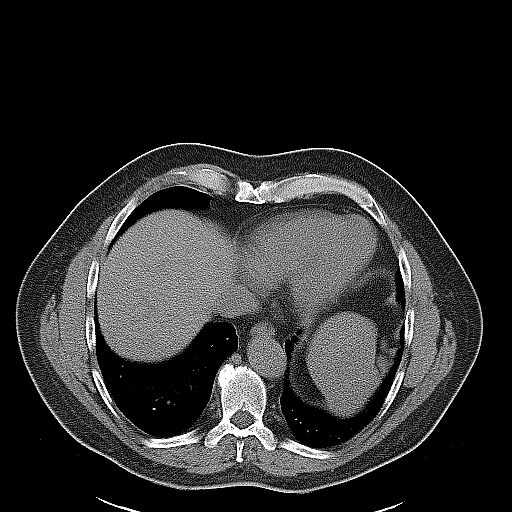
[im 16/24  bone]
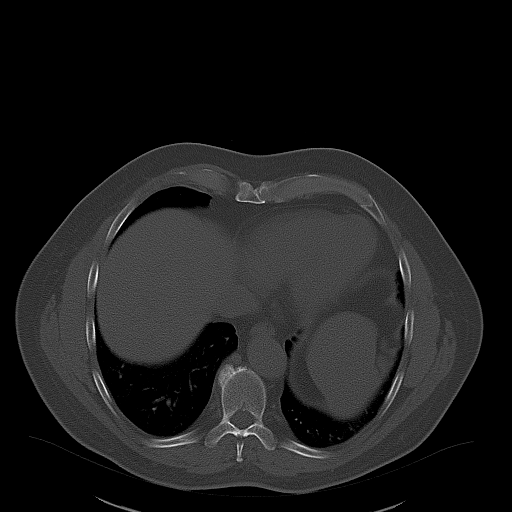
[im 18/24  soft-tissue]
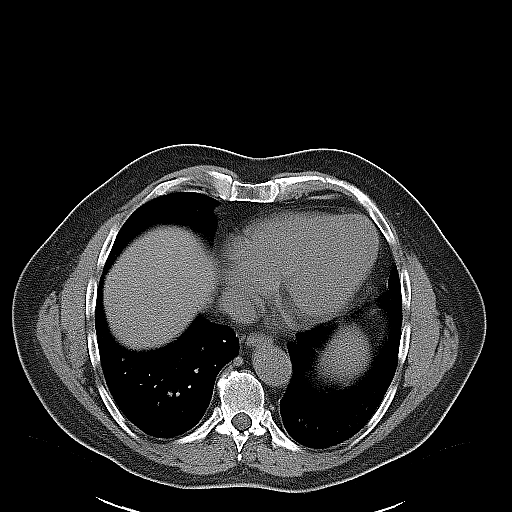
[im 19/24  soft-tissue]
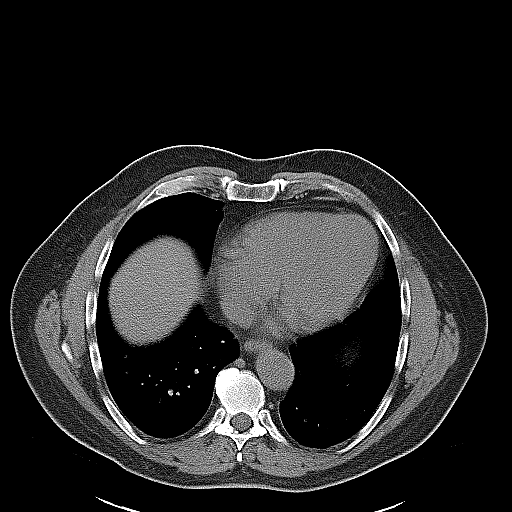
[im 21/24  soft-tissue]
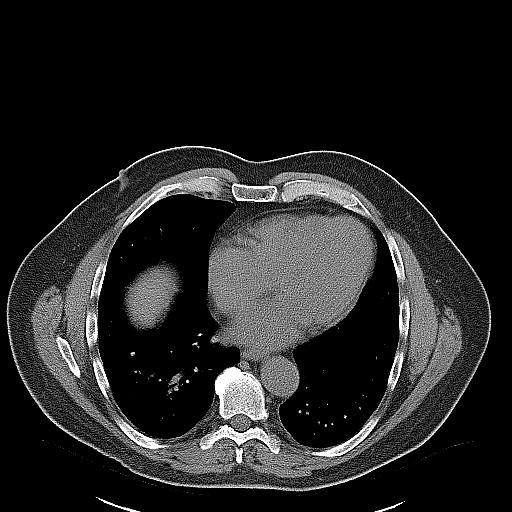
[im 23/24  soft-tissue]
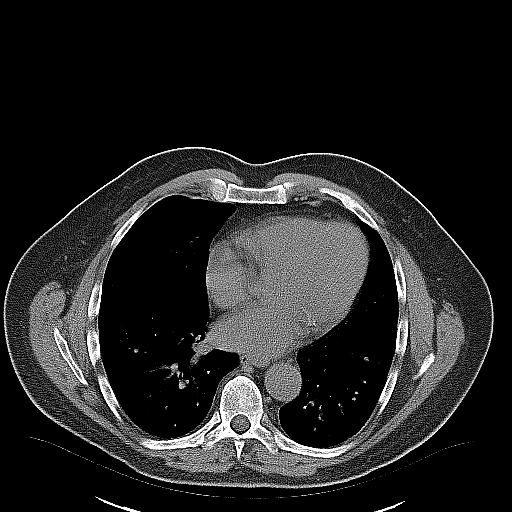

[Series 5: coronal · coronal · 0.77mm/px · 3 of 113 slices shown]
[im 38/113  soft-tissue]
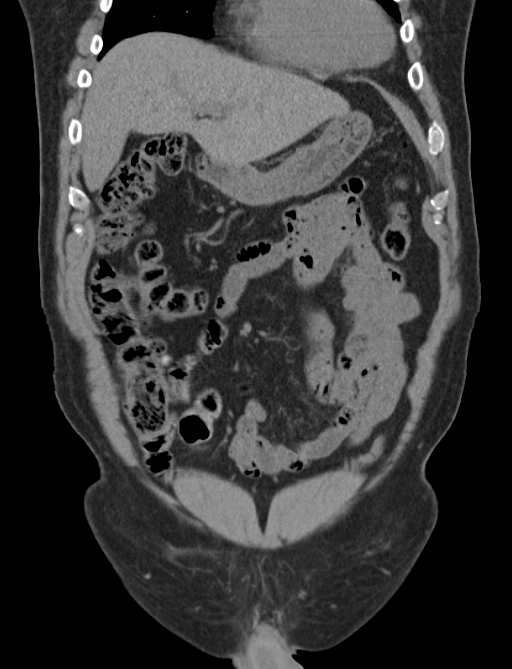
[im 50/113  soft-tissue]
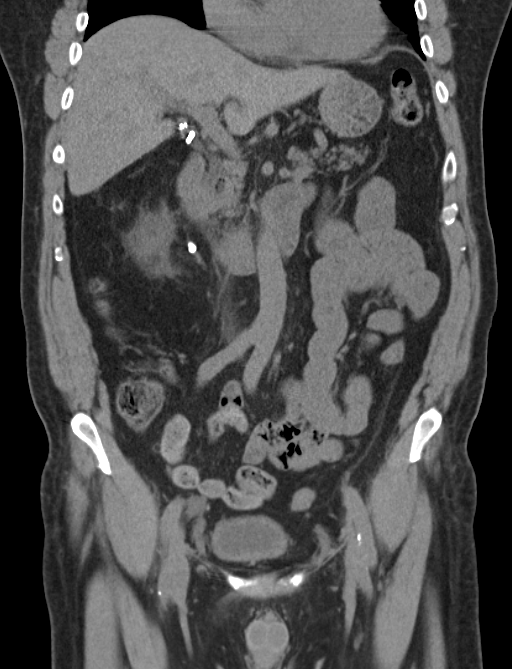
[im 63/113  soft-tissue]
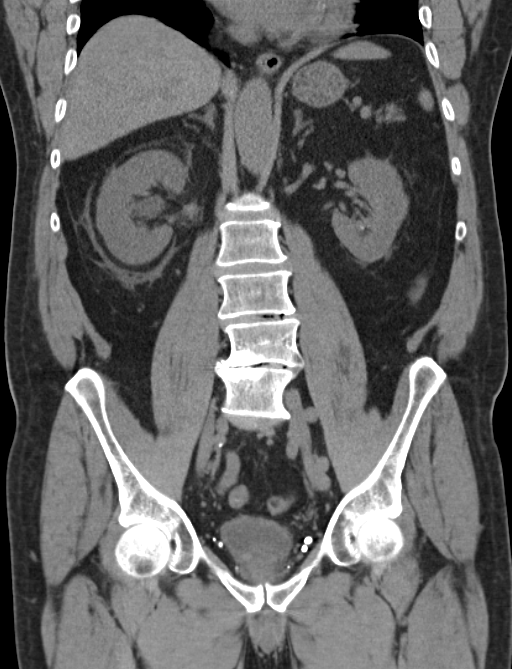

[16 of 38 positions shown; findings below may reference images not displayed]

FINDINGS: Renal: There is right renal edema and perinephric stranding. There
is mild right hydronephrosis. This is secondary to an obstructing
calculi at the right ureteropelvic junction measuring 7 mm (image
39, series 2). There is additional 3 mm and 1 mm nonobstructing
calculus within the right kidney. There is a punctate 1 mm calculus
in lower pole of the left kidney. No ureteral lithiasis

Lung bases are clear.  No pericardial fluid.

No focal hepatic lesion. The gallbladder, pancreas, spleen, adrenal
glands are normal.

The stomach, small bowel, appendix, and cecum are normal. The colon
and rectosigmoid colon are normal.

Abdominal is normal caliber. No retroperitoneal periportal
lymphadenopathy.

No free fluid the pelvis. No pelvic lymphadenopathy. No aggressive
osseous lesion.
IMPRESSION: 1. Obstructing right ureteropelvic junction calculus with moderate
hydronephrosis and perinephric stranding.
2. Bilateral nephrolithiasis.

## 2015-11-03 ENCOUNTER — Other Ambulatory Visit: Payer: Self-pay | Admitting: Family Medicine

## 2015-11-11 ENCOUNTER — Other Ambulatory Visit: Payer: Self-pay | Admitting: *Deleted

## 2015-11-11 MED ORDER — LOSARTAN POTASSIUM 100 MG PO TABS: 100.0000 mg | ORAL_TABLET | Freq: Every day | ORAL | Status: DC

## 2015-12-18 DIAGNOSIS — Z96652 Presence of left artificial knee joint: Secondary | ICD-10-CM | POA: Diagnosis not present

## 2015-12-18 DIAGNOSIS — M25561 Pain in right knee: Secondary | ICD-10-CM | POA: Diagnosis not present

## 2015-12-18 DIAGNOSIS — G8929 Other chronic pain: Secondary | ICD-10-CM | POA: Diagnosis not present

## 2015-12-18 DIAGNOSIS — Z87891 Personal history of nicotine dependence: Secondary | ICD-10-CM | POA: Diagnosis not present

## 2016-02-17 DIAGNOSIS — M25561 Pain in right knee: Secondary | ICD-10-CM | POA: Diagnosis not present

## 2016-02-17 DIAGNOSIS — M1711 Unilateral primary osteoarthritis, right knee: Secondary | ICD-10-CM | POA: Diagnosis not present

## 2016-02-24 DIAGNOSIS — M1711 Unilateral primary osteoarthritis, right knee: Secondary | ICD-10-CM | POA: Diagnosis not present

## 2016-02-24 DIAGNOSIS — M25561 Pain in right knee: Secondary | ICD-10-CM | POA: Diagnosis not present

## 2016-03-20 ENCOUNTER — Encounter: Payer: Self-pay | Admitting: Family Medicine

## 2016-03-23 NOTE — Telephone Encounter (Signed)
Pt need new Rx for Amlodipine   [facility] [phone]    [phone]  Pt would like to have it refilled today if possible will be leaving [place] going to [place].

## 2016-03-23 NOTE — Telephone Encounter (Addendum)
Pharmacy states they have been trying to contact us since last week. Pt leaving early in the am  [facility] # [place] may be listed  Under [employer] [phone]  90 day day

## 2016-03-24 ENCOUNTER — Other Ambulatory Visit: Payer: Self-pay

## 2016-03-24 MED ORDER — AMLODIPINE BESYLATE 5 MG PO TABS: 5.0000 mg | ORAL_TABLET | Freq: Every day | ORAL | 2 refills | Status: DC

## 2016-05-07 ENCOUNTER — Ambulatory Visit (INDEPENDENT_AMBULATORY_CARE_PROVIDER_SITE_OTHER): Payer: Medicare Other | Admitting: Family Medicine

## 2016-05-07 ENCOUNTER — Encounter: Payer: Self-pay | Admitting: Family Medicine

## 2016-05-07 VITALS — BP 124/82 | HR 78 | Temp 98.1°F | Ht 71.0 in | Wt 232.0 lb

## 2016-05-07 DIAGNOSIS — M25471 Effusion, right ankle: Secondary | ICD-10-CM

## 2016-05-07 DIAGNOSIS — M25472 Effusion, left ankle: Secondary | ICD-10-CM | POA: Diagnosis not present

## 2016-05-07 DIAGNOSIS — R229 Localized swelling, mass and lump, unspecified: Secondary | ICD-10-CM | POA: Diagnosis not present

## 2016-05-07 DIAGNOSIS — R5383 Other fatigue: Secondary | ICD-10-CM

## 2016-05-07 DIAGNOSIS — Z23 Encounter for immunization: Secondary | ICD-10-CM | POA: Diagnosis not present

## 2016-05-07 LAB — BASIC METABOLIC PANEL
BUN: 18 mg/dL (ref 6–23)
CALCIUM: 9.6 mg/dL (ref 8.4–10.5)
CO2: 30 meq/L (ref 19–32)
Chloride: 105 mEq/L (ref 96–112)
Creatinine, Ser: 0.91 mg/dL (ref 0.40–1.50)
GFR: 87.23 mL/min (ref >60.00)
Glucose, Bld: 96 mg/dL (ref 70–99)
Potassium: 4.5 mEq/L (ref 3.5–5.1)
Sodium: 140 mEq/L (ref 135–145)

## 2016-05-07 LAB — HEPATIC FUNCTION PANEL
ALT: 27 U/L (ref 0–53)
AST: 30 U/L (ref 0–37)
Albumin: 4.5 g/dL (ref 3.5–5.2)
Alkaline Phosphatase: 82 U/L (ref 39–117)
BILIRUBIN TOTAL: 0.8 mg/dL (ref 0.2–1.2)
Bilirubin, Direct: 0.1 mg/dL (ref 0.0–0.3)
Total Protein: 7.3 g/dL (ref 6.0–8.3)

## 2016-05-07 LAB — TSH: TSH: 1.86 u[IU]/mL (ref 0.35–4.50)

## 2016-05-07 NOTE — Patient Instructions (Signed)
Reduce the Amlodipine to one half tablet daily Watch your sodium intake Let's plan on follow up in one month.

## 2016-05-07 NOTE — Progress Notes (Signed)
Pre visit review using our clinic review tool, if applicable. No additional management support is needed unless otherwise documented below in the visit note. 

## 2016-05-07 NOTE — Addendum Note (Signed)
Addended by: Elio Forget on: 05/07/2016 10:58 AM   Modules accepted: Orders

## 2016-05-07 NOTE — Progress Notes (Signed)
Subjective:     Patient ID: Lee Smith, male   DOB: Nov 22, 1945, 70 y.o.   MRN: [id number]  HPI Patient seen with new issue of bilateral ankle edema over the past 2-3 months. Symptoms are somewhat intermittent. He notices that his edema is worse late in the day. Denies any orthopnea. No dyspnea with exertion. He takes meloxicam all his daily and also is on amlodipine 5 mg daily for hypertension. His blood pressure has been fairly well controlled. He has been traveling a lot recently and perhaps consuming more sodium. No history of CHF.  Patient has noted nodular lesion right temporal region of the face. Recently bleeding anywhere traumatizes area. Slowly growing in size over the past several months. No past history of skin cancer.  Past Medical History:  Diagnosis Date  . Allergic rhinitis   . Arthritis    bilateral knees; has planned partial knee replacement next month  . Genital warts   . History of kidney stones 11/2013   lithotripsy for right ureteral stone  . Hx of colonic polyps    2008  . Hyperlipidemia   . Hypertension   . Right nephrolithiasis    Past Surgical History:  Procedure Laterality Date  . CHOLECYSTECTOMY  2002  . CYSTOSCOPY W/ URETERAL STENT PLACEMENT Right 02/12/2014   Procedure: CYSTOSCOPY WITH RETROGRADE PYELOGRAM/URETERAL STENT PLACEMENT;  Surgeon: Festus Aloe, MD;  Location: [facility];  Service: Urology;  Laterality: Right;  . CYSTOSCOPY WITH RETROGRADE PYELOGRAM, URETEROSCOPY AND STENT PLACEMENT Right 02/12/2014   Procedure: RIGHT URETEROSCOPY ;  Surgeon: Festus Aloe, MD;  Location: [facility];  Service: Urology;  Laterality: Right;  . CYSTOSCOPY WITH URETEROSCOPY AND STENT PLACEMENT Right 03/05/2014   Procedure: CYSTOSCOPY WITH URETEROSCOPY AND STENT EXCHANGE, STONE EXTRACTION;  Surgeon: Festus Aloe, MD;  [facility];  Service: Urology;  Laterality: Right;  . CYSTOSCOPY WITH URETEROSCOPY  AND STENT PLACEMENT Right 03/26/2014   Procedure: CYSTO WITH RIGHT URETEROSCOPY/STENT PLACEMENT;  Surgeon: Festus Aloe, MD;  Location: [facility];  Service: Urology;  Laterality: Right;  . EXTRACORPOREAL SHOCK WAVE LITHOTRIPSY Right 12-17-2013  . HOLMIUM LASER APPLICATION Right [id number]   Procedure: HOLMIUM LASER APPLICATION;  Surgeon: Festus Aloe, MD;  Location: [facility];  Service: Urology;  Laterality: Right;  . INGUINAL HERNIA REPAIR Bilateral right 2003/   left 2004  . STONE EXTRACTION WITH BASKET Right 03/26/2014   Procedure: STONE EXTRACTION WITH BASKET;  Surgeon: Festus Aloe, MD;  Location: [facility];  Service: Urology;  Laterality: Right;  . 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 UMBILICAL HERNIA REPAIR  2000    reports that he quit smoking about 45 years ago. His smoking use included Cigarettes. He has a 6.00 pack-year smoking history. He has quit using smokeless tobacco. His smokeless tobacco use included Chew. He reports that he drinks about 4.2 oz of alcohol per week . He reports that he does not use drugs. family history includes Cancer in his father and sister. No Known Allergies   Review of Systems  Constitutional: Negative for appetite change, fever and unexpected weight change.  Respiratory: Negative for cough and shortness of breath.   Cardiovascular: Positive for leg swelling. Negative for chest pain.  Endocrine: Negative for polydipsia and polyuria.  Genitourinary: Negative for dysuria.  Neurological: Negative for dizziness and headaches.  Hematological: Negative for adenopathy. Does not bruise/bleed easily.       Objective:   Physical Exam  Constitutional:  He appears well-developed and well-nourished.  Neck: Neck supple. No thyromegaly present.  Cardiovascular: Normal rate and regular rhythm.   Pulmonary/Chest: Effort normal and breath sounds normal. No respiratory distress. He has no wheezes. He  has no rales.  Musculoskeletal: He exhibits edema.  Patient has trace edema ankles and lower legs bilaterally. Good distal pulses  Lymphadenopathy:    He has no cervical adenopathy.  Skin: No rash noted.  Patient has nodular lesion right temporal region which is about 8 mm diameter. This is a fleshy nodular lesion with some crusted surface on the posterior aspect. Slight umbilication of the center       Assessment:     #1 bilateral ankle edema. Relatively mild. We explained that meloxicam and amlodipine may be contributing. Likely related to venous stasis. No clinical suspicion for CHF  #2 hypertension stable and at goal  #3 probable basal cell carcinoma right face    Plan:     -Schedule for skin lesion excision -Try reducing amlodipine to 2.5 mg daily -Avoid meloxicam is much as possible -Check labs with basic metabolic panel, TSH, hepatic panel -Reassess blood pressure within one month  [name] MD [facility]

## 2016-05-12 ENCOUNTER — Encounter: Payer: Self-pay | Admitting: Family Medicine

## 2016-05-20 ENCOUNTER — Other Ambulatory Visit: Payer: Self-pay

## 2016-05-20 MED ORDER — LOSARTAN POTASSIUM 100 MG PO TABS: 100.0000 mg | ORAL_TABLET | Freq: Every day | ORAL | 5 refills | Status: DC

## 2016-06-04 ENCOUNTER — Ambulatory Visit (INDEPENDENT_AMBULATORY_CARE_PROVIDER_SITE_OTHER): Payer: Medicare Other | Admitting: Family Medicine

## 2016-06-04 VITALS — BP 130/88 | HR 76 | Temp 98.2°F | Ht 71.0 in | Wt 234.0 lb

## 2016-06-04 DIAGNOSIS — R600 Localized edema: Secondary | ICD-10-CM

## 2016-06-04 DIAGNOSIS — I10 Essential (primary) hypertension: Secondary | ICD-10-CM

## 2016-06-04 MED ORDER — TADALAFIL 20 MG PO TABS: 20.0000 mg | ORAL_TABLET | Freq: Every day | ORAL | 5 refills | Status: DC | PRN

## 2016-06-04 NOTE — Progress Notes (Signed)
Pre visit review using our clinic review tool, if applicable. No additional management support is needed unless otherwise documented below in the visit note. 

## 2016-06-04 NOTE — Patient Instructions (Signed)
Monitor blood pressure and be in touch if consistently > 140/90.   

## 2016-06-04 NOTE — Progress Notes (Signed)
Subjective:     Patient ID: Lee Smith, male   DOB: 10-Apr-1946, 70 y.o.   MRN: [id number]  HPI Patient seen for follow-up regarding recent bilateral leg edema. He reduced his amlodipine to half tablet but did not see much change in the edema. He then held meloxicam and edema resolved. He did another trial of going back on meloxicam and edema appeared and then resolved after he stopped taking the meloxicam. Has been taking as needed Aleve since then without any recurrent edema. No dyspnea. Recent skin lesion right temporal area fell off and has not recurred since then.  Past Medical History:  Diagnosis Date  . Allergic rhinitis   . Arthritis    bilateral knees; has planned partial knee replacement next month  . Genital warts   . History of kidney stones 11/2013   lithotripsy for right ureteral stone  . Hx of colonic polyps    2008  . Hyperlipidemia   . Hypertension   . Right nephrolithiasis    Past Surgical History:  Procedure Laterality Date  . CHOLECYSTECTOMY  2002  . CYSTOSCOPY W/ URETERAL STENT PLACEMENT Right 02/12/2014   Procedure: CYSTOSCOPY WITH RETROGRADE PYELOGRAM/URETERAL STENT PLACEMENT;  Surgeon: Festus Aloe, MD;  [facility]  Service: Urology;  Laterality: Right;  . CYSTOSCOPY WITH RETROGRADE PYELOGRAM, URETEROSCOPY AND STENT PLACEMENT Right 02/12/2014   Procedure: RIGHT URETEROSCOPY ;  Surgeon: Festus Aloe, MD;  Location: [facility];  Service: Urology;  Laterality: Right;  . CYSTOSCOPY WITH URETEROSCOPY AND STENT PLACEMENT Right 03/05/2014   Procedure: CYSTOSCOPY WITH URETEROSCOPY AND STENT EXCHANGE, STONE EXTRACTION;  Surgeon: Festus Aloe, MD;  Location[facility];  Service: Urology;  Laterality: Right;  . CYSTOSCOPY WITH URETEROSCOPY AND STENT PLACEMENT Right 03/26/2014   Procedure: CYSTO WITH RIGHT URETEROSCOPY/STENT PLACEMENT;  Surgeon: Festus Aloe, MD;  Location: [facility];   Service: Urology;  Laterality: Right;  . EXTRACORPOREAL SHOCK WAVE LITHOTRIPSY Right 12-17-2013  . HOLMIUM LASER APPLICATION Right [id number]   Procedure: HOLMIUM LASER APPLICATION;  Surgeon: Festus Aloe, MD;  Location: [facility];  Service: Urology;  Laterality: Right;  . INGUINAL HERNIA REPAIR Bilateral right 2003/   left 2004  . STONE EXTRACTION WITH BASKET Right 03/26/2014   Procedure: STONE EXTRACTION WITH BASKET;  Surgeon: Festus Aloe, MD;  Location: [facility];  Service: Urology;  Laterality: Right;  . 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 UMBILICAL HERNIA REPAIR  2000    reports that he quit smoking about 45 years ago. His smoking use included Cigarettes. He has a 6.00 pack-year smoking history. He has quit using smokeless tobacco. His smokeless tobacco use included Chew. He reports that he drinks about 4.2 oz of alcohol per week . He reports that he does not use drugs. family history includes Cancer in his father and sister. No Known Allergies   Review of Systems  Constitutional: Negative for fatigue.  Eyes: Negative for visual disturbance.  Respiratory: Negative for cough, chest tightness and shortness of breath.   Cardiovascular: Negative for chest pain, palpitations and leg swelling.  Neurological: Negative for dizziness, syncope, weakness, light-headedness and headaches.       Objective:   Physical Exam  Constitutional: He is oriented to person, place, and time. He appears well-developed and well-nourished.  HENT:  Right Ear: External ear normal.  Left Ear: External ear normal.  Mouth/Throat: Oropharynx is clear and moist.  Eyes: Pupils are equal, round, and reactive to  light.  Neck: Neck supple. No thyromegaly present.  Cardiovascular: Normal rate and regular rhythm.   Pulmonary/Chest: Effort normal and breath sounds normal. No respiratory distress. He has no wheezes. He has no rales.  Musculoskeletal: He exhibits no edema.   Neurological: He is alert and oriented to person, place, and time.       Assessment:     #1 hypertension stable  #2 recent mild bilateral leg edema resolved after stopping meloxicam    Plan:     -Avoid nonsteroidals is much as possible -Continue current blood pressure medication regimen -Watch sodium intake closely -He will schedule physical in the next month -Needs lipid panel which has not been done since initiating Lipitor last year  [name] MD [facility]

## 2016-06-06 ENCOUNTER — Encounter: Payer: Self-pay | Admitting: Family Medicine

## 2016-06-07 ENCOUNTER — Other Ambulatory Visit: Payer: Self-pay

## 2016-06-07 MED ORDER — TADALAFIL 20 MG PO TABS: 20.0000 mg | ORAL_TABLET | Freq: Every day | ORAL | 5 refills | Status: DC | PRN

## 2016-06-16 DIAGNOSIS — M1711 Unilateral primary osteoarthritis, right knee: Secondary | ICD-10-CM | POA: Diagnosis not present

## 2016-06-23 DIAGNOSIS — I10 Essential (primary) hypertension: Secondary | ICD-10-CM | POA: Diagnosis not present

## 2016-06-23 DIAGNOSIS — Z87891 Personal history of nicotine dependence: Secondary | ICD-10-CM | POA: Diagnosis not present

## 2016-06-23 DIAGNOSIS — M25462 Effusion, left knee: Secondary | ICD-10-CM | POA: Diagnosis not present

## 2016-06-23 DIAGNOSIS — Z471 Aftercare following joint replacement surgery: Secondary | ICD-10-CM | POA: Diagnosis not present

## 2016-06-23 DIAGNOSIS — Z96652 Presence of left artificial knee joint: Secondary | ICD-10-CM | POA: Diagnosis not present

## 2016-06-23 DIAGNOSIS — M8588 Other specified disorders of bone density and structure, other site: Secondary | ICD-10-CM | POA: Diagnosis not present

## 2016-07-08 ENCOUNTER — Telehealth: Payer: Self-pay

## 2016-07-08 NOTE — Telephone Encounter (Signed)
Call to Lee Smith; Coming in the 26th to see Dr. Elease Hashimoto at [facility] Agreed to come in early at 15 or 8:15 to complete AWV

## 2016-07-13 ENCOUNTER — Encounter: Payer: Self-pay | Admitting: Family Medicine

## 2016-07-13 ENCOUNTER — Ambulatory Visit (INDEPENDENT_AMBULATORY_CARE_PROVIDER_SITE_OTHER): Payer: Medicare Other | Admitting: Family Medicine

## 2016-07-13 VITALS — BP 134/84 | HR 69 | Ht 73.0 in | Wt 237.0 lb

## 2016-07-13 DIAGNOSIS — I10 Essential (primary) hypertension: Secondary | ICD-10-CM

## 2016-07-13 DIAGNOSIS — Z0000 Encounter for general adult medical examination without abnormal findings: Secondary | ICD-10-CM | POA: Diagnosis not present

## 2016-07-13 DIAGNOSIS — N528 Other male erectile dysfunction: Secondary | ICD-10-CM | POA: Diagnosis not present

## 2016-07-13 DIAGNOSIS — Z125 Encounter for screening for malignant neoplasm of prostate: Secondary | ICD-10-CM

## 2016-07-13 DIAGNOSIS — E785 Hyperlipidemia, unspecified: Secondary | ICD-10-CM

## 2016-07-13 LAB — HEPATIC FUNCTION PANEL
ALK PHOS: 75 U/L (ref 39–117)
ALT: 26 U/L (ref 0–53)
AST: 23 U/L (ref 0–37)
Albumin: 4.4 g/dL (ref 3.5–5.2)
BILIRUBIN DIRECT: 0.1 mg/dL (ref 0.0–0.3)
BILIRUBIN TOTAL: 0.7 mg/dL (ref 0.2–1.2)
Total Protein: 6.6 g/dL (ref 6.0–8.3)

## 2016-07-13 LAB — LIPID PANEL
CHOLESTEROL: 155 mg/dL (ref 0–200)
HDL: 49.9 mg/dL (ref >39.00)
LDL Cholesterol: 93 mg/dL (ref 0–99)
NonHDL: 105.46
Total CHOL/HDL Ratio: 3
Triglycerides: 62 mg/dL (ref 0.0–149.0)
VLDL: 12.4 mg/dL (ref 0.0–40.0)

## 2016-07-13 LAB — BASIC METABOLIC PANEL
BUN: 22 mg/dL (ref 6–23)
CALCIUM: 9.2 mg/dL (ref 8.4–10.5)
CO2: 26 mEq/L (ref 19–32)
CREATININE: 0.89 mg/dL (ref 0.40–1.50)
Chloride: 108 mEq/L (ref 96–112)
GFR: 89.45 mL/min (ref >60.00)
Glucose, Bld: 90 mg/dL (ref 70–99)
POTASSIUM: 4.1 meq/L (ref 3.5–5.1)
Sodium: 142 mEq/L (ref 135–145)

## 2016-07-13 LAB — PSA: PSA: 2.68 ng/mL (ref 0.10–4.00)

## 2016-07-13 NOTE — Progress Notes (Signed)
Subjective:   Lee Smith is a 70 y.o. male who presents for Medicare Annual/Subsequent preventive examination.  The Patient was informed that the wellness visit is to identify future health risk and educate and initiate measures that can reduce risk for increased disease through the lifespan.    NO ROS; Medicare Wellness Visit Psychosocial Just got married in August; after marriage of 84 years 1 and wife as 3 dtr in lives in [place] in the area   Describes health as good, fair or great? Good   Preventive Screening -Counseling & Management  (sister had breast cancer and father had prostate CA) Colonoscopy 12/2011; there is a 5 year repeat; 12/2016 Dr Oletta Lamas told him 10 year repeat   Smoking history/ Former smoker, quit in [place] 6 pack years   Smokeless tobacco: used chew? No   ETOH: yes / never more than 1 a week   RISK FACTORS Regular exercise  Goes to the gym 20 min on bike and works out  Does not do a lot of resistance   Diet Breakfast; oatmeal with bb Sausage biscuit  Lunch depends on when he ate lunch  Supper; cook a meal 3/4 of the time   Fall risk  Mobility of Functional changes this year?  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 Baird Cancer; retina specialist   Hearing screen; done 1st time Declines for now; left ear hears ringing   Depression Screen no  PhQ 2: negative  Activities of Daily Living - See functional screen   Cognitive testing; no  Ad8 score; 0 or less than 2  MMSE deferred or completed if  AD8 + 2 issues  Advanced Directives / will bring in a copy   Patient Care Team: [name], MD as PCP - General   Immunization History  Administered Date(s) Administered  . Influenza Split 06/08/2011, 04/19/2012  . Influenza Whole 06/04/2009, 04/16/2010  . Influenza, High Dose Seasonal PF 05/07/2016  . Influenza,inj,Quad PF,36+ Mos 04/23/2013, 05/28/2014  . Influenza-Unspecified 06/16/2015  . Pneumococcal Conjugate-13 05/28/2014  . Pneumococcal Polysaccharide-23 04/16/2010  . Td 07/20/2007  . Zoster 06/16/2015   Required Immunizations needed today  Screening test up to date or reviewed for plan of completion There are no preventive care reminders to display for this patient.   Cardiac Risk Factors include: advanced age (>11men, >8 women);dyslipidemia;hypertension;male gender     Objective:    Vitals: BP 134/84   Pulse 69   Ht 6\' 1"  (1.854 m)   Wt 237 lb (107.5 kg)   SpO2 94%   BMI 31.27 kg/m   Body mass index is 31.27 kg/m.  Tobacco History  Smoking Status  . Former Smoker  . Packs/day: 1.00  . Years: 6.00  . Types: Cigarettes  . Quit date: 10/12/1970  Smokeless Tobacco  . Former Systems developer  . Types: Chew     Counseling given: Not Answered   Past Medical History:  Diagnosis Date  . Allergic rhinitis   .  Arthritis    bilateral knees; has planned partial knee replacement next month  . Genital warts   . History of kidney stones 11/2013   lithotripsy for right ureteral stone  . Hx of colonic polyps    2008  . Hyperlipidemia   . Hypertension   . Right nephrolithiasis    Past Surgical History:  Procedure Laterality Date  . CHOLECYSTECTOMY  2002  . CYSTOSCOPY W/ URETERAL STENT PLACEMENT Right 02/12/2014   Procedure: CYSTOSCOPY WITH RETROGRADE PYELOGRAM/URETERAL STENT PLACEMENT;  Surgeon: Festus Aloe, MD;  Location: [facility];  Service: Urology;  Laterality: Right;  . CYSTOSCOPY WITH RETROGRADE PYELOGRAM, URETEROSCOPY AND STENT  PLACEMENT Right 02/12/2014   Procedure: RIGHT URETEROSCOPY ;  Surgeon: Festus Aloe, MD;  Location: [facility];  Service: Urology;  Laterality: Right;  . CYSTOSCOPY WITH URETEROSCOPY AND STENT PLACEMENT Right 03/05/2014   Procedure: CYSTOSCOPY WITH URETEROSCOPY AND STENT EXCHANGE, STONE EXTRACTION;  Surgeon: Festus Aloe, MD;  Location: [facility];  Service: Urology;  Laterality: Right;  . CYSTOSCOPY WITH URETEROSCOPY AND STENT PLACEMENT Right 03/26/2014   Procedure: CYSTO WITH RIGHT URETEROSCOPY/STENT PLACEMENT;  Surgeon: Festus Aloe, MD;  Location: [facility];  Service: Urology;  Laterality: Right;  . EXTRACORPOREAL SHOCK WAVE LITHOTRIPSY Right 12-17-2013  . HOLMIUM LASER APPLICATION Right [id number]   Procedure: HOLMIUM LASER APPLICATION;  Surgeon: Festus Aloe, MD;  Location: [facility];  Service: Urology;  Laterality: Right;  . INGUINAL HERNIA REPAIR Bilateral right 2003/   left 2004  . STONE EXTRACTION WITH BASKET Right 03/26/2014   Procedure: STONE EXTRACTION WITH BASKET;  Surgeon: Festus Aloe, MD;  Location: [facility];  Service: Urology;  Laterality: Right;  . 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 UMBILICAL HERNIA REPAIR  2000   Family History  Problem Relation Age of Onset  . Cancer Father     prostate  . Cancer Sister     breast  . Arthritis      family hx  . Hyperlipidemia      family hx  . Hypertension      family hx   History  Sexual Activity  . Sexual activity: Not on file    Outpatient Encounter Prescriptions as of 07/13/2016  Medication Sig  . acetaminophen (TYLENOL) 500 MG tablet Take 500 mg by mouth every 6 (six) hours as needed.  Marland Kitchen albuterol (PROVENTIL HFA;VENTOLIN HFA) 108 (90 BASE) MCG/ACT inhaler Inhale 2 puffs into the lungs every 6 (six) hours as needed for wheezing or shortness of breath.  Marland Kitchen amLODipine (NORVASC) 5 MG tablet Take 1 tablet (5 mg total) by  mouth daily.  Marland Kitchen aspirin EC 81 MG tablet Take 81 mg by mouth daily.  Marland Kitchen atorvastatin (LIPITOR) 20 MG tablet Take 1 tablet (20 mg total) by mouth daily.  . colchicine 0.6 MG tablet Take 1 tablet (0.6 mg total) by mouth 2 (two) times daily.  Marland Kitchen losartan (COZAAR) 100 MG tablet Take 1 tablet (100 mg total) by mouth daily.  . Naproxen Sodium (ALEVE) 220 MG CAPS Take by mouth.  . polyvinyl alcohol (LIQUIFILM TEARS) 1.4 % ophthalmic solution Place 1 drop into both eyes as needed for dry eyes.  . tadalafil (CIALIS) 20 MG tablet Take 1 tablet (20 mg total) by mouth daily as needed for erectile dysfunction.  . meloxicam (MOBIC) 15 MG tablet Take 15 mg by mouth daily. As needed for knee pain.   No facility-administered encounter medications on file as of  07/13/2016.     Activities of Daily Living In your present state of health, do you have any difficulty performing the following activities: 07/13/2016  Hearing? N  Vision? N  Difficulty concentrating or making decisions? N  Walking or climbing stairs? N  Dressing or bathing? N  Doing errands, shopping? N  Preparing Food and eating ? N  Using the Toilet? N  In the past six months, have you accidently leaked urine? N  Do you have problems with loss of bowel control? N  Managing your Medications? N  Managing your Finances? N  Housekeeping or managing your Housekeeping? N  Some recent data might be hidden    Patient Care Team: [name], MD as PCP - General   Assessment:     Exercise Activities and Dietary recommendations Current Exercise Habits: Structured exercise class, Time (Minutes): 60, Frequency (Times/Week): 3 (then very active durin the day ), Weekly Exercise (Minutes/Week): 180, Intensity: Moderate  Goals    . Exercise 150 minutes per week (moderate activity)          Will exercise more when knee is done;       Fall Risk Fall Risk  07/13/2016 07/02/2015 10/25/2014 10/08/2013  Falls in the past year? No No No No    Depression Screen PHQ 2/9 Scores 07/13/2016 07/02/2015 10/25/2014 10/08/2013  PHQ - 2 Score 0 0 0 0    Cognitive Function no         Immunization History  Administered Date(s) Administered  . Influenza Split 06/08/2011, 04/19/2012  . Influenza Whole 06/04/2009, 04/16/2010  . Influenza, High Dose Seasonal PF 05/07/2016  . Influenza,inj,Quad PF,36+ Mos 04/23/2013, 05/28/2014  . Influenza-Unspecified 06/16/2015  . Pneumococcal Conjugate-13 05/28/2014  . Pneumococcal Polysaccharide-23 04/16/2010  . Td 07/20/2007  . Zoster 06/16/2015   Screening Tests Health Maintenance  Topic Date Due  . Hepatitis C Screening  06/04/2026 (Originally 06/06/1946)  . TETANUS/TDAP  07/19/2017  . COLONOSCOPY  12/26/2021  . INFLUENZA VACCINE  Completed  . ZOSTAVAX  Completed  . PNA vac Low Risk Adult  Completed      Plan:      PCP Notes  Health Maintenance is up to date  States colonoscopy is 10 years as is in the system  Eye exam 2 years overdue but will schedule when he gets back from [facility] Knee surgery in April   Abnormal Screens none but to repeat lipids today  Referrals left ear ringing  but declines hearing screen;   Patient concerns; none;   Nurse Concerns; none  Next PCP apt today    During the course of the visit the patient was educated and counseled about the following appropriate screening and preventive services:   Vaccines to include Pneumoccal, Influenza, Hepatitis B, Td, Zostavax, HCV  Electrocardiogram  Cardiovascular Disease  Colorectal cancer screening  Diabetes screening  Prostate Cancer Screening  Glaucoma screening  Nutrition counseling   Smoking cessation counseling  Patient Instructions (the written plan) was given to the patient.    Wynetta Fines, RN  07/13/2016

## 2016-07-13 NOTE — Progress Notes (Signed)
Subjective:     Patient ID: Lee Smith, male   DOB: 1945-09-10, 70 y.o.   MRN: [id number]  HPI Patient is here for Medicare wellness visit this morning and is also here for medical follow-up. He is looking at getting right total knee replacement in April. He is followed by orthopedist in [place]. He's had previous left total knee replacement had some recent mild edema left knee but overall doing fairly well. His chronic problems include history of erectile dysfunction, hypertension, hyperlipidemia, gout. No recent gout flareups. Does not take any prophylaxis. Medications reviewed. Compliant with all. Blood pressure has been stable. No hypertension issues. No dizziness. No headaches. No chest pains. Walking has been limited because of arthritis issues. Denies any obstructive urinary symptoms.  ED treated with Cialis.    Past Medical History:  Diagnosis Date  . Allergic rhinitis   . Arthritis    bilateral knees; has planned partial knee replacement next month  . Genital warts   . History of kidney stones 11/2013   lithotripsy for right ureteral stone  . Hx of colonic polyps    2008  . Hyperlipidemia   . Hypertension   . Right nephrolithiasis    Past Surgical History:  Procedure Laterality Date  . CHOLECYSTECTOMY  2002  . CYSTOSCOPY W/ URETERAL STENT PLACEMENT Right 02/12/2014   Procedure: CYSTOSCOPY WITH RETROGRADE PYELOGRAM/URETERAL STENT PLACEMENT;  Surgeon: Festus Aloe, MD;  Location: [facility];  Service: Urology;  Laterality: Right;  . CYSTOSCOPY WITH RETROGRADE PYELOGRAM, URETEROSCOPY AND STENT PLACEMENT Right 02/12/2014   Procedure: RIGHT URETEROSCOPY ;  Surgeon: Festus Aloe, MD;  Location: [facility];  Service: Urology;  Laterality: Right;  . CYSTOSCOPY WITH URETEROSCOPY AND STENT PLACEMENT Right 03/05/2014   Procedure: CYSTOSCOPY WITH URETEROSCOPY AND STENT EXCHANGE, STONE EXTRACTION;  Surgeon: Festus Aloe, MD;  Location: [facility];  Service: Urology;  Laterality: Right;  . CYSTOSCOPY WITH URETEROSCOPY AND STENT PLACEMENT Right 03/26/2014   Procedure: CYSTO WITH RIGHT URETEROSCOPY/STENT PLACEMENT;  Surgeon: Festus Aloe, MD;  Location: [facility];  Service: Urology;  Laterality: Right;  . EXTRACORPOREAL SHOCK WAVE LITHOTRIPSY Right 12-17-2013  . HOLMIUM LASER APPLICATION Right [id number]   Procedure: HOLMIUM LASER APPLICATION;  Surgeon: Festus Aloe, MD;  Location[facility];  Service: Urology;  Laterality: Right;  . INGUINAL HERNIA REPAIR Bilateral right 2003/   left 2004  . STONE EXTRACTION WITH BASKET Right 03/26/2014   Procedure: STONE EXTRACTION WITH BASKET;  Surgeon: Festus Aloe, MD;  Location: [facility];  Service: Urology;  Laterality: Right;  . 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 UMBILICAL HERNIA REPAIR  2000    reports that he quit smoking about 45 years ago. His smoking use included Cigarettes. He has a 6.00 pack-year smoking history. He has quit using smokeless tobacco. His smokeless tobacco use included Chew. He reports that he drinks about 4.2 oz of alcohol per week . He reports that he does not use drugs. family history includes Cancer in his father and sister. No Known Allergies   Review of Systems  Constitutional: Negative for fatigue.  Eyes: Negative for visual disturbance.  Respiratory: Negative for cough, chest tightness and shortness of breath.   Cardiovascular: Negative for chest pain, palpitations and leg swelling.  Gastrointestinal: Negative for abdominal pain.  Endocrine: Negative for polydipsia and polyuria.  Musculoskeletal: Positive for arthralgias.  Neurological: Negative for dizziness, syncope, weakness, light-headedness and headaches.  Objective:   Physical Exam  Constitutional: He is oriented to person, place, and time. He appears well-developed and well-nourished.  HENT:  Right Ear: External  ear normal.  Left Ear: External ear normal.  Mouth/Throat: Oropharynx is clear and moist. No oropharyngeal exudate.  Neck: Neck supple. No thyromegaly present.  Cardiovascular: Normal rate and regular rhythm.  Exam reveals no gallop.   No murmur heard. Pulmonary/Chest: Breath sounds normal. No respiratory distress. He has no wheezes. He has no rales.  Abdominal: Soft. He exhibits no mass. There is no tenderness. There is no rebound and no guarding.  Musculoskeletal: He exhibits no edema.  Lymphadenopathy:    He has no cervical adenopathy.  Neurological: He is alert and oriented to person, place, and time. No cranial nerve deficit.  Skin: No rash noted.  Psychiatric: He has a normal mood and affect. His behavior is normal.       Assessment:     #1 osteoarthritis with anticipated right total knee replacement April  #2 dyslipidemia  #3 hypertension stable and at goal  #4 gout  #5 erectile dysfunction    Plan:     -Obtain labs with lipid panel, basic metabolic panel, hepatic panel -The natural history of prostate cancer and ongoing controversy regarding screening and potential treatment outcomes of prostate cancer has been discussed with the patient. The meaning of a false positive PSA and a false negative PSA has been discussed. He indicates understanding of the limitations of this screening test and wishes to proceed with screening PSA testing.  [name] MD [facility]

## 2016-08-10 DIAGNOSIS — B349 Viral infection, unspecified: Secondary | ICD-10-CM | POA: Diagnosis not present

## 2016-08-18 ENCOUNTER — Other Ambulatory Visit: Payer: Self-pay | Admitting: Family Medicine

## 2016-08-19 ENCOUNTER — Encounter: Payer: Self-pay | Admitting: Family Medicine

## 2016-08-20 MED ORDER — ALBUTEROL SULFATE HFA 108 (90 BASE) MCG/ACT IN AERS: 2.0000 | INHALATION_SPRAY | Freq: Four times a day (QID) | RESPIRATORY_TRACT | 0 refills | Status: DC | PRN

## 2016-08-20 NOTE — Telephone Encounter (Signed)
Medication sent in for patient. 

## 2016-09-10 ENCOUNTER — Other Ambulatory Visit: Payer: Self-pay | Admitting: Family Medicine

## 2016-10-14 DIAGNOSIS — Z87891 Personal history of nicotine dependence: Secondary | ICD-10-CM | POA: Diagnosis not present

## 2016-10-14 DIAGNOSIS — R936 Abnormal findings on diagnostic imaging of limbs: Secondary | ICD-10-CM | POA: Diagnosis not present

## 2016-10-14 DIAGNOSIS — I10 Essential (primary) hypertension: Secondary | ICD-10-CM | POA: Diagnosis not present

## 2016-10-14 DIAGNOSIS — Z79899 Other long term (current) drug therapy: Secondary | ICD-10-CM | POA: Diagnosis not present

## 2016-10-14 DIAGNOSIS — M1711 Unilateral primary osteoarthritis, right knee: Secondary | ICD-10-CM | POA: Diagnosis not present

## 2016-10-14 DIAGNOSIS — Z7982 Long term (current) use of aspirin: Secondary | ICD-10-CM | POA: Diagnosis not present

## 2016-10-14 DIAGNOSIS — R799 Abnormal finding of blood chemistry, unspecified: Secondary | ICD-10-CM | POA: Diagnosis not present

## 2016-10-28 ENCOUNTER — Ambulatory Visit: Payer: Medicare Other | Attending: Adult Reconstructive Orthopaedic Surgery

## 2016-10-28 DIAGNOSIS — M6281 Muscle weakness (generalized): Secondary | ICD-10-CM | POA: Diagnosis not present

## 2016-10-28 DIAGNOSIS — R2689 Other abnormalities of gait and mobility: Secondary | ICD-10-CM | POA: Insufficient documentation

## 2016-10-28 DIAGNOSIS — M25561 Pain in right knee: Secondary | ICD-10-CM | POA: Diagnosis not present

## 2016-10-28 DIAGNOSIS — M25661 Stiffness of right knee, not elsewhere classified: Secondary | ICD-10-CM | POA: Diagnosis not present

## 2016-10-28 NOTE — Patient Instructions (Addendum)
     Quad Set   With other leg bent, foot flat, slowly tighten muscles on thigh of straight leg while counting out loud to _5___. Repeat _10-20___ times. Do __5Hip Flexion / Knee Extension: Straight-Leg Raise (Eccentric)   Lie on back. Lift leg with knee straight. Slowly lower leg for 3-5 seconds. ___10 reps per set, 20__ sets per day, _7__ days per week. Lower like elevator, stopping at each floor. Heel Slides   Use a strap or towel to Slide left heel along bed towards bottom. Hold for _5__ seconds. Perform gently. Slide back to flat knee position. Repeat 10___ times. Do _3__ times a day.      ANKLE: Pumps     Abduction    Slide one leg out to side. Keep kneecap pointing up. Gently bring leg back to pillow. Repeat with other leg. Repeat __10__ times.     [facility] [address], [place], [place] [address] Phone # [phone] Fax [phone]

## 2016-10-28 NOTE — Therapy (Signed)
[facility] [address] [place], [place], [address] Phone: [phone]   Fax:  [phone]  Physical Therapy Treatment  Patient Details  Name: Lee Smith MRN: [id number] Date of Birth: [birth year] Referring Provider: Penelope Coop  Encounter Date: 10/28/2016      PT End of Session - 10/28/16 0913    Visit Number 1   Number of Visits 10   Date for PT Re-Evaluation 12/23/16   PT Start Time 0839   PT Stop Time 0910   PT Time Calculation (min) 31 min   Activity Tolerance Patient tolerated treatment well   Behavior During Therapy [facility] for tasks assessed/performed      Past Medical History:  Diagnosis Date  . Allergic rhinitis   . Arthritis    bilateral knees; has planned partial knee replacement next month  . Genital warts   . History of kidney stones 11/2013   lithotripsy for right ureteral stone  . Hx of colonic polyps    2008  . Hyperlipidemia   . Hypertension   . Right nephrolithiasis     Past Surgical History:  Procedure Laterality Date  . CHOLECYSTECTOMY  2002  . CYSTOSCOPY W/ URETERAL STENT PLACEMENT Right 02/12/2014   Procedure: CYSTOSCOPY WITH RETROGRADE PYELOGRAM/URETERAL STENT PLACEMENT;  Surgeon: Festus Aloe, MD;  Location: [facility];  Service: Urology;  Laterality: Right;  . CYSTOSCOPY WITH RETROGRADE PYELOGRAM, URETEROSCOPY AND STENT PLACEMENT Right 02/12/2014   Procedure: RIGHT URETEROSCOPY ;  Surgeon: Festus Aloe, MD;  [facility]: [facility];  Service: Urology;  Laterality: Right;  . CYSTOSCOPY WITH URETEROSCOPY AND STENT PLACEMENT Right 03/05/2014   Procedure: CYSTOSCOPY WITH URETEROSCOPY AND STENT EXCHANGE, STONE EXTRACTION;  Surgeon: Festus Aloe, MD;  Location: [facility];  Service: Urology;  Laterality: Right;  . CYSTOSCOPY WITH URETEROSCOPY AND STENT PLACEMENT Right 03/26/2014   Procedure: CYSTO WITH RIGHT  URETEROSCOPY/STENT PLACEMENT;  Surgeon: Festus Aloe, MD;  Location: [facility];  Service: Urology;  Laterality: Right;  . EXTRACORPOREAL SHOCK WAVE LITHOTRIPSY Right 12-17-2013  . HOLMIUM LASER APPLICATION Right 6/96/2952   Procedure: HOLMIUM LASER APPLICATION;  Surgeon: Festus Aloe, MD;  Location: [facility];  Service: Urology;  Laterality: Right;  . INGUINAL HERNIA REPAIR Bilateral right 2003/   left 2004  . STONE EXTRACTION WITH BASKET Right 03/26/2014   Procedure: STONE EXTRACTION WITH BASKET;  Surgeon: Festus Aloe, MD;  Location[facility];  Service: Urology;  Laterality: Right;  . 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 UMBILICAL HERNIA REPAIR  2000    There were no vitals filed for this visit.      Subjective Assessment - 10/28/16 0840    Subjective Pt presents today for pre-op visit before Rt knee replacement scheduled for 11/02/16.  Pt will return after surgery for treatment.  Today's visit is for pre-op measurements and to issue HEP for after surgery.     Pertinent History Rt TKA 11/02/16, Lt TKA 02/2015   Patient Stated Goals pre-op for total knee,  After surgery: improve A/ROM, strength, reduce edema, improve gait   Currently in Pain? Yes   Pain Score 4    Pain Location Knee   Pain Orientation Right   Pain Type Chronic pain   Pain Onset More than a month ago   Pain Frequency Intermittent   Aggravating Factors  sitting too long, standing/walking   Pain Relieving Factors stretching it out  [facility] PT Assessment - 10/28/16 0001      Assessment   Medical Diagnosis primary OA of Rt knee   Referring Provider Penelope Coop   Onset Date/Surgical Date 10/29/14   Next MD Visit 11/02/16  for Rt TKA   Prior Therapy for Lt knee at this clinic after TKA     Precautions   Precautions None     Restrictions   Weight Bearing Restrictions No     Balance Screen   Has the patient fallen in the past  6 months No   Has the patient had a decrease in activity level because of a fear of falling?  No   Is the patient reluctant to leave their home because of a fear of falling?  No     Home Environment   Living Environment Private residence   Living Arrangements Spouse/significant other   Type of [place] to enter   Entrance Stairs-Number of Steps 1   [place] to live on main level with bedroom/bathroom;Multi-level   Home Equipment Walker - 2 wheels;Cane - single point;Tub bench     Prior Function   Level of Independence Independent   Vocation Retired   Leisure travel, ride Health visitor   Overall Cognitive Status Within Functional Limits for tasks assessed     ROM / Strength   AROM / PROM / Strength AROM;PROM;Strength     AROM   Overall AROM  Deficits   AROM Assessment Site Knee   Right/Left Knee Right;Left   Right Knee Extension 10   Right Knee Flexion 120   Left Knee Extension 3   Left Knee Flexion 120     PROM   Overall PROM  Within functional limits for tasks performed     Strength   Overall Strength Deficits   Strength Assessment Site Hip;Knee   Right/Left Hip Right;Left   Right Hip Flexion 4+/5   Right Hip ABduction 4/5   Left Hip Flexion 4+/5   Left Hip ABduction 4+/5   Right/Left Knee Right;Left   Right Knee Flexion 4+/5   Right Knee Extension 4+/5   Left Knee Flexion 5/5   Left Knee Extension 5/5     Palpation   Patella mobility reduced patellar mobility in all directions on Rt with crepitus   Palpation comment no edema.  Tender over medial joint line     Transfers   Transfers Sit to Stand;Stand to Sit   Sit to Stand 7: Independent   Stand to Sit 7: Independent     Ambulation/Gait   Ambulation/Gait Yes   Ambulation/Gait Assistance 7: Independent   Gait Pattern Decreased stance time - right;Step-through pattern;Antalgic                             PT Education - 10/28/16 0904     Education provided Yes   Education Details HEP for post-op   Person(s) Educated Patient   Methods Explanation;Demonstration   Comprehension Verbalized understanding;Returned demonstration          PT Short Term Goals - 10/28/16 0918      PT SHORT TERM GOAL #1   Title be independent in initial HEP   Time 4   Period Weeks   Status New     PT SHORT TERM GOAL #2   Title 88     PT SHORT TERM GOAL #3   Title **  PT SHORT TERM GOAL #4   Title **           PT Long Term Goals - 2016/11/27 1610      PT LONG TERM GOAL #1   Title be independendent in advanced HEP   Time 8   Period Weeks   Status New     PT LONG TERM GOAL #2   Title **     PT LONG TERM GOAL #3   Title **     PT LONG TERM GOAL #4   Title **     PT LONG TERM GOAL #5   Title **               Plan - 2016/11/27 0914    Clinical Impression Statement Pt presents to PT 5 days pre-op for Rt TKA due to OA.  MD requests pre-op visit for measurements and initial HEP instruction.  Pt is a low complexity evaluation due to lack of comorbidities.  Pt demonstrates Rt knee A/ROM lacking 10 degrees extension and 120 degrees of flexion.  Pt with reduced Rt LE strength vs the Lt and demosntrates antalgic gait without device.  Pt will return to PT after surgery for reassessment and to set goals based on status after surgery.     Rehab Potential Good   PT Frequency 2x / week   PT Duration 8 weeks   PT Treatment/Interventions ADLs/Self Care Home Management;Cryotherapy;Electrical Stimulation;Functional mobility training;Stair training;Gait training;Ultrasound;Moist Heat;Therapeutic activities;Therapeutic exercise;Balance training;Neuromuscular re-education;Patient/family education;Passive range of motion;Scar mobilization;Manual techniques;Taping;Vasopneumatic Device   PT Next Visit Plan ERO next session after surgery.  FOTO, measurements, update plan and set goals based on status after surgery.  Send re-cert to MD with  updated goals.    Consulted and Agree with Plan of Care Patient      Patient will benefit from skilled therapeutic intervention in order to improve the following deficits and impairments:  Abnormal gait, Decreased strength, Impaired flexibility, Pain, Decreased activity tolerance, Decreased endurance, Difficulty walking, Decreased range of motion  Visit Diagnosis: Stiffness of right knee, not elsewhere classified - Plan: PT plan of care cert/re-cert  Muscle weakness (generalized) - Plan: PT plan of care cert/re-cert  Other abnormalities of gait and mobility - Plan: PT plan of care cert/re-cert  Right knee pain, unspecified chronicity - Plan: PT plan of care cert/re-cert       G-Codes - Nov 27, 2016 9604    Functional Assessment Tool Used (Outpatient Only) clinical judgement   Functional Limitation Mobility: Walking and moving around   Mobility: Walking and Moving Around Current Status ([id number]) At least 20 percent but less than 40 percent impaired, limited or restricted   Mobility: Walking and Moving Around Goal Status [id number]) At least 20 percent but less than 40 percent impaired, limited or restricted      Problem List Patient Active Problem List   Diagnosis Date Noted  . Adjustment disorder with depressed mood 05/30/2013  . Obesity (BMI 30-39.9) 04/23/2013  . RBBB 10/10/2012  . CONSTIPATION 07/28/2010  . LATERAL EPICONDYLITIS 01/12/2010  . LUMBAGO 06/23/2009  . ACTINIC KERATOSIS 03/19/2009  . CATARACT EXTRACTION STATUS 03/19/2009  . Hyperlipidemia 11/01/2008  . 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 UNSPECIFIED HEARING LOSS 11/01/2008  . Essential hypertension 11/01/2008  . ROTATOR CUFF SYNDROME, 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     Sigurd Sos, PT 11-27-16 9:22 AM  [facility] [address], [place], [place], [address] Phone: [phone]   Fax:  [phone]  Name: Lee Smith  MRN: [id number] Date of Birth: [birth year]

## 2016-11-02 DIAGNOSIS — G8918 Other acute postprocedural pain: Secondary | ICD-10-CM | POA: Diagnosis not present

## 2016-11-02 DIAGNOSIS — Z96651 Presence of right artificial knee joint: Secondary | ICD-10-CM | POA: Diagnosis not present

## 2016-11-02 DIAGNOSIS — Z87891 Personal history of nicotine dependence: Secondary | ICD-10-CM | POA: Diagnosis not present

## 2016-11-02 DIAGNOSIS — Z9049 Acquired absence of other specified parts of digestive tract: Secondary | ICD-10-CM | POA: Diagnosis not present

## 2016-11-02 DIAGNOSIS — J309 Allergic rhinitis, unspecified: Secondary | ICD-10-CM | POA: Diagnosis present

## 2016-11-02 DIAGNOSIS — M25561 Pain in right knee: Secondary | ICD-10-CM | POA: Diagnosis not present

## 2016-11-02 DIAGNOSIS — E785 Hyperlipidemia, unspecified: Secondary | ICD-10-CM | POA: Diagnosis present

## 2016-11-02 DIAGNOSIS — N529 Male erectile dysfunction, unspecified: Secondary | ICD-10-CM | POA: Diagnosis present

## 2016-11-02 DIAGNOSIS — M1711 Unilateral primary osteoarthritis, right knee: Secondary | ICD-10-CM | POA: Diagnosis not present

## 2016-11-02 DIAGNOSIS — Z7902 Long term (current) use of antithrombotics/antiplatelets: Secondary | ICD-10-CM | POA: Diagnosis not present

## 2016-11-02 DIAGNOSIS — Z471 Aftercare following joint replacement surgery: Secondary | ICD-10-CM | POA: Diagnosis not present

## 2016-11-02 DIAGNOSIS — Z96652 Presence of left artificial knee joint: Secondary | ICD-10-CM | POA: Diagnosis present

## 2016-11-02 DIAGNOSIS — J45909 Unspecified asthma, uncomplicated: Secondary | ICD-10-CM | POA: Diagnosis not present

## 2016-11-02 DIAGNOSIS — G4733 Obstructive sleep apnea (adult) (pediatric): Secondary | ICD-10-CM | POA: Diagnosis not present

## 2016-11-02 DIAGNOSIS — I10 Essential (primary) hypertension: Secondary | ICD-10-CM | POA: Diagnosis not present

## 2016-11-05 ENCOUNTER — Ambulatory Visit: Payer: Medicare Other | Admitting: Physical Therapy

## 2016-11-05 ENCOUNTER — Encounter: Payer: Self-pay | Admitting: Physical Therapy

## 2016-11-05 DIAGNOSIS — R600 Localized edema: Secondary | ICD-10-CM

## 2016-11-05 DIAGNOSIS — M6281 Muscle weakness (generalized): Secondary | ICD-10-CM

## 2016-11-05 DIAGNOSIS — R2689 Other abnormalities of gait and mobility: Secondary | ICD-10-CM | POA: Diagnosis not present

## 2016-11-05 DIAGNOSIS — M25661 Stiffness of right knee, not elsewhere classified: Secondary | ICD-10-CM

## 2016-11-05 DIAGNOSIS — M25561 Pain in right knee: Secondary | ICD-10-CM

## 2016-11-05 NOTE — Therapy (Addendum)
[facility] [address], [place], [place], [address] Phone: [phone]   Fax:  [phone]  Physical Therapy Treatment  Patient Details  Name: Lee Smith MRN: [id number] Date of Birth: [birth year] Referring Provider: Penelope Coop  Encounter Date: 11/05/2016      PT End of Session - 11/05/16 1039    Visit Number 2   Number of Visits 11   Date for PT Re-Evaluation 12/31/16   Authorization Type medicare g-code 11th visit; kx on 15th visit   PT Start Time 0930   PT Stop Time 1050   PT Time Calculation (min) 80 min   Activity Tolerance Patient tolerated treatment well   Behavior During Therapy [facility] for tasks assessed/performed      Past Medical History:  Diagnosis Date  . Allergic rhinitis   . Arthritis    bilateral knees; has planned partial knee replacement next month  . Genital warts   . History of kidney stones 11/2013   lithotripsy for right ureteral stone  . Hx of colonic polyps    2008  . Hyperlipidemia   . Hypertension   . Right nephrolithiasis     Past Surgical History:  Procedure Laterality Date  . CHOLECYSTECTOMY  2002  . CYSTOSCOPY W/ URETERAL STENT PLACEMENT Right 02/12/2014   Procedure: CYSTOSCOPY WITH RETROGRADE PYELOGRAM/URETERAL STENT PLACEMENT;  Surgeon: Festus Aloe, MD;  Location: [facility];  Service: Urology;  Laterality: Right;  . CYSTOSCOPY WITH RETROGRADE PYELOGRAM, URETEROSCOPY AND STENT PLACEMENT Right 02/12/2014   Procedure: RIGHT URETEROSCOPY ;  Surgeon: Festus Aloe, MD;  Location: [facility];  Service: Urology;  Laterality: Right;  . CYSTOSCOPY WITH URETEROSCOPY AND STENT PLACEMENT Right 03/05/2014   Procedure: CYSTOSCOPY WITH URETEROSCOPY AND STENT EXCHANGE, STONE EXTRACTION;  Surgeon: Festus Aloe, MD;  Location: [facility];  Service: Urology;  Laterality: Right;  . CYSTOSCOPY WITH URETEROSCOPY AND STENT  PLACEMENT Right 03/26/2014   Procedure: CYSTO WITH RIGHT URETEROSCOPY/STENT PLACEMENT;  Surgeon: Festus Aloe, MD;  Location: [facility];  Service: Urology;  Laterality: Right;  . EXTRACORPOREAL SHOCK WAVE LITHOTRIPSY Right 12-17-2013  . HOLMIUM LASER APPLICATION Right 5/80/9983   Procedure: HOLMIUM LASER APPLICATION;  Surgeon: Festus Aloe, MD;  Location: [facility]  Service: Urology;  Laterality: Right;  . INGUINAL HERNIA REPAIR Bilateral right 2003/   left 2004  . STONE EXTRACTION WITH BASKET Right 03/26/2014   Procedure: STONE EXTRACTION WITH BASKET;  Surgeon: Festus Aloe, MD;  Location: [facility];  Service: Urology;  Laterality: Right;  . 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 UMBILICAL HERNIA REPAIR  2000    There were no vitals filed for this visit.      Subjective Assessment - 11/05/16 0933    Subjective I had a total knee surgery on the right on 11/02/2016.  He was in the hospital for 1 days.  Patient was discharged from the hospital 11/03/2016.    Pertinent History Rt TKA 11/02/16, Lt TKA 02/2015   Patient Stated Goals pre-op for total knee,  After surgery: improve A/ROM, strength, reduce edema, improve gait   Currently in Pain? Yes   Pain Score 7   low pain i s1/10   Pain Location Knee   Pain Orientation Right   Pain Descriptors / Indicators Aching;Sharp   Pain Type Acute pain   Pain Onset In the past 7 days   Pain Frequency Intermittent   Aggravating Factors  movement,  standing, walking   Pain Relieving Factors stretch, ice   Multiple Pain Sites No            [facility] PT Assessment - 11/05/16 0001      Assessment   Medical Diagnosis Right total knee replacement   Referring Provider Penelope Coop   Onset Date/Surgical Date 11/02/16   Prior Therapy at the hospital     Precautions   Precautions None     Restrictions   Weight Bearing Restrictions No     Balance Screen   Has the patient fallen  in the past 6 months No   Has the patient had a decrease in activity level because of a fear of falling?  No   Is the patient reluctant to leave their home because of a fear of falling?  No     Home Environment   Living Environment Private residence   Living Arrangements Spouse/significant other   Type of [place] to enter   Entrance Stairs-Number of Steps 1   [place] to live on main level with bedroom/bathroom;Multi-level   Home Equipment Walker - 2 wheels;Cane - single point;Tub bench     Prior Function   Level of Independence Independent   Vocation Retired   Leisure travel, ride Health visitor   Overall Cognitive Status Within Functional Limits for tasks assessed     Observation/Other Assessments   Skin Integrity skin aroun the right knee was red and warm, bandage over the surgical site   Focus on Therapeutic Outcomes (FOTO)  65% limitation  goal is 47% limitation     Observation/Other Assessments-Edema    Edema Circumferential     Circumferential Edema   Circumferential - Right around right knee joint 46.5cm   Circumferential - Left  41.5 cm     Posture/Postural Control   Posture/Postural Control No significant limitations     ROM / Strength   AROM / PROM / Strength AROM;PROM;Strength     AROM   Right Knee Extension -25   Right Knee Flexion 80     PROM   Right/Left Knee Right   Right Knee Extension -10   Right Knee Flexion 100     Strength   Right Hip Flexion 4/5   Right Hip Extension 4/5   Right Hip ABduction 4/5   Right Knee Extension 2-/5     Palpation   Patella mobility decreased right patella mobility   Palpation comment tightness in right calf     Ambulation/Gait   Ambulation/Gait Yes   Ambulation/Gait Assistance 6: Modified independent (Device/Increase time)   Assistive device Rolling walker   Gait Pattern Decreased stride length;Decreased stance time - right;Decreased step length - right;Decreased hip/knee  flexion - right;Decreased dorsiflexion - right;Decreased weight shift to right   Stairs No                     [facility] Adult PT Treatment/Exercise - 11/05/16 0001      Exercises   Exercises Knee/Hip     Knee/Hip Exercises: Stretches   Active Hamstring Stretch Left;1 rep;30 seconds     Knee/Hip Exercises: Aerobic   Stationary Bike 10 min going back and forth at his tolerance  end patient was able to go all the way around     Knee/Hip Exercises: Seated   Long Arc [employer];Right;1 set;15 reps     Knee/Hip Exercises: Supine   Quad Sets Strengthening;Right;1 set;10 reps  hold 5 sec   Heel Slides AAROM;Right;2 sets;15 reps   Straight Leg Raises AAROM;Strengthening;Right;2 sets;10 reps     Modalities   Modalities Vasopneumatic     Vasopneumatic   Number Minutes Vasopneumatic  15 minutes   Vasopnuematic Location  Knee  right   Vasopneumatic Pressure Medium   Vasopneumatic Temperature  3 snowflakes     Manual Therapy   Manual Therapy Soft tissue mobilization;Manual Lymphatic Drainage (MLD)   Soft tissue mobilization to right calf   Manual Lymphatic Drainage (MLD) to right knee to reduce swelling and redness                PT Education - 11/05/16 1037    Education provided Yes   Education Details quad sets; LAQ, SAQ, SLR; heel slide, calf stretch   Person(s) Educated Patient   Methods Explanation;Demonstration;Verbal cues;Handout   Comprehension Returned demonstration;Verbalized understanding          PT Short Term Goals - 11/05/16 1048      PT SHORT TERM GOAL #1   Title be independent in initial HEP   Time 4   Period Weeks   Status New     PT SHORT TERM GOAL #2   Title right knee flexion PROM >/= 110 degrees due to increased mobility   Time 4   Period Weeks   Status New     PT SHORT TERM GOAL #3   Title ambulate with a single point cane due to increased right knee strength >/= 3/5   Time 4   Period Weeks   Status New     PT SHORT  TERM GOAL #4   Title pain in right knee with walking decreased >/= 25%   Time 4   Period Weeks   Status New     PT SHORT TERM GOAL #5   Title right knee extension >/= -5 degrees making it easier to walk   Time 4   Period Weeks   Status New           PT Long Term Goals - 11/05/16 1051      PT LONG TERM GOAL #1   Title be independendent in advanced HEP   Time 8   Period Weeks   Status New     PT LONG TERM GOAL #2   Title able to ambulate without an assistive device due to increased right knee strength >/= 4/5   Time 8   Period Weeks   Status New     PT LONG TERM GOAL #3   Title able to go up steps with step over step patter due to increased right knee flexion >/= 115 degrees   Time 8   Period Weeks   Status New     PT LONG TERM GOAL #4   Title right knee extension AROM >/= -5 degrees so he is able to stand to perform daily activities without difficulty   Time 8   Period Weeks   Status New     PT LONG TERM GOAL #5   Title FOTO score </= 47% limitaiton   Time 8   Period Weeks   Status New               Plan - 11/05/16 1041    Clinical Impression Statement Patient had right TKR on 11/02/2016 and discharged on 11/03/2016. Patient reports his right knee pain ranges from 1-8/10 depending on what he is doing. Patient ambulates with a walker.  He walks with  decreased right knee extension and flexion, decreased weight on right , decreased ankle dorsifleixon, and decreased step length on the right. Patient right knee has swelling with redness and warmth.  He is wearing a bandage over the surgical site that is to stay on for 1 and 1/2 weeks. Right knee AROM is -20 to 80 and PROM is -10 to 100 degrees.  Patient has decreased strength in right hip, knee and ankle plantarflexion.  Patient has edema in right knee.  Patient is low complexity evaluation deu to an evolving condition and no comorbidities that will impact care provided.    Rehab Potential Excellent   Clinical  Impairments Affecting Rehab Potential Right TKR on 11/02/2016   PT Frequency 2x / week   PT Duration 8 weeks   PT Treatment/Interventions ADLs/Self Care Home Management;Cryotherapy;Electrical Stimulation;Functional mobility training;Stair training;Gait training;Ultrasound;Moist Heat;Therapeutic activities;Therapeutic exercise;Balance training;Neuromuscular re-education;Patient/family education;Passive range of motion;Scar mobilization;Manual techniques;Taping;Vasopneumatic Device   PT Next Visit Plan Right knee ROM exercises, recumbent bike, right knee, ankle and hip strength, gait training, soft tissue work for edema, vasopnuematic device   PT Home Exercise Plan progress as needed   Recommended Other Services None   Consulted and Agree with Plan of Care Patient      Patient will benefit from skilled therapeutic intervention in order to improve the following deficits and impairments:  Abnormal gait, Decreased strength, Impaired flexibility, Pain, Decreased activity tolerance, Decreased endurance, Difficulty walking, Decreased range of motion, Increased edema, Decreased scar mobility, Decreased mobility  Visit Diagnosis: Stiffness of right knee, not elsewhere classified - Plan: PT plan of care cert/re-cert  Muscle weakness (generalized) - Plan: PT plan of care cert/re-cert  Other abnormalities of gait and mobility - Plan: PT plan of care cert/re-cert  Acute pain of right knee - Plan: PT plan of care cert/re-cert  Edema localized-Plan: PT plan of care cert/re-cert     G-Codes - 07/37/10 1053    Functional Assessment Tool Used (Outpatient Only) FOTO score is 65% limitation after surgery  goal is 47% limitation    Functional Limitation Mobility: Walking and moving around   Mobility: Walking and Moving Around Current Status (G2694) At least 60 percent but less than 80 percent impaired, limited or restricted   Mobility: Walking and Moving Around Goal Status (W5462) At least 40 percent but less  than 60 percent impaired, limited or restricted      Problem List Patient Active Problem List   Diagnosis Date Noted  . Adjustment disorder with depressed mood 05/30/2013  . Obesity (BMI 30-39.9) 04/23/2013  . RBBB 10/10/2012  . CONSTIPATION 07/28/2010  . LATERAL EPICONDYLITIS 01/12/2010  . LUMBAGO 06/23/2009  . ACTINIC KERATOSIS 03/19/2009  . CATARACT EXTRACTION STATUS 03/19/2009  . Hyperlipidemia 11/01/2008  . 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 UNSPECIFIED HEARING LOSS 11/01/2008  . Essential hypertension 11/01/2008  . ROTATOR CUFF SYNDROME, 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    Earlie Counts, PT 11/05/16 11:01 AM   [facility] [address], [place], [place], [address] Phone: [phone]   Fax:  [phone]  Name: BLANCHE GALLIEN MRN: [id number] Date of Birth: 12/15/1945

## 2016-11-05 NOTE — Patient Instructions (Addendum)
Stretching: Calf - Towel    Sit with knee straight and towel looped around left foot. Gently pull on towel until stretch is felt in calf. Hold __30__ seconds. Repeat _2___ times per set. Do ___1_ sets per session. Do __2__ sessions per day.  http://orth.exer.us/706   Copyright  VHI. All rights reserved.  Short Arc [employer] a large can or rolled towel under left leg. Straighten leg. Hold _2___ seconds. Repeat _15___ times. Do _3___ sessions per day.  http://gt2.exer.us/298   Copyright  VHI. All rights reserved.  Hip Flexion / Knee Extension: Straight-Leg Raise (Eccentric)   Lie on back. Lift leg with knee straight. Slowly lower leg for 3-5 seconds. ___10 reps per set, 20__ sets per day, _7__ days per week. Lower like elevator, stopping at each floor. Heel Slides   Use a strap or towel to Slide left heel along bed towards bottom. Hold for _5__ seconds. Perform gently. Slide back to flat knee position. Repeat 10___ times. Do _3__ times a day.      ANKLE: Pumps    pump ankles up and down 10 time 4 times per day.  Long [employer]    Straighten operated leg and try to hold it __1__ seconds.  Repeat _15___ times. Do __3__ sessions a day.  http://gt2.exer.us/310   Copyright  VHI. All rights reserved.  [place] [address], [place], [place] [address] Phone # [phone] Fax [phone]

## 2016-11-08 ENCOUNTER — Encounter: Payer: Self-pay | Admitting: Physical Therapy

## 2016-11-08 ENCOUNTER — Ambulatory Visit: Payer: Medicare Other | Admitting: Physical Therapy

## 2016-11-08 DIAGNOSIS — M25661 Stiffness of right knee, not elsewhere classified: Secondary | ICD-10-CM | POA: Diagnosis not present

## 2016-11-08 DIAGNOSIS — R600 Localized edema: Secondary | ICD-10-CM

## 2016-11-08 DIAGNOSIS — M25561 Pain in right knee: Secondary | ICD-10-CM | POA: Diagnosis not present

## 2016-11-08 DIAGNOSIS — M6281 Muscle weakness (generalized): Secondary | ICD-10-CM | POA: Diagnosis not present

## 2016-11-08 DIAGNOSIS — R2689 Other abnormalities of gait and mobility: Secondary | ICD-10-CM

## 2016-11-08 NOTE — Therapy (Addendum)
[facility] [address] [place], [place], [address] Phone: [phone]   Fax:  [phone]  Physical Therapy Treatment  Patient Details  Name: Lee Smith MRN: [id number] Date of Birth: 1946-04-30 Referring Provider: Penelope Coop  Encounter Date: 11/08/2016      PT End of Session - 11/08/16 0928    Visit Number 3   Number of Visits 11   Date for PT Re-Evaluation 12/31/16   Authorization Type medicare g-code 11th visit; kx on 15th visit   PT Start Time 0926   PT Stop Time 1015   PT Time Calculation (min) 49 min   Activity Tolerance Patient tolerated treatment well   Behavior During Therapy [facility] for tasks assessed/performed      Past Medical History:  Diagnosis Date  . Allergic rhinitis   . Arthritis    bilateral knees; has planned partial knee replacement next month  . Genital warts   . History of kidney stones 11/2013   lithotripsy for right ureteral stone  . Hx of colonic polyps    2008  . Hyperlipidemia   . Hypertension   . Right nephrolithiasis     Past Surgical History:  Procedure Laterality Date  . CHOLECYSTECTOMY  2002  . CYSTOSCOPY W/ URETERAL STENT PLACEMENT Right 02/12/2014   Procedure: CYSTOSCOPY WITH RETROGRADE PYELOGRAM/URETERAL STENT PLACEMENT;  Surgeon: Festus Aloe, MD;  Location: [facility];  Service: Urology;  Laterality: Right;  . CYSTOSCOPY WITH RETROGRADE PYELOGRAM, URETEROSCOPY AND STENT PLACEMENT Right 02/12/2014   Procedure: RIGHT URETEROSCOPY ;  Surgeon: Festus Aloe, MD;  Location: [facility];  Service: Urology;  Laterality: Right;  . CYSTOSCOPY WITH URETEROSCOPY AND STENT PLACEMENT Right 03/05/2014   Procedure: CYSTOSCOPY WITH URETEROSCOPY AND STENT EXCHANGE, STONE EXTRACTION;  Surgeon: Festus Aloe, MD;  Location: [facility];  Service: Urology;  Laterality: Right;  . CYSTOSCOPY WITH URETEROSCOPY AND STENT  PLACEMENT Right 03/26/2014   Procedure: CYSTO WITH RIGHT URETEROSCOPY/STENT PLACEMENT;  Surgeon: Festus Aloe, MD;  Location: [facility];  Service: Urology;  Laterality: Right;  . EXTRACORPOREAL SHOCK WAVE LITHOTRIPSY Right 12-17-2013  . HOLMIUM LASER APPLICATION Right 0/93/2671   Procedure: HOLMIUM LASER APPLICATION;  Surgeon: Festus Aloe, MD;  Location[facility];  Service: Urology;  Laterality: Right;  . INGUINAL HERNIA REPAIR Bilateral right 2003/   left 2004  . STONE EXTRACTION WITH BASKET Right 03/26/2014   Procedure: STONE EXTRACTION WITH BASKET;  Surgeon: Festus Aloe, MD;  Location: [facility];  Service: Urology;  Laterality: Right;  . 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 UMBILICAL HERNIA REPAIR  2000    There were no vitals filed for this visit.      Subjective Assessment - 11/08/16 0926    Subjective Pt reports pain is not bad when in right position. Minimal pain at the moment.   Pertinent History Rt TKA 11/02/16, Lt TKA 02/2015   Patient Stated Goals pre-op for total knee,  After surgery: improve A/ROM, strength, reduce edema, improve gait   Currently in Pain? Yes   Pain Score 2    Pain Location Knee   Pain Orientation Right   Pain Descriptors / Indicators Aching;Sharp   Pain Type Acute pain   Pain Onset In the past 7 days   Pain Frequency Intermittent   Aggravating Factors  movement, standing, walking   Pain Relieving Factors stretch, ice   Multiple Pain Sites No  [place] Adult PT Treatment/Exercise - 11/08/16 0001      Therapeutic Activites    Therapeutic Activities ADL's   ADL's Transfers, stairs, standing     Knee/Hip Exercises: Stretches   Active Hamstring Stretch Left;1 rep;30 seconds   Gastroc Stretch Both;2 reps;10 seconds     Knee/Hip Exercises: Aerobic   Stationary Bike 10 min going back and forth at his tolerance  Forward revolutions   Nustep L1 x 6  minutes  Therapist present to discuss treatment     Knee/Hip Exercises: Standing   Hip Flexion AROM;2 sets;10 reps   Hip Abduction AROM;Both;2 sets;10 reps   Hip Extension AROM;Both;2 sets;10 reps   Forward Step Up 2 sets;10 reps   Functional Squat --  Sit to stand x10     Knee/Hip Exercises: Seated   Long Arc Quad Right;1 set;15 reps;AROM     Knee/Hip Exercises: Supine   Short Arc Quad Sets AROM;1 set;15 reps;Right   Heel Slides AAROM;Right;2 sets;15 reps   Hip Adduction Isometric 20 reps   Straight Leg Raises AAROM;Strengthening;Right;2 sets;10 reps     Modalities   Modalities Vasopneumatic     Vasopneumatic   Number Minutes Vasopneumatic  10 minutes   Vasopnuematic Location  Knee  right   Vasopneumatic Pressure Medium   Vasopneumatic Temperature  3 snowflakes     Manual Therapy   Manual Therapy Soft tissue mobilization;Manual Lymphatic Drainage (MLD)   Soft tissue mobilization to right calf and hamstring, lateral to bandage   Manual Lymphatic Drainage (MLD) to right knee to reduce swelling and redness                  PT Short Term Goals - 11/08/16 7169      PT SHORT TERM GOAL #1   Title be independent in initial HEP   Time 4   Period Weeks   Status On-going     PT SHORT TERM GOAL #2   Title right knee flexion PROM >/= 110 degrees due to increased mobility   Time 4   Period Weeks   Status On-going     PT SHORT TERM GOAL #3   Title ambulate with a single point cane due to increased right knee strength >/= 3/5   Time 4   Period Weeks   Status On-going     PT SHORT TERM GOAL #4   Title pain in right knee with walking decreased >/= 25%   Time 4   Period Weeks   Status On-going     PT SHORT TERM GOAL #5   Title right knee extension >/= -5 degrees making it easier to walk   Time 4   Period Weeks   Status On-going           PT Long Term Goals - 11/08/16 0929      PT LONG TERM GOAL #1   Title be independendent in advanced HEP   Time 8    Period Weeks   Status On-going     PT LONG TERM GOAL #2   Title Smith to ambulate without an assistive device due to increased right knee strength >/= 4/5   Time 8   Period Weeks   Status On-going     PT LONG TERM GOAL #3   Title Smith to go up steps with step over step patter due to increased right knee flexion >/= 115 degrees   Time 8   Period Weeks   Status On-going     PT LONG  TERM GOAL #4   Title right knee extension AROM >/= -5 degrees so he is Smith to stand to perform daily activities without difficulty   Time 8   Period Weeks   Status On-going     PT LONG TERM GOAL #5   Title FOTO score </= 47% limitaiton   Time 8   Period Weeks   Status On-going               Plan - 11/08/16 1002    Clinical Impression Statement Pt presents with straight cane although not using it for ambulation. Pt reports minmal pain in knee but pain increases depending on position and activity. Pt did well with all standing exercises with no increase in pain. Pt having some difficulty with stairs due to weakness and limited ROM. Soft tissue mobilization to knee to decrease edema. Pt will continue to benefit from skilled therapy for knee stability and ROM.    Rehab Potential Excellent   Clinical Impairments Affecting Rehab Potential Right TKR on 11/02/2016   PT Frequency 2x / week   PT Duration 8 weeks   PT Treatment/Interventions ADLs/Self Care Home Management;Cryotherapy;Electrical Stimulation;Functional mobility training;Stair training;Gait training;Ultrasound;Moist Heat;Therapeutic activities;Therapeutic exercise;Balance training;Neuromuscular re-education;Patient/family education;Passive range of motion;Scar mobilization;Manual techniques;Taping;Vasopneumatic Device   PT Next Visit Plan Right knee ROM exercises, recumbent bike, right knee, ankle and hip strength, gait training, soft tissue work for edema, vasopnuematic device   Consulted and Agree with Plan of Care Patient      Patient  will benefit from skilled therapeutic intervention in order to improve the following deficits and impairments:  Abnormal gait, Decreased strength, Impaired flexibility, Pain, Decreased activity tolerance, Decreased endurance, Difficulty walking, Decreased range of motion, Increased edema, Decreased scar mobility, Decreased mobility  Visit Diagnosis: Stiffness of right knee, not elsewhere classified  Muscle weakness (generalized)  Other abnormalities of gait and mobility  Acute pain of right knee  Right knee pain, unspecified chronicity  Localized edema Earlie Counts, PT 12/02/16 10:58 AM     Problem List Patient Active Problem List   Diagnosis Date Noted  . Adjustment disorder with depressed mood 05/30/2013  . Obesity (BMI 30-39.9) 04/23/2013  . RBBB 10/10/2012  . CONSTIPATION 07/28/2010  . LATERAL EPICONDYLITIS 01/12/2010  . LUMBAGO 06/23/2009  . ACTINIC KERATOSIS 03/19/2009  . CATARACT EXTRACTION STATUS 03/19/2009  . Hyperlipidemia 11/01/2008  . 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 UNSPECIFIED HEARING LOSS 11/01/2008  . Essential hypertension 11/01/2008  . ROTATOR CUFF SYNDROME, 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    Mikle Bosworth PTA 11/08/2016, 10:18 AM Earlie Counts, PT 12/02/16 10:58 AM   [facility] [address] [place], [place], [address] Phone: [phone]   Fax:  [phone]  Name: Lee Smith MRN: [id number] Date of Birth: 04/15/1946

## 2016-11-11 ENCOUNTER — Ambulatory Visit: Payer: Medicare Other | Admitting: Physical Therapy

## 2016-11-11 DIAGNOSIS — M25661 Stiffness of right knee, not elsewhere classified: Secondary | ICD-10-CM

## 2016-11-11 DIAGNOSIS — R2689 Other abnormalities of gait and mobility: Secondary | ICD-10-CM | POA: Diagnosis not present

## 2016-11-11 DIAGNOSIS — M25561 Pain in right knee: Secondary | ICD-10-CM

## 2016-11-11 DIAGNOSIS — R600 Localized edema: Secondary | ICD-10-CM

## 2016-11-11 DIAGNOSIS — M6281 Muscle weakness (generalized): Secondary | ICD-10-CM

## 2016-11-11 NOTE — Therapy (Addendum)
[facility] [address], [name], [place], [address] Phone: [phone]   Fax:  [phone]  Physical Therapy Treatment  Patient Details  Name: Lee Smith MRN: [id number] Date of Birth: 1945/08/22 Referring Provider: Penelope Coop  Encounter Date: 11/11/2016      PT End of Session - 11/11/16 1048    Visit Number 4   Number of Visits 11   Date for PT Re-Evaluation 12/31/16   Authorization Type medicare g-code 11th visit; kx on 15th visit   PT Start Time 1013   PT Stop Time 1110   PT Time Calculation (min) 57 min   Activity Tolerance Patient tolerated treatment well      Past Medical History:  Diagnosis Date  . Allergic rhinitis   . Arthritis    bilateral knees; has planned partial knee replacement next month  . Genital warts   . History of kidney stones 11/2013   lithotripsy for right ureteral stone  . Hx of colonic polyps    2008  . Hyperlipidemia   . Hypertension   . Right nephrolithiasis     Past Surgical History:  Procedure Laterality Date  . CHOLECYSTECTOMY  2002  . CYSTOSCOPY W/ URETERAL STENT PLACEMENT Right 02/12/2014   Procedure: CYSTOSCOPY WITH RETROGRADE PYELOGRAM/URETERAL STENT PLACEMENT;  Surgeon: Festus Aloe, MD;  Location: [facility];  Service: Urology;  Laterality: Right;  . CYSTOSCOPY WITH RETROGRADE PYELOGRAM, URETEROSCOPY AND STENT PLACEMENT Right 02/12/2014   Procedure: RIGHT URETEROSCOPY ;  Surgeon: Festus Aloe, MD;  Location: [facility];  Service: Urology;  Laterality: Right;  . CYSTOSCOPY WITH URETEROSCOPY AND STENT PLACEMENT Right 03/05/2014   Procedure: CYSTOSCOPY WITH URETEROSCOPY AND STENT EXCHANGE, STONE EXTRACTION;  Surgeon: Festus Aloe, MD;  Location: [facility];  Service: Urology;  Laterality: Right;  . CYSTOSCOPY WITH URETEROSCOPY AND STENT PLACEMENT Right 03/26/2014   Procedure: CYSTO WITH RIGHT  URETEROSCOPY/STENT PLACEMENT;  Surgeon: Festus Aloe, MD;  Location: [facility];  Service: Urology;  Laterality: Right;  . EXTRACORPOREAL SHOCK WAVE LITHOTRIPSY Right 12-17-2013  . HOLMIUM LASER APPLICATION Right 2/75/1700   Procedure: HOLMIUM LASER APPLICATION;  Surgeon: Festus Aloe, MD;  Location: [facility];  Service: Urology;  Laterality: Right;  . INGUINAL HERNIA REPAIR Bilateral right 2003/   left 2004  . STONE EXTRACTION WITH BASKET Right 03/26/2014   Procedure: STONE EXTRACTION WITH BASKET;  Surgeon: Festus Aloe, MD;  Location: [facility];  Service: Urology;  Laterality: Right;  . 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 UMBILICAL HERNIA REPAIR  2000    There were no vitals filed for this visit.      Subjective Assessment - 11/11/16 1015    Subjective patient presents without assistive device;  states he is doing well although with bending his knee he has a lot of tightness;  first few steps when walking are stiff   Pertinent History Rt TKA 11/02/16, Lt TKA 02/2015   Currently in Pain? Yes   Pain Score 2    Pain Location Knee   Pain Orientation Right   Pain Type Surgical pain   Aggravating Factors  bending knee; first few steps   Pain Relieving Factors ice            [facility] PT Assessment - 11/11/16 0001      AROM   Right Knee Extension -19   Right Knee Flexion 116  [place] [facility] PT Treatment/Exercise - 11/11/16 0001      Therapeutic Activites    ADL's Transfers, stairs, standing     Neuro Re-ed    Neuro Re-ed Details  quad muscle activation      Knee/Hip Exercises: Stretches   Active Hamstring Stretch Right;3 reps;20 seconds  on steps     Knee/Hip Exercises: Aerobic   Stationary Bike 8 min full revolutions    Nustep L1 x 6 minutes  Therapist present to discuss treatment     Knee/Hip Exercises: Standing   Heel Raises 10 reps   Hip Abduction AROM;Both;2 sets;10 reps    Forward Step Up Right;10 reps;Hand Hold: 2;Step Height: 6"   Other Standing Knee Exercises step taps weight bear on right 10x   Other Standing Knee Exercises retro stepping 25x     Knee/Hip Exercises: Seated   Long Arc Quad Right;1 set;15 reps;AROM  with alternating ankle pump   Hamstring Curl Strengthening;Right;15 reps     Knee/Hip Exercises: Supine   Other Supine Knee/Hip Exercises green ball roll for flexion 20x   Other Supine Knee/Hip Exercises HS sets on green ball 10x     Vasopneumatic   Number Minutes Vasopneumatic  15 minutes   Vasopnuematic Location  Knee  right   Vasopneumatic Pressure Medium   Vasopneumatic Temperature  3 snowflakes     Manual Therapy   Manual Therapy Passive ROM;Other (comment)   Passive ROM seated and supine knee flexion   Other Manual Therapy grade 2 patellar mobs med/lateral, superior/inferior                  PT Short Term Goals - 11/11/16 1504      PT SHORT TERM GOAL #1   Title be independent in initial HEP   Time 4   Period Weeks   Status On-going     PT SHORT TERM GOAL #2   Title right knee flexion PROM >/= 110 degrees due to increased mobility   Status Achieved     PT SHORT TERM GOAL #3   Title ambulate with a single point cane due to increased right knee strength >/= 3/5   Status Achieved     PT SHORT TERM GOAL #4   Title pain in right knee with walking decreased >/= 25%   Time 4   Period Weeks   Status On-going     PT SHORT TERM GOAL #5   Title right knee extension >/= -5 degrees making it easier to walk   Time 4   Period Weeks   Status On-going           PT Long Term Goals - 11/11/16 1505      PT LONG TERM GOAL #1   Title be independendent in advanced HEP   Time 8   Period Weeks   Status On-going     PT LONG TERM GOAL #2   Title able to ambulate without an assistive device due to increased right knee strength >/= 4/5   Time 8   Period Weeks   Status On-going     PT LONG TERM GOAL #3    Title able to go up steps with step over step patter due to increased right knee flexion >/= 115 degrees   Time 8   Period Weeks   Status On-going     PT LONG TERM GOAL #4   Title right knee extension AROM >/= -5 degrees so he is able to stand to perform daily activities  without difficulty   Time 8   Period Weeks   Status On-going     PT LONG TERM GOAL #5   Title FOTO score </= 47% limitaiton   Time 8   Period Weeks   Status On-going               Plan - 11/11/16 1049    Clinical Impression Statement The patient is progressing well with knee ROM, much improved from last week,  19-116 degrees actively now.  Moderate swelling persists which inhibits quad muscle activation and extension ROM.  Extension lag.  Muscular fatigue with weight bearing ex's on surgical leg.  Progressing with STGs.  Continue with current interventions to address these deficits.      PT Treatment/Interventions ADLs/Self Care Home Management;Cryotherapy;Electrical Stimulation;Functional mobility training;Stair training;Gait training;Ultrasound;Moist Heat;Therapeutic activities;Therapeutic exercise;Balance training;Neuromuscular re-education;Patient/family education;Passive range of motion;Scar mobilization;Manual techniques;Taping;Vasopneumatic Device   PT Next Visit Plan Right knee ROM exercises, recumbent bike, right knee, ankle and hip strength, gait training, soft tissue work for edema, vasopnuematic device      Patient will benefit from skilled therapeutic intervention in order to improve the following deficits and impairments:  Abnormal gait, Decreased strength, Impaired flexibility, Pain, Decreased activity tolerance, Decreased endurance, Difficulty walking, Decreased range of motion, Increased edema, Decreased scar mobility, Decreased mobility  Visit Diagnosis: Stiffness of right knee, not elsewhere classified  Muscle weakness (generalized)  Other abnormalities of gait and mobility  Acute pain of  right knee  Localized edema Earlie Counts, PT 12/02/16 11:00 AM     Problem List Patient Active Problem List   Diagnosis Date Noted  . Adjustment disorder with depressed mood 05/30/2013  . Obesity (BMI 30-39.9) 04/23/2013  . RBBB 10/10/2012  . CONSTIPATION 07/28/2010  . LATERAL EPICONDYLITIS 01/12/2010  . LUMBAGO 06/23/2009  . ACTINIC KERATOSIS 03/19/2009  . CATARACT EXTRACTION STATUS 03/19/2009  . Hyperlipidemia 11/01/2008  . 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 UNSPECIFIED HEARING LOSS 11/01/2008  . Essential hypertension 11/01/2008  . ROTATOR CUFF SYNDROME, 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   Ruben Im, PT 11/11/16 3:07 PM Phone: [phone] Fax: [phone]  Alvera Singh 11/11/2016, 3:07 PM  [facility] [address], [place], [place], [address] Phone: [phone]   Fax:  [phone]  Name: Lee Smith MRN: [id number] Date of Birth: 07-Dec-1945

## 2016-11-15 ENCOUNTER — Ambulatory Visit: Payer: Medicare Other | Admitting: Physical Therapy

## 2016-11-15 ENCOUNTER — Encounter: Payer: Self-pay | Admitting: Physical Therapy

## 2016-11-15 DIAGNOSIS — M25561 Pain in right knee: Secondary | ICD-10-CM

## 2016-11-15 DIAGNOSIS — R2689 Other abnormalities of gait and mobility: Secondary | ICD-10-CM | POA: Diagnosis not present

## 2016-11-15 DIAGNOSIS — M6281 Muscle weakness (generalized): Secondary | ICD-10-CM | POA: Diagnosis not present

## 2016-11-15 DIAGNOSIS — R600 Localized edema: Secondary | ICD-10-CM

## 2016-11-15 DIAGNOSIS — M25661 Stiffness of right knee, not elsewhere classified: Secondary | ICD-10-CM

## 2016-11-15 NOTE — Therapy (Addendum)
[facility] [address] [place], [place], [address] Phone: [phone]   Fax:  [phone]  Physical Therapy Treatment  Patient Details  Name: Lee Smith MRN: [id number] Date of Birth: 10-01-1945 Referring Provider: Penelope Coop  Encounter Date: 11/15/2016      PT End of Session - 11/15/16 1017    Visit Number 5   Number of Visits 11   Date for PT Re-Evaluation 12/31/16   Authorization Type medicare g-code 11th visit; kx on 15th visit   PT Start Time 1015   PT Stop Time 1115   PT Time Calculation (min) 60 min   Activity Tolerance Patient tolerated treatment well   Behavior During Therapy [facility] for tasks assessed/performed      Past Medical History:  Diagnosis Date  . Allergic rhinitis   . Arthritis    bilateral knees; has planned partial knee replacement next month  . Genital warts   . History of kidney stones 11/2013   lithotripsy for right ureteral stone  . Hx of colonic polyps    2008  . Hyperlipidemia   . Hypertension   . Right nephrolithiasis     Past Surgical History:  Procedure Laterality Date  . CHOLECYSTECTOMY  2002  . CYSTOSCOPY W/ URETERAL STENT PLACEMENT Right 02/12/2014   Procedure: CYSTOSCOPY WITH RETROGRADE PYELOGRAM/URETERAL STENT PLACEMENT;  Surgeon: Festus Aloe, MD;  Location: [facility];  Service: Urology;  Laterality: Right;  . CYSTOSCOPY WITH RETROGRADE PYELOGRAM, URETEROSCOPY AND STENT PLACEMENT Right 02/12/2014   Procedure: RIGHT URETEROSCOPY ;  Surgeon: Festus Aloe, MD;  Location: [facility];  Service: Urology;  Laterality: Right;  . CYSTOSCOPY WITH URETEROSCOPY AND STENT PLACEMENT Right 03/05/2014   Procedure: CYSTOSCOPY WITH URETEROSCOPY AND STENT EXCHANGE, STONE EXTRACTION;  Surgeon: Festus Aloe, MD;  Location: [facility];  Service: Urology;  Laterality: Right;  . CYSTOSCOPY WITH URETEROSCOPY AND STENT  PLACEMENT Right 03/26/2014   Procedure: CYSTO WITH RIGHT URETEROSCOPY/STENT PLACEMENT;  Surgeon: Festus Aloe, MD;  Location: [facility];  Service: Urology;  Laterality: Right;  . EXTRACORPOREAL SHOCK WAVE LITHOTRIPSY Right 12-17-2013  . HOLMIUM LASER APPLICATION Right 0/09/4915   Procedure: HOLMIUM LASER APPLICATION;  Surgeon: Festus Aloe, MD;  Location: [facility];  Service: Urology;  Laterality: Right;  . INGUINAL HERNIA REPAIR Bilateral right 2003/   left 2004  . STONE EXTRACTION WITH BASKET Right 03/26/2014   Procedure: STONE EXTRACTION WITH BASKET;  Surgeon: Festus Aloe, MD;  Location: [facility];  Service: Urology;  Laterality: Right;  . 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 UMBILICAL HERNIA REPAIR  2000    There were no vitals filed for this visit.      Subjective Assessment - 11/15/16 1018    Subjective Walked a lot at [employer] and knee was really sore, iced helped. Sees MD this Thursday.    Currently in Pain? Yes   Pain Score 3    Pain Location Knee   Pain Orientation Right   Pain Descriptors / Indicators Sore   Aggravating Factors  doing a lot of walking   Pain Relieving Factors Ice   Multiple Pain Sites No            [facility] PT Assessment - 11/15/16 0001      AROM   Right Knee Extension 10  post session  [place] Adult PT Treatment/Exercise - 11/15/16 0001      Knee/Hip Exercises: Stretches   Gastroc Stretch Right;3 reps;30 seconds     Knee/Hip Exercises: Aerobic   Stationary Bike 6 min full forward revolution    Nustep L1  x 10 min  PTA present for status update     Knee/Hip Exercises: Standing   Forward Step Up Right;1 set;10 reps;Hand Hold: 1;Step Height: 6"   Rebounder weight shifting 1 min 3 ways   Other Standing Knee Exercises step taps weight bear on right 10x2     Vasopneumatic   Number Minutes Vasopneumatic  15 minutes   Vasopnuematic Location   Knee  right   Vasopneumatic Pressure High   Vasopneumatic Temperature  3 snowflakes     Manual Therapy   Soft tissue mobilization Post knee/ham/calf : scar mobs   Manual Lymphatic Drainage (MLD) to right knee to reduce swelling and redness   Other Manual Therapy grade 2 patellar mobs med/lateral, superior/inferior                  PT Short Term Goals - 11/15/16 1059      PT SHORT TERM GOAL #1   Title be independent in initial HEP   Time 4   Period Weeks   Status Achieved     PT SHORT TERM GOAL #4   Title pain in right knee with walking decreased >/= 25%   Time 4   Period Weeks   Status Achieved     PT SHORT TERM GOAL #5   Title right knee extension >/= -5 degrees making it easier to walk   Time 4   Period Weeks   Status On-going           PT Long Term Goals - 11/11/16 1505      PT LONG TERM GOAL #1   Title be independendent in advanced HEP   Time 8   Period Weeks   Status On-going     PT LONG TERM GOAL #2   Title able to ambulate without an assistive device due to increased right knee strength >/= 4/5   Time 8   Period Weeks   Status On-going     PT LONG TERM GOAL #3   Title able to go up steps with step over step patter due to increased right knee flexion >/= 115 degrees   Time 8   Period Weeks   Status On-going     PT LONG TERM GOAL #4   Title right knee extension AROM >/= -5 degrees so he is able to stand to perform daily activities without difficulty   Time 8   Period Weeks   Status On-going     PT LONG TERM GOAL #5   Title FOTO score </= 47% limitaiton   Time 8   Period Weeks   Status On-going               Plan - 11/15/16 1017    Clinical Impression Statement Active extension continues to improve. Pt measured at 10 degrees at the end of his treatment today. Pt exhibits weakness in standing step ups which is not unusual for his post op time.  Moderate swelling  persists but  reduces greatly after the GAme Ready.  Met STG #1  and pain goal with walking. Pt reports he essentially has no pain with basic walking.  Hills/declines and prolonged walking ( hours) still make him sore.    Rehab Potential Excellent  Clinical Impairments Affecting Rehab Potential Right TKR on 11/02/2016   PT Frequency 2x / week   PT Duration 8 weeks   PT Treatment/Interventions ADLs/Self Care Home Management;Cryotherapy;Electrical Stimulation;Functional mobility training;Stair training;Gait training;Ultrasound;Moist Heat;Therapeutic activities;Therapeutic exercise;Balance training;Neuromuscular re-education;Patient/family education;Passive range of motion;Scar mobilization;Manual techniques;Taping;Vasopneumatic Device   PT Next Visit Plan Route MD note, Knee ROM & strength, Game Ready for edema   Consulted and Agree with Plan of Care Patient      Patient will benefit from skilled therapeutic intervention in order to improve the following deficits and impairments:  Abnormal gait, Decreased strength, Impaired flexibility, Pain, Decreased activity tolerance, Decreased endurance, Difficulty walking, Decreased range of motion, Increased edema, Decreased scar mobility, Decreased mobility  Visit Diagnosis: Stiffness of right knee, not elsewhere classified  Muscle weakness (generalized)  Other abnormalities of gait and mobility  Acute pain of right knee  Right knee pain, unspecified chronicity  Localized edema Earlie Counts, PT 12/02/16 4:45 PM      Problem List Patient Active Problem List   Diagnosis Date Noted  . Adjustment disorder with depressed mood 05/30/2013  . Obesity (BMI 30-39.9) 04/23/2013  . RBBB 10/10/2012  . CONSTIPATION 07/28/2010  . LATERAL EPICONDYLITIS 01/12/2010  . LUMBAGO 06/23/2009  . ACTINIC KERATOSIS 03/19/2009  . CATARACT EXTRACTION STATUS 03/19/2009  . Hyperlipidemia 11/01/2008  . 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 UNSPECIFIED HEARING LOSS 11/01/2008  . Essential hypertension 11/01/2008  . ROTATOR CUFF SYNDROME, LEFT  11/01/2008    Sharmon Cheramie, PTA 11/15/2016, 11:02 AM  [facility] [address] [place], [place], [address] Phone: [phone]   Fax:  [phone]  Name: Lee Smith MRN: [id number] Date of Birth: [birth year]

## 2016-11-18 ENCOUNTER — Encounter: Payer: Self-pay | Admitting: Physical Therapy

## 2016-11-18 ENCOUNTER — Ambulatory Visit: Payer: Medicare Other | Attending: Adult Reconstructive Orthopaedic Surgery | Admitting: Physical Therapy

## 2016-11-18 DIAGNOSIS — M25561 Pain in right knee: Secondary | ICD-10-CM | POA: Insufficient documentation

## 2016-11-18 DIAGNOSIS — R2689 Other abnormalities of gait and mobility: Secondary | ICD-10-CM | POA: Insufficient documentation

## 2016-11-18 DIAGNOSIS — M25661 Stiffness of right knee, not elsewhere classified: Secondary | ICD-10-CM | POA: Insufficient documentation

## 2016-11-18 DIAGNOSIS — J45909 Unspecified asthma, uncomplicated: Secondary | ICD-10-CM | POA: Diagnosis not present

## 2016-11-18 DIAGNOSIS — Z471 Aftercare following joint replacement surgery: Secondary | ICD-10-CM | POA: Diagnosis not present

## 2016-11-18 DIAGNOSIS — Z87891 Personal history of nicotine dependence: Secondary | ICD-10-CM | POA: Diagnosis not present

## 2016-11-18 DIAGNOSIS — Z96653 Presence of artificial knee joint, bilateral: Secondary | ICD-10-CM | POA: Diagnosis not present

## 2016-11-18 DIAGNOSIS — M6281 Muscle weakness (generalized): Secondary | ICD-10-CM | POA: Insufficient documentation

## 2016-11-18 DIAGNOSIS — R600 Localized edema: Secondary | ICD-10-CM | POA: Insufficient documentation

## 2016-11-18 DIAGNOSIS — I10 Essential (primary) hypertension: Secondary | ICD-10-CM | POA: Diagnosis not present

## 2016-11-18 NOTE — Therapy (Addendum)
[facility] [address] [place], [place], [address] Phone: [phone]   Fax:  [phone]  Physical Therapy Treatment  Patient Details  Name: Lee Smith MRN: [id number] Date of Birth: [birth year] Referring Provider: Penelope Coop  Encounter Date: 11/18/2016      PT End of Session - 11/18/16 1012    Visit Number 6   Number of Visits 11   Date for PT Re-Evaluation 12/31/16   Authorization Type medicare g-code 11th visit; kx on 15th visit   PT Start Time 0930   PT Stop Time 1010   PT Time Calculation (min) 40 min   Activity Tolerance Patient tolerated treatment well   Behavior During Therapy [employer] for tasks assessed/performed      Past Medical History:  Diagnosis Date  . Allergic rhinitis   . Arthritis    bilateral knees; has planned partial knee replacement next month  . Genital warts   . History of kidney stones 11/2013   lithotripsy for right ureteral stone  . Hx of colonic polyps    2008  . Hyperlipidemia   . Hypertension   . Right nephrolithiasis     Past Surgical History:  Procedure Laterality Date  . CHOLECYSTECTOMY  2002  . CYSTOSCOPY W/ URETERAL STENT PLACEMENT Right 02/12/2014   Procedure: CYSTOSCOPY WITH RETROGRADE PYELOGRAM/URETERAL STENT PLACEMENT;  Surgeon: Festus Aloe, MD;  Location: [facility];  Service: Urology;  Laterality: Right;  . CYSTOSCOPY WITH RETROGRADE PYELOGRAM, URETEROSCOPY AND STENT PLACEMENT Right 02/12/2014   Procedure: RIGHT URETEROSCOPY ;  Surgeon: Festus Aloe, MD;  Location: [facility];  Service: Urology;  Laterality: Right;  . CYSTOSCOPY WITH URETEROSCOPY AND STENT PLACEMENT Right 03/05/2014   Procedure: CYSTOSCOPY WITH URETEROSCOPY AND STENT EXCHANGE, STONE EXTRACTION;  Surgeon: Festus Aloe, MD;  Location: [facility];  Service: Urology;  Laterality: Right;  . CYSTOSCOPY WITH URETEROSCOPY AND STENT  PLACEMENT Right 03/26/2014   Procedure: CYSTO WITH RIGHT URETEROSCOPY/STENT PLACEMENT;  Surgeon: Festus Aloe, MD;  Location: [facility];  Service: Urology;  Laterality: Right;  . EXTRACORPOREAL SHOCK WAVE LITHOTRIPSY Right 12-17-2013  . HOLMIUM LASER APPLICATION Right 01/13/3150   Procedure: HOLMIUM LASER APPLICATION;  Surgeon: Festus Aloe, MD;  Location: [facility];  Service: Urology;  Laterality: Right;  . INGUINAL HERNIA REPAIR Bilateral right 2003/   left 2004  . STONE EXTRACTION WITH BASKET Right 03/26/2014   Procedure: STONE EXTRACTION WITH BASKET;  Surgeon: Festus Aloe, MD;  Location: [facility];  Service: Urology;  Laterality: Right;  . 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 UMBILICAL HERNIA REPAIR  2000    There were no vitals filed for this visit.      Subjective Assessment - 11/18/16 0932    Subjective I am much better than a week ago.    Pertinent History Rt TKA 11/02/16, Lt TKA 02/2015   Patient Stated Goals pre-op for total knee,  After surgery: improve A/ROM, strength, reduce edema, improve gait   Currently in Pain? Yes   Pain Score 1   patient doesn't like this question   Pain Location Knee   Pain Orientation Right   Pain Descriptors / Indicators Sore   Pain Type Surgical pain   Pain Onset In the past 7 days   Pain Frequency Intermittent   Aggravating Factors  walking   Pain Relieving Factors ice   Effect of Pain on Daily Activities do everything in short spurts  Multiple Pain Sites No            [facility] PT Assessment - 11/18/16 0001      AROM   Right Knee Extension 5   Right Knee Flexion 124                     [facility] Adult PT Treatment/Exercise - 11/18/16 0001      Knee/Hip Exercises: Aerobic   Stationary Bike 6 min full forward revolution    Nustep L3  x 10 min  PT present for status update     Knee/Hip Exercises: Standing   Forward Step Up Right;1 set;10 reps;Hand Hold:  1;Step Height: 6"   Rebounder weight shifting 1 min 3 ways     Knee/Hip Exercises: Seated   Long Arc Quad Right;1 set;15 reps;AROM  2 lb   Other Seated Knee/Hip Exercises internal/external rotation hip - red band - 15x each   Hamstring Curl Strengthening;Right;15 reps  red band     Manual Therapy   Soft tissue mobilization Post knee/ham/calf : scar mobs                  PT Short Term Goals - 11/15/16 1059      PT SHORT TERM GOAL #1   Title be independent in initial HEP   Time 4   Period Weeks   Status Achieved     PT SHORT TERM GOAL #4   Title pain in right knee with walking decreased >/= 25%   Time 4   Period Weeks   Status Achieved     PT SHORT TERM GOAL #5   Title right knee extension >/= -5 degrees making it easier to walk   Time 4   Period Weeks   Status On-going           PT Long Term Goals - 11/11/16 1505      PT LONG TERM GOAL #1   Title be independendent in advanced HEP   Time 8   Period Weeks   Status On-going     PT LONG TERM GOAL #2   Title able to ambulate without an assistive device due to increased right knee strength >/= 4/5   Time 8   Period Weeks   Status On-going     PT LONG TERM GOAL #3   Title able to go up steps with step over step patter due to increased right knee flexion >/= 115 degrees   Time 8   Period Weeks   Status On-going     PT LONG TERM GOAL #4   Title right knee extension AROM >/= -5 degrees so he is able to stand to perform daily activities without difficulty   Time 8   Period Weeks   Status On-going     PT LONG TERM GOAL #5   Title FOTO score </= 47% limitaiton   Time 8   Period Weeks   Status On-going               Plan - 11/18/16 1012    Clinical Impression Statement ROM continues to improve.  Pt is doing well with pain and edema management.  Deferred ice today due to going straigh home and will ice there.  Pt will continue to need skilled PT for improved extension ROM, edema management  and strengthening LE.   Rehab Potential Excellent   PT Treatment/Interventions ADLs/Self Care Home Management;Cryotherapy;Electrical Stimulation;Functional mobility training;Stair training;Gait training;Ultrasound;Moist Heat;Therapeutic activities;Therapeutic exercise;Balance training;Neuromuscular re-education;Patient/family  education;Passive range of motion;Scar mobilization;Manual techniques;Taping;Vasopneumatic Device   PT Next Visit Plan continue ROM, strengthening LE, manual and vaso as needed   PT Home Exercise Plan progress as needed   Consulted and Agree with Plan of Care Patient      Patient will benefit from skilled therapeutic intervention in order to improve the following deficits and impairments:  Abnormal gait, Decreased strength, Impaired flexibility, Pain, Decreased activity tolerance, Decreased endurance, Difficulty walking, Decreased range of motion, Increased edema, Decreased scar mobility, Decreased mobility  Visit Diagnosis: Stiffness of right knee, not elsewhere classified  Muscle weakness (generalized)  Other abnormalities of gait and mobility  Acute pain of right knee  Right knee pain, unspecified chronicity  localized edema Earlie Counts, PT 12/02/16 4:47 PM     Problem List Patient Active Problem List   Diagnosis Date Noted  . Adjustment disorder with depressed mood 05/30/2013  . Obesity (BMI 30-39.9) 04/23/2013  . RBBB 10/10/2012  . CONSTIPATION 07/28/2010  . LATERAL EPICONDYLITIS 01/12/2010  . LUMBAGO 06/23/2009  . ACTINIC KERATOSIS 03/19/2009  . CATARACT EXTRACTION STATUS 03/19/2009  . Hyperlipidemia 11/01/2008  . 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 UNSPECIFIED HEARING LOSS 11/01/2008  . Essential hypertension 11/01/2008  . ROTATOR CUFF SYNDROME, 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    Zannie Cove, PT  11/18/2016, 10:17 AM  [facility] [address] [place], [place], [address] Phone: [phone]   Fax:   [phone]  Name: FORREST JAROSZEWSKI MRN: [id number] Date of Birth: August 28, 1945

## 2016-11-22 ENCOUNTER — Encounter: Payer: Medicare Other | Admitting: Physical Therapy

## 2016-11-23 ENCOUNTER — Other Ambulatory Visit: Payer: Self-pay | Admitting: Family Medicine

## 2016-11-25 ENCOUNTER — Ambulatory Visit: Payer: Medicare Other | Admitting: Physical Therapy

## 2016-11-25 DIAGNOSIS — R2689 Other abnormalities of gait and mobility: Secondary | ICD-10-CM

## 2016-11-25 DIAGNOSIS — M25661 Stiffness of right knee, not elsewhere classified: Secondary | ICD-10-CM | POA: Diagnosis not present

## 2016-11-25 DIAGNOSIS — R600 Localized edema: Secondary | ICD-10-CM

## 2016-11-25 DIAGNOSIS — M25561 Pain in right knee: Secondary | ICD-10-CM | POA: Diagnosis not present

## 2016-11-25 DIAGNOSIS — M6281 Muscle weakness (generalized): Secondary | ICD-10-CM

## 2016-11-25 NOTE — Therapy (Addendum)
[facility] [address], [place], [place], [address] Phone: [phone]   Fax:  [phone]  Physical Therapy Treatment  Patient Details  Name: Lee Smith MRN: [id number] Date of Birth: 1945/10/11 Referring Provider: Penelope Coop  Encounter Date: 11/25/2016      PT End of Session - 11/25/16 1019    Visit Number 7   Number of Visits 11   Date for PT Re-Evaluation 12/31/16   Authorization Type medicare g-code 11th visit; kx on 15th visit   PT Start Time 1012   PT Stop Time 1102   PT Time Calculation (min) 50 min   Activity Tolerance Patient tolerated treatment well      Past Medical History:  Diagnosis Date  . Allergic rhinitis   . Arthritis    bilateral knees; has planned partial knee replacement next month  . Genital warts   . History of kidney stones 11/2013   lithotripsy for right ureteral stone  . Hx of colonic polyps    2008  . Hyperlipidemia   . Hypertension   . Right nephrolithiasis     Past Surgical History:  Procedure Laterality Date  . CHOLECYSTECTOMY  2002  . CYSTOSCOPY W/ URETERAL STENT PLACEMENT Right 02/12/2014   Procedure: CYSTOSCOPY WITH RETROGRADE PYELOGRAM/URETERAL STENT PLACEMENT;  Surgeon: Festus Aloe, MD;  Location: [facility];  Service: Urology;  Laterality: Right;  . CYSTOSCOPY WITH RETROGRADE PYELOGRAM, URETEROSCOPY AND STENT PLACEMENT Right 02/12/2014   Procedure: RIGHT URETEROSCOPY ;  Surgeon: Festus Aloe, MD;  [facility];  Service: Urology;  Laterality: Right;  . CYSTOSCOPY WITH URETEROSCOPY AND STENT PLACEMENT Right 03/05/2014   Procedure: CYSTOSCOPY WITH URETEROSCOPY AND STENT EXCHANGE, STONE EXTRACTION;  Surgeon: Festus Aloe, MD;  Location: [facility];  Service: Urology;  Laterality: Right;  . CYSTOSCOPY WITH URETEROSCOPY AND STENT PLACEMENT Right 03/26/2014   Procedure: CYSTO WITH RIGHT  URETEROSCOPY/STENT PLACEMENT;  Surgeon: Festus Aloe, MD;  Location: [facility];  Service: Urology;  Laterality: Right;  . EXTRACORPOREAL SHOCK WAVE LITHOTRIPSY Right 12-17-2013  . HOLMIUM LASER APPLICATION Right 0/97/3532   Procedure: HOLMIUM LASER APPLICATION;  Surgeon: Festus Aloe, MD;  Location: [facility];  Service: Urology;  Laterality: Right;  . INGUINAL HERNIA REPAIR Bilateral right 2003/   left 2004  . STONE EXTRACTION WITH BASKET Right 03/26/2014   Procedure: STONE EXTRACTION WITH BASKET;  Surgeon: Festus Aloe, MD;  Location: [facility];  Service: Urology;  Laterality: Right;  . 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 UMBILICAL HERNIA REPAIR  2000    There were no vitals filed for this visit.      Subjective Assessment - 11/25/16 1013    Subjective Pain has been better.  I take Tylenol every now and then.  The swelling is less and less.  I overdid it a little yesterday in the yard.   I get to drive on Monday.     Pertinent History Rt TKA 11/02/16, Lt TKA 02/2015   Currently in Pain? No/denies   Pain Score 0-No pain   Pain Location Knee   Pain Type Surgical pain   Aggravating Factors  overdoing it            [facility] PT Assessment - 11/25/16 0001      AROM   Right Knee Extension 5   Right Knee Flexion 129     Strength   Right Knee Flexion 4-/5  Right Knee Extension 3+/5                     [facility] Adult PT Treatment/Exercise - 11/25/16 0001      Therapeutic Activites    ADL's sit to stand, up and down stairs, walking, standing     Neuro Re-ed    Neuro Re-ed Details  quad activation and dynamic balance      Knee/Hip Exercises: Stretches   Active Hamstring Stretch Right;3 reps;20 seconds  on steps   Quad Stretch Right;3 reps;30 seconds   Gastroc Stretch Right;3 reps;30 seconds     Knee/Hip Exercises: Aerobic   Stationary Bike 10 min full revolutions     Knee/Hip Exercises: Machines  for Strengthening   Total Gym Leg Press 75# bilateral 15x; 55# 2x15 right only     Knee/Hip Exercises: Standing   Forward Step Up Right;1 set;10 reps;Hand Hold: 1;Step Height: 6"   Step Down Right;1 set;10 reps;Hand Hold: 0;Step Height: 2"   SLS with Vectors 3 ways 10x standing on right   Walking with Sports Cord 35# backwards and forwards  10x   Other Standing Knee Exercises floor ladder high step, side step and in/out of boxes 2 laps each     Knee/Hip Exercises: Seated   Long Arc Quad Strengthening;Right;20 reps;Weights   Long Arc Quad Weight 4 lbs.   Hamstring Curl Strengthening;Right;15 reps   Hamstring Limitations green band     Vasopneumatic   Number Minutes Vasopneumatic  10 minutes   Vasopnuematic Location  Knee   Vasopneumatic Pressure High   Vasopneumatic Temperature  3 snowflakes                  PT Short Term Goals - 11/25/16 1019      PT SHORT TERM GOAL #1   Title be independent in initial HEP   Status Achieved     PT SHORT TERM GOAL #2   Title right knee flexion PROM >/= 110 degrees due to increased mobility   Status Achieved     PT SHORT TERM GOAL #3   Title ambulate with a single point cane due to increased right knee strength >/= 3/5   Status Achieved     PT SHORT TERM GOAL #4   Title pain in right knee with walking decreased >/= 25%   Status Achieved     PT SHORT TERM GOAL #5   Title right knee extension >/= -5 degrees making it easier to walk   Status Achieved           PT Long Term Goals - 11/25/16 1020      PT LONG TERM GOAL #1   Title be independendent in advanced HEP   Time 8   Period Weeks   Status On-going     PT LONG TERM GOAL #2   Title able to ambulate without an assistive device due to increased right knee strength >/= 4/5   Time 8   Period Weeks   Status On-going     PT LONG TERM GOAL #3   Title able to go up steps with step over step patter due to increased right knee flexion >/= 115 degrees   Time 8   Period  Weeks   Status On-going     PT LONG TERM GOAL #4   Title right knee extension AROM >/= -5 degrees so he is able to stand to perform daily activities without difficulty   Time 8   Period  Weeks   Status On-going     PT LONG TERM GOAL #5   Title FOTO score </= 47% limitaiton   Time 8   Period Weeks   Status On-going               Plan - 11/25/16 1053    Clinical Impression Statement The patient is progressing well with knee ROM with 129 degrees of knee flexion.  Knee extension lacks 5 degrees secondary to edema and terminal knee quad strength.  Weakness with quad eccentric step dowsn.  Minimal limp present ambulating without cane.     PT Treatment/Interventions ADLs/Self Care Home Management;Cryotherapy;Electrical Stimulation;Functional mobility training;Stair training;Gait training;Ultrasound;Moist Heat;Therapeutic activities;Therapeutic exercise;Balance training;Neuromuscular re-education;Patient/family education;Passive range of motion;Scar mobilization;Manual techniques;Taping;Vasopneumatic Device   PT Next Visit Plan continue ROM, progressive strengthening LE, manual and vaso as needed      Patient will benefit from skilled therapeutic intervention in order to improve the following deficits and impairments:  Abnormal gait, Decreased strength, Impaired flexibility, Pain, Decreased activity tolerance, Decreased endurance, Difficulty walking, Decreased range of motion, Increased edema, Decreased scar mobility, Decreased mobility  Visit Diagnosis: Stiffness of right knee, not elsewhere classified  Muscle weakness (generalized)  Other abnormalities of gait and mobility  Localized edema   Problem List Patient Active Problem List   Diagnosis Date Noted  . Adjustment disorder with depressed mood 05/30/2013  . Obesity (BMI 30-39.9) 04/23/2013  . RBBB 10/10/2012  . CONSTIPATION 07/28/2010  . LATERAL EPICONDYLITIS 01/12/2010  . LUMBAGO 06/23/2009  . ACTINIC KERATOSIS  03/19/2009  . CATARACT EXTRACTION STATUS 03/19/2009  . Hyperlipidemia 11/01/2008  . 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 UNSPECIFIED HEARING LOSS 11/01/2008  . Essential hypertension 11/01/2008  . ROTATOR CUFF SYNDROME, 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   Lee Smith, PT 11/25/16 10:57 AM Phone: [phone] Fax: [phone]  Alvera Singh 11/25/2016, 10:57 AM  [facility] [address] [place], [place], [address] Phone: [phone]   Fax:  [phone]  Name: ALWIN LANIGAN MRN: [id number] Date of Birth: [birth year]

## 2016-11-29 ENCOUNTER — Encounter: Payer: Self-pay | Admitting: Physical Therapy

## 2016-11-29 ENCOUNTER — Ambulatory Visit: Payer: Medicare Other | Admitting: Physical Therapy

## 2016-11-29 DIAGNOSIS — R2689 Other abnormalities of gait and mobility: Secondary | ICD-10-CM | POA: Diagnosis not present

## 2016-11-29 DIAGNOSIS — R600 Localized edema: Secondary | ICD-10-CM

## 2016-11-29 DIAGNOSIS — M25661 Stiffness of right knee, not elsewhere classified: Secondary | ICD-10-CM | POA: Diagnosis not present

## 2016-11-29 DIAGNOSIS — M6281 Muscle weakness (generalized): Secondary | ICD-10-CM

## 2016-11-29 DIAGNOSIS — M25561 Pain in right knee: Secondary | ICD-10-CM

## 2016-11-29 NOTE — Therapy (Addendum)
[facility] [address] [place], [place], [address] Phone: [phone]   Fax:  [phone]  Physical Therapy Treatment  Patient Details  Name: Lee Smith MRN: [id number] Date of Birth: [birth year] Referring Provider: Penelope Coop  Encounter Date: 11/29/2016      PT End of Session - 11/29/16 1011    Visit Number 8   Number of Visits 11   Date for PT Re-Evaluation 12/31/16   Authorization Type medicare g-code 11th visit; kx on 15th visit   PT Start Time 1011   PT Stop Time 1105   PT Time Calculation (min) 54 min   Activity Tolerance Patient tolerated treatment well   Behavior During Therapy [facility] for tasks assessed/performed      Past Medical History:  Diagnosis Date  . Allergic rhinitis   . Arthritis    bilateral knees; has planned partial knee replacement next month  . Genital warts   . History of kidney stones 11/2013   lithotripsy for right ureteral stone  . Hx of colonic polyps    2008  . Hyperlipidemia   . Hypertension   . Right nephrolithiasis     Past Surgical History:  Procedure Laterality Date  . CHOLECYSTECTOMY  2002  . CYSTOSCOPY W/ URETERAL STENT PLACEMENT Right 02/12/2014   Procedure: CYSTOSCOPY WITH RETROGRADE PYELOGRAM/URETERAL STENT PLACEMENT;  Surgeon: Festus Aloe, MD;  Location: [facility];  Service: Urology;  Laterality: Right;  . CYSTOSCOPY WITH RETROGRADE PYELOGRAM, URETEROSCOPY AND STENT PLACEMENT Right 02/12/2014   Procedure: RIGHT URETEROSCOPY ;  Surgeon: Festus Aloe, MD;  Location: [facility];  Service: Urology;  Laterality: Right;  . CYSTOSCOPY WITH URETEROSCOPY AND STENT PLACEMENT Right 03/05/2014   Procedure: CYSTOSCOPY WITH URETEROSCOPY AND STENT EXCHANGE, STONE EXTRACTION;  Surgeon: Festus Aloe, MD;  Location: [facility];  Service: Urology;  Laterality: Right;  . CYSTOSCOPY WITH URETEROSCOPY AND STENT  PLACEMENT Right 03/26/2014   Procedure: CYSTO WITH RIGHT URETEROSCOPY/STENT PLACEMENT;  Surgeon: Festus Aloe, MD;  Location: [facility];  Service: Urology;  Laterality: Right;  . EXTRACORPOREAL SHOCK WAVE LITHOTRIPSY Right 12-17-2013  . HOLMIUM LASER APPLICATION Right 8/91/6945   Procedure: HOLMIUM LASER APPLICATION;  Surgeon: Festus Aloe, MD;  Location: [facility];  Service: Urology;  Laterality: Right;  . INGUINAL HERNIA REPAIR Bilateral right 2003/   left 2004  . STONE EXTRACTION WITH BASKET Right 03/26/2014   Procedure: STONE EXTRACTION WITH BASKET;  Surgeon: Festus Aloe, MD;  Location: [facility];  Service: Urology;  Laterality: Right;  . 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 UMBILICAL HERNIA REPAIR  2000    There were no vitals filed for this visit.      Subjective Assessment - 11/29/16 1012    Subjective No complaints today.   Pertinent History Rt TKA 11/02/16, Lt TKA 02/2015   Patient Stated Goals pre-op for total knee,  After surgery: improve A/ROM, strength, reduce edema, improve gait   Currently in Pain? No/denies   Multiple Pain Sites No                         [facility] Adult PT Treatment/Exercise - 11/29/16 0001      Knee/Hip Exercises: Stretches   Active Hamstring Stretch Right;3 reps;20 seconds  on steps   Quad Stretch Right;3 reps;30 seconds  In prone with strap   Gastroc Stretch Both;2 reps;20 seconds  On slant board  Knee/Hip Exercises: Aerobic   Stationary Bike 10 min L3   Review of status     Knee/Hip Exercises: Machines for Strengthening   Cybex Knee Extension 1 plate 2x10 bil  VC for slower speed and TKE   Total Gym Leg Press Seat #7 Bil 2x 10 90# RTLE 50# 2x10     Knee/Hip Exercises: Standing   Forward Step Up Right;3 sets;10 reps;Hand Hold: 0;Step Height: 6"   Step Down Right;1 set;10 reps;Hand Hold: 2;Step Height: 4"   SLS Blue pod; worked towards 10 sec no UE,  difficullt     Walking with Sports Cord 35# backwards and forwards  10x     Vasopneumatic   Number Minutes Vasopneumatic  15 minutes   Vasopnuematic Location  Knee   Vasopneumatic Pressure High   Vasopneumatic Temperature  3 snowflakes                  PT Short Term Goals - 11/25/16 1019      PT SHORT TERM GOAL #1   Title be independent in initial HEP   Status Achieved     PT SHORT TERM GOAL #2   Title right knee flexion PROM >/= 110 degrees due to increased mobility   Status Achieved     PT SHORT TERM GOAL #3   Title ambulate with a single point cane due to increased right knee strength >/= 3/5   Status Achieved     PT SHORT TERM GOAL #4   Title pain in right knee with walking decreased >/= 25%   Status Achieved     PT SHORT TERM GOAL #5   Title right knee extension >/= -5 degrees making it easier to walk   Status Achieved           PT Long Term Goals - 11/29/16 1018      PT LONG TERM GOAL #3   Title able to go up steps with step over step patter due to increased right knee flexion >/= 115 degrees   Time 8   Period Weeks   Status Achieved               Plan - 11/29/16 1018    Clinical Impression Statement Pt reports he can go up & down stairs reciprocally with some feeling of tightness and no pain, meeting this long term goal. Little to no pain complaints with the addition of being able to sleep through the night comfortably at this time consistently. Weakness at end range extension and moderate wobbliness with single stance .    Rehab Potential Excellent   Clinical Impairments Affecting Rehab Potential Right TKR on 11/02/2016   PT Frequency 2x / week   PT Duration 8 weeks   PT Treatment/Interventions ADLs/Self Care Home Management;Cryotherapy;Electrical Stimulation;Functional mobility training;Stair training;Gait training;Ultrasound;Moist Heat;Therapeutic activities;Therapeutic exercise;Balance training;Neuromuscular re-education;Patient/family  education;Passive range of motion;Scar mobilization;Manual techniques;Taping;Vasopneumatic Device   PT Next Visit Plan continue ROM, progressive strengthening LE, manual and vaso as needed   Consulted and Agree with Plan of Care Patient      Patient will benefit from skilled therapeutic intervention in order to improve the following deficits and impairments:  Abnormal gait, Decreased strength, Impaired flexibility, Pain, Decreased activity tolerance, Decreased endurance, Difficulty walking, Decreased range of motion, Increased edema, Decreased scar mobility, Decreased mobility  Visit Diagnosis: Stiffness of right knee, not elsewhere classified  Muscle weakness (generalized)  Other abnormalities of gait and mobility  Acute pain of right knee  Right knee pain, unspecified  chronicity  Localized edema Lee Smith, PT 12/02/16 4:50 PM     Problem List Patient Active Problem List   Diagnosis Date Noted  . Adjustment disorder with depressed mood 05/30/2013  . Obesity (BMI 30-39.9) 04/23/2013  . RBBB 10/10/2012  . CONSTIPATION 07/28/2010  . LATERAL EPICONDYLITIS 01/12/2010  . LUMBAGO 06/23/2009  . ACTINIC KERATOSIS 03/19/2009  . CATARACT EXTRACTION STATUS 03/19/2009  . Hyperlipidemia 11/01/2008  . 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 UNSPECIFIED HEARING LOSS 11/01/2008  . Essential hypertension 11/01/2008  . ROTATOR CUFF SYNDROME, 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    Lee Smith, PTA 11/29/2016, 10:48 AM   [facility] [address], [place], [place], [address] Phone: [phone]   Fax:  [phone]  Name: Lee Smith MRN: [id number] Date of Birth: 09-15-1945

## 2016-12-02 ENCOUNTER — Ambulatory Visit: Payer: Medicare Other | Admitting: Physical Therapy

## 2016-12-02 DIAGNOSIS — M25661 Stiffness of right knee, not elsewhere classified: Secondary | ICD-10-CM | POA: Diagnosis not present

## 2016-12-02 DIAGNOSIS — R600 Localized edema: Secondary | ICD-10-CM

## 2016-12-02 DIAGNOSIS — M6281 Muscle weakness (generalized): Secondary | ICD-10-CM

## 2016-12-02 DIAGNOSIS — R2689 Other abnormalities of gait and mobility: Secondary | ICD-10-CM

## 2016-12-02 DIAGNOSIS — M25561 Pain in right knee: Secondary | ICD-10-CM

## 2016-12-02 NOTE — Therapy (Signed)
[facility] [address] [place], [place], [address] Phone: [phone]   Fax:  [phone]  Physical Therapy Treatment  Patient Details  Name: Lee Smith MRN: [id number] Date of Birth: 1946-01-27 Referring Provider: Penelope Coop  Encounter Date: 12/02/2016      PT End of Session - 12/02/16 0948    Visit Number 9   Number of Visits 11   Date for PT Re-Evaluation 12/31/16   Authorization Type medicare g-code 11th visit; kx on 15th visit   PT Start Time 0930   PT Stop Time 1030   PT Time Calculation (min) 60 min   Activity Tolerance Patient tolerated treatment well      Past Medical History:  Diagnosis Date  . Allergic rhinitis   . Arthritis    bilateral knees; has planned partial knee replacement next month  . Genital warts   . History of kidney stones 11/2013   lithotripsy for right ureteral stone  . Hx of colonic polyps    2008  . Hyperlipidemia   . Hypertension   . Right nephrolithiasis     Past Surgical History:  Procedure Laterality Date  . CHOLECYSTECTOMY  2002  . CYSTOSCOPY W/ URETERAL STENT PLACEMENT Right 02/12/2014   Procedure: CYSTOSCOPY WITH RETROGRADE PYELOGRAM/URETERAL STENT PLACEMENT;  Surgeon: Festus Aloe, MD;  Location: [facility];  Service: Urology;  Laterality: Right;  . CYSTOSCOPY WITH RETROGRADE PYELOGRAM, URETEROSCOPY AND STENT PLACEMENT Right 02/12/2014   Procedure: RIGHT URETEROSCOPY ;  Surgeon: Festus Aloe, MD;  Location: [facility];  Service: Urology;  Laterality: Right;  . CYSTOSCOPY WITH URETEROSCOPY AND STENT PLACEMENT Right 03/05/2014   Procedure: CYSTOSCOPY WITH URETEROSCOPY AND STENT EXCHANGE, STONE EXTRACTION;  Surgeon: Festus Aloe, MD;  Location: [facility];  Service: Urology;  Laterality: Right;  . CYSTOSCOPY WITH URETEROSCOPY AND STENT PLACEMENT Right 03/26/2014   Procedure: CYSTO WITH RIGHT  URETEROSCOPY/STENT PLACEMENT;  Surgeon: Festus Aloe, MD;  Location: [facility];  Service: Urology;  Laterality: Right;  . EXTRACORPOREAL SHOCK WAVE LITHOTRIPSY Right 12-17-2013  . HOLMIUM LASER APPLICATION Right 10/09/5571   Procedure: HOLMIUM LASER APPLICATION;  Surgeon: Festus Aloe, MD;  Location: [facility];  Service: Urology;  Laterality: Right;  . INGUINAL HERNIA REPAIR Bilateral right 2003/   left 2004  . STONE EXTRACTION WITH BASKET Right 03/26/2014   Procedure: STONE EXTRACTION WITH BASKET;  Surgeon: Festus Aloe, MD;  Location: [facility];  Service: Urology;  Laterality: Right;  . 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 UMBILICAL HERNIA REPAIR  2000    There were no vitals filed for this visit.      Subjective Assessment - 12/02/16 0928    Subjective If I overdo it one day, I take it easy the next.  Been driving since Sunday without issue.  MD 5/31.   Pertinent History Rt TKA 11/02/16, Lt TKA 02/2015   Currently in Pain? No/denies   Pain Score 0-No pain   Pain Location Knee   Pain Orientation Right   Pain Type Surgical pain   Pain Frequency Intermittent   Aggravating Factors  doing the stairs especially going down            [facility] PT Assessment - 12/02/16 0001      AROM   Right Knee Extension 5   Right Knee Flexion 128     PROM   Right Knee Extension --   Right Knee Flexion --  Strength   Right Knee Flexion 4/5   Right Knee Extension 4/5                     [facility] Adult PT Treatment/Exercise - 12/02/16 0001      Therapeutic Activites    ADL's sit to stand, up and down stairs, walking, standing     Neuro Re-ed    Neuro Re-ed Details  quad and gluteal activation and dynamic balance      Knee/Hip Exercises: Stretches   Active Hamstring Stretch Right;3 reps;20 seconds  on steps   Quad Stretch Right;3 reps;30 seconds  on steps   Gastroc Stretch Both;2 reps;20 seconds  On slant  board     Knee/Hip Exercises: Aerobic   Stationary Bike 10 min L3   Review of status     Knee/Hip Exercises: Machines for Strengthening   Cybex Knee Extension 1 plate 2x10 bil  VC for slower speed and TKE   Cybex Knee Flexion 25# HS 2x10   Total Gym Leg Press Seat #7 Bil 2x 10 90# RTLE 50# 2x10     Knee/Hip Exercises: Standing   Forward Step Up Right;3 sets;10 reps;Hand Hold: 0;Step Height: 6"  with chair touch   Step Down Limitations lateral step down 3 points10x   SLS blue pod 4 ways 10x   Other Standing Knee Exercises floor sliders for gluteal strengthening 15x   Other Standing Knee Exercises green band sidestep, cowboys, tightrope walk                  PT Short Term Goals - 12/02/16 1427      PT SHORT TERM GOAL #1   Title be independent in initial HEP   Status Achieved     PT SHORT TERM GOAL #2   Title right knee flexion PROM >/= 110 degrees due to increased mobility   Status Achieved     PT SHORT TERM GOAL #3   Title ambulate with a single point cane due to increased right knee strength >/= 3/5   Status Achieved     PT SHORT TERM GOAL #4   Title pain in right knee with walking decreased >/= 25%   Status Achieved     PT SHORT TERM GOAL #5   Title right knee extension >/= -5 degrees making it easier to walk   Status Achieved           PT Long Term Goals - 12/02/16 1427      PT LONG TERM GOAL #1   Title be independendent in advanced HEP   Time 8   Period Weeks   Status On-going     PT LONG TERM GOAL #2   Title able to ambulate without an assistive device due to increased right knee strength >/= 4/5   Time 8   Period Weeks   Status Achieved     PT LONG TERM GOAL #3   Title able to go up steps with step over step patter due to increased right knee flexion >/= 115 degrees   Status Achieved     PT LONG TERM GOAL #4   Title right knee extension AROM >/= -5 degrees so he is able to stand to perform daily activities without difficulty   Time 8    Period Weeks   Status On-going     PT LONG TERM GOAL #5   Title FOTO score </= 47% limitaiton   Time 8   Period Weeks  Status On-going               Plan - 12/02/16 4920    Clinical Impression Statement The patient is progressing  well with pain reduction, return to function, ROM and strength.  Mild edema and lack of quad motor control limits endrange knee extension.  Patient reports muscular fatigue in gluteals, hamstrings and quads after treatment session.  Moderate anterior knee swelling present however patient declines modalities preferring to apply ice and elevate at home.    Patient should meet remaining goals in 2-3 visits.     PT Treatment/Interventions ADLs/Self Care Home Management;Cryotherapy;Electrical Stimulation;Functional mobility training;Stair training;Gait training;Ultrasound;Moist Heat;Therapeutic activities;Therapeutic exercise;Balance training;Neuromuscular re-education;Patient/family education;Passive range of motion;Scar mobilization;Manual techniques;Taping;Vasopneumatic Device   PT Next Visit Plan continue ROM, progressive strengthening LE, manual and vaso as needed;  MD 5/31;  G code on 11th visit      Patient will benefit from skilled therapeutic intervention in order to improve the following deficits and impairments:     Visit Diagnosis: Stiffness of right knee, not elsewhere classified  Muscle weakness (generalized)  Other abnormalities of gait and mobility  Acute pain of right knee  Localized edema     Problem List Patient Active Problem List   Diagnosis Date Noted  . Adjustment disorder with depressed mood 05/30/2013  . Obesity (BMI 30-39.9) 04/23/2013  . RBBB 10/10/2012  . CONSTIPATION 07/28/2010  . LATERAL EPICONDYLITIS 01/12/2010  . LUMBAGO 06/23/2009  . ACTINIC KERATOSIS 03/19/2009  . CATARACT EXTRACTION STATUS 03/19/2009  . Hyperlipidemia 11/01/2008  . 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 UNSPECIFIED HEARING LOSS 11/01/2008  . Essential  hypertension 11/01/2008  . ROTATOR CUFF SYNDROME, 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   Ruben Im, PT 12/02/16 2:32 PM Phone: [phone] Fax: [phone]  Alvera Singh 12/02/2016, 2:32 PM  [facility] [address], [place], [place], [address] Phone: [phone]   Fax:  [phone]  Name: Lee Smith MRN: [id number] Date of Birth: [birth year]

## 2016-12-06 ENCOUNTER — Encounter: Payer: Medicare Other | Admitting: Physical Therapy

## 2016-12-09 ENCOUNTER — Ambulatory Visit: Payer: Medicare Other | Admitting: Physical Therapy

## 2016-12-09 DIAGNOSIS — R2689 Other abnormalities of gait and mobility: Secondary | ICD-10-CM | POA: Diagnosis not present

## 2016-12-09 DIAGNOSIS — M25561 Pain in right knee: Secondary | ICD-10-CM | POA: Diagnosis not present

## 2016-12-09 DIAGNOSIS — M6281 Muscle weakness (generalized): Secondary | ICD-10-CM

## 2016-12-09 DIAGNOSIS — R600 Localized edema: Secondary | ICD-10-CM

## 2016-12-09 DIAGNOSIS — M25661 Stiffness of right knee, not elsewhere classified: Secondary | ICD-10-CM | POA: Diagnosis not present

## 2016-12-09 NOTE — Therapy (Signed)
[facility] [address] [place], [place], [address] Phone: [phone]   Fax:  [phone]  Physical Therapy Treatment  Patient Details  Name: Lee Smith MRN: [id number] Date of Birth: [birth year] Referring Provider: Penelope Coop  Encounter Date: 12/09/2016      PT End of Session - 12/09/16 0942    Visit Number 10   Number of Visits 11   Date for PT Re-Evaluation 12/31/16   Authorization Type medicare g-code 11th visit; kx on 15th visit   PT Start Time 0932   PT Stop Time 1025   PT Time Calculation (min) 53 min   Activity Tolerance Patient tolerated treatment well      Past Medical History:  Diagnosis Date  . Allergic rhinitis   . Arthritis    bilateral knees; has planned partial knee replacement next month  . Genital warts   . History of kidney stones 11/2013   lithotripsy for right ureteral stone  . Hx of colonic polyps    2008  . Hyperlipidemia   . Hypertension   . Right nephrolithiasis     Past Surgical History:  Procedure Laterality Date  . CHOLECYSTECTOMY  2002  . CYSTOSCOPY W/ URETERAL STENT PLACEMENT Right 02/12/2014   Procedure: CYSTOSCOPY WITH RETROGRADE PYELOGRAM/URETERAL STENT PLACEMENT;  Surgeon: Festus Aloe, MD;  Location: [facility];  Service: Urology;  Laterality: Right;  . CYSTOSCOPY WITH RETROGRADE PYELOGRAM, URETEROSCOPY AND STENT PLACEMENT Right 02/12/2014   Procedure: RIGHT URETEROSCOPY ;  Surgeon: Festus Aloe, MD;  Location: [facility];  Service: Urology;  Laterality: Right;  . CYSTOSCOPY WITH URETEROSCOPY AND STENT PLACEMENT Right 03/05/2014   Procedure: CYSTOSCOPY WITH URETEROSCOPY AND STENT EXCHANGE, STONE EXTRACTION;  Surgeon: Festus Aloe, MD;  Location: [facility];  Service: Urology;  Laterality: Right;  . CYSTOSCOPY WITH URETEROSCOPY AND STENT PLACEMENT Right 03/26/2014   Procedure: CYSTO WITH RIGHT  URETEROSCOPY/STENT PLACEMENT;  Surgeon: Festus Aloe, MD;  Location: [facility];  Service: Urology;  Laterality: Right;  . EXTRACORPOREAL SHOCK WAVE LITHOTRIPSY Right 12-17-2013  . HOLMIUM LASER APPLICATION Right 8/46/6599   Procedure: HOLMIUM LASER APPLICATION;  Surgeon: Festus Aloe, MD;  Location: [facility];  Service: Urology;  Laterality: Right;  . INGUINAL HERNIA REPAIR Bilateral right 2003/   left 2004  . STONE EXTRACTION WITH BASKET Right 03/26/2014   Procedure: STONE EXTRACTION WITH BASKET;  Surgeon: Festus Aloe, MD;  Location: [facility];  Service: Urology;  Laterality: Right;  . 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 UMBILICAL HERNIA REPAIR  2000    There were no vitals filed for this visit.      Subjective Assessment - 12/09/16 0937    Subjective If I overdo it, it might swell and be painful but it gets better if I rest it.  MD 5/31   Pertinent History Rt TKA 11/02/16, Lt TKA 02/2015   Currently in Pain? No/denies   Pain Score 0-No pain   Pain Location Knee   Pain Orientation Right   Pain Type Surgical pain   Pain Onset More than a month ago   Pain Frequency Intermittent                         [facility] Adult PT Treatment/Exercise - 12/09/16 0001      Therapeutic Activites    ADL's sit to stand, up and down stairs, walking, standing  Neuro Re-ed    Neuro Re-ed Details  quad and gluteal activation and dynamic balance      Knee/Hip Exercises: Stretches   Active Hamstring Stretch Right;3 reps;20 seconds  on steps   Quad Stretch Right;3 reps;30 seconds  on steps   Gastroc Stretch Both;2 reps;20 seconds  On slant board     Knee/Hip Exercises: Aerobic   Stationary Bike 10 min L3   Review of status     Knee/Hip Exercises: Machines for Strengthening   Cybex Knee Flexion 30# HS 2x10   Total Gym Leg Press Seat #7 Bil 2x 10 110# RTLE 55# 2x10     Knee/Hip Exercises: Standing   Forward  Step Up Right;3 sets;10 reps;Hand Hold: 0;Step Height: 6"  with chair touch   Step Down Right;1 set;10 reps   Step Down Limitations lateral step down 3 points10x   SLS with UE movements   SLS with Vectors red band bil 10x 4 ways   Other Standing Knee Exercises high stepping, side stepping. crouch side stepping. braiding.       Vasopneumatic   Number Minutes Vasopneumatic  10 minutes   Vasopnuematic Location  Knee   Vasopneumatic Pressure Medium   Vasopneumatic Temperature  3 snowflakes                  PT Short Term Goals - 12/09/16 0941      PT SHORT TERM GOAL #1   Title be independent in initial HEP   Status Achieved     PT SHORT TERM GOAL #2   Title right knee flexion PROM >/= 110 degrees due to increased mobility   Status Achieved     PT SHORT TERM GOAL #3   Title ambulate with a single point cane due to increased right knee strength >/= 3/5   Status Achieved     PT SHORT TERM GOAL #4   Title pain in right knee with walking decreased >/= 25%   Status Achieved     PT SHORT TERM GOAL #5   Title right knee extension >/= -5 degrees making it easier to walk   Status Achieved           PT Long Term Goals - 12/09/16 0942      PT LONG TERM GOAL #1   Title be independendent in advanced HEP   Time 8   Period Weeks   Status On-going     PT LONG TERM GOAL #2   Title able to ambulate without an assistive device due to increased right knee strength >/= 4/5   Status Achieved     PT LONG TERM GOAL #3   Title able to go up steps with step over step patter due to increased right knee flexion >/= 115 degrees   Status Achieved     PT LONG TERM GOAL #4   Title right knee extension AROM >/= -5 degrees so he is able to stand to perform daily activities without difficulty   Period Weeks   Status On-going     PT LONG TERM GOAL #5   Title FOTO score </= 47% limitaiton   Time 8   Period Weeks   Status On-going               Plan - 12/09/16 5956     Clinical Impression Statement Patient is able to ambulate without an assistive device, feels he is able to walk about 1 1/2 miles at a time.  Able to go up  and down steps reciprocally without UE support.  Should meet remaining goals by next week and probable discharge next visit.     Clinical Impairments Affecting Rehab Potential Right TKR on 11/02/2016   PT Treatment/Interventions ADLs/Self Care Home Management;Cryotherapy;Electrical Stimulation;Functional mobility training;Stair training;Gait training;Ultrasound;Moist Heat;Therapeutic activities;Therapeutic exercise;Balance training;Neuromuscular re-education;Patient/family education;Passive range of motion;Scar mobilization;Manual techniques;Taping;Vasopneumatic Device   PT Next Visit Plan Do FOTO and G code next visit.  probable discharge next visit;  recheck ROM/  MMT;  goals;  MD 5/31      Patient will benefit from skilled therapeutic intervention in order to improve the following deficits and impairments:  Abnormal gait, Decreased strength, Impaired flexibility, Pain, Decreased activity tolerance, Decreased endurance, Difficulty walking, Decreased range of motion, Increased edema, Decreased scar mobility, Decreased mobility  Visit Diagnosis: Stiffness of right knee, not elsewhere classified  Muscle weakness (generalized)  Other abnormalities of gait and mobility  Acute pain of right knee  Localized edema     Problem List Patient Active Problem List   Diagnosis Date Noted  . Adjustment disorder with depressed mood 05/30/2013  . Obesity (BMI 30-39.9) 04/23/2013  . RBBB 10/10/2012  . CONSTIPATION 07/28/2010  . LATERAL EPICONDYLITIS 01/12/2010  . LUMBAGO 06/23/2009  . ACTINIC KERATOSIS 03/19/2009  . CATARACT EXTRACTION STATUS 03/19/2009  . Hyperlipidemia 11/01/2008  . 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 UNSPECIFIED HEARING LOSS 11/01/2008  . Essential hypertension 11/01/2008  . ROTATOR CUFF SYNDROME, 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   Ruben Im,  PT 12/09/16 4:53 PM Phone: [phone] Fax: [phone]  Alvera Singh 12/09/2016, 4:53 PM  [facility] [address] [place], [place], [address] Phone: [phone]   Fax:  [phone]  Name: MYKEAL CARRICK MRN: [id number] Date of Birth: 12/13/1945

## 2016-12-14 ENCOUNTER — Ambulatory Visit: Payer: Medicare Other | Admitting: Physical Therapy

## 2016-12-14 DIAGNOSIS — M25661 Stiffness of right knee, not elsewhere classified: Secondary | ICD-10-CM

## 2016-12-14 DIAGNOSIS — M25561 Pain in right knee: Secondary | ICD-10-CM

## 2016-12-14 DIAGNOSIS — M6281 Muscle weakness (generalized): Secondary | ICD-10-CM

## 2016-12-14 DIAGNOSIS — R2689 Other abnormalities of gait and mobility: Secondary | ICD-10-CM

## 2016-12-14 DIAGNOSIS — R600 Localized edema: Secondary | ICD-10-CM

## 2016-12-14 NOTE — Therapy (Signed)
[facility] [address] [place], [place], [address] Phone: [phone]   Fax:  [phone]  Physical Therapy Treatment/Discharge Summary  Patient Details  Name: [place] Smith MRN: [id number] Date of Birth: Jul 04, 1946 Referring Provider: Penelope Coop  Encounter Date: 12/14/2016      PT End of Session - 12/14/16 1013    Visit Number 11   Number of Visits 11   Date for PT Re-Evaluation 12/31/16   Authorization Type medicare g-code 11th visit; kx on 15th visit   PT Start Time 0920   PT Stop Time 1005   PT Time Calculation (min) 45 min   Activity Tolerance Patient tolerated treatment well      Past Medical History:  Diagnosis Date  . Allergic rhinitis   . Arthritis    bilateral knees; has planned partial knee replacement next month  . Genital warts   . History of kidney stones 11/2013   lithotripsy for right ureteral stone  . Hx of colonic polyps    2008  . Hyperlipidemia   . Hypertension   . Right nephrolithiasis     Past Surgical History:  Procedure Laterality Date  . CHOLECYSTECTOMY  2002  . CYSTOSCOPY W/ URETERAL STENT PLACEMENT Right 02/12/2014   Procedure: CYSTOSCOPY WITH RETROGRADE PYELOGRAM/URETERAL STENT PLACEMENT;  Surgeon: Festus Aloe, MD;  Location: [facility];  Service: Urology;  Laterality: Right;  . CYSTOSCOPY WITH RETROGRADE PYELOGRAM, URETEROSCOPY AND STENT PLACEMENT Right 02/12/2014   Procedure: RIGHT URETEROSCOPY ;  Surgeon: Festus Aloe, MD;  Location: [facility];  Service: Urology;  Laterality: Right;  . CYSTOSCOPY WITH URETEROSCOPY AND STENT PLACEMENT Right 03/05/2014   Procedure: CYSTOSCOPY WITH URETEROSCOPY AND STENT EXCHANGE, STONE EXTRACTION;  Surgeon: Festus Aloe, MD;  Location: [facility];  Service: Urology;  Laterality: Right;  . CYSTOSCOPY WITH URETEROSCOPY AND STENT PLACEMENT Right 03/26/2014   Procedure:  CYSTO WITH RIGHT URETEROSCOPY/STENT PLACEMENT;  Surgeon: Festus Aloe, MD;  [facility]: [facility];  Service: Urology;  Laterality: Right;  . EXTRACORPOREAL SHOCK WAVE LITHOTRIPSY Right 12-17-2013  . HOLMIUM LASER APPLICATION Right 8/75/6433   Procedure: HOLMIUM LASER APPLICATION;  Surgeon: Festus Aloe, MD;  Location: [facility]  Service: Urology;  Laterality: Right;  . INGUINAL HERNIA REPAIR Bilateral right 2003/   left 2004  . STONE EXTRACTION WITH BASKET Right 03/26/2014   Procedure: STONE EXTRACTION WITH BASKET;  Surgeon: Festus Aloe, MD;  [facility];  Service: Urology;  Laterality: Right;  . 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 UMBILICAL HERNIA REPAIR  2000    There were no vitals filed for this visit.      Subjective Assessment - 12/14/16 0924    Subjective Feeling ready for discharge.  Some discomfort with descending steps but overall no pain.   I've been to the gym twice and no problem.     Pertinent History Rt TKA 11/02/16, Lt TKA 02/2015   Currently in Pain? No/denies   Pain Score 0-No pain   Pain Location Knee   Pain Orientation Right   Pain Type Surgical pain   Aggravating Factors  coming down the steps            [facility] PT Assessment - 12/14/16 0001      Observation/Other Assessments   Focus on Therapeutic Outcomes (FOTO)  42% limitation     Circumferential Edema   Circumferential - Right 45 cm mid patella   Circumferential -  Left  43.5 cm     AROM   Right Knee Extension 4   Right Knee Flexion 134     Strength   Right Knee Flexion 4/5   Right Knee Extension 4/5                     [facility] Adult PT Treatment/Exercise - 12/14/16 0001      Therapeutic Activites    ADL's discussed upcoming trip on airplane and edema control; circulation strategies     Neuro Re-ed    Neuro Re-ed Details  quad and gluteal activation and dynamic balance      Knee/Hip Exercises: Stretches    Active Hamstring Stretch Right;3 reps;20 seconds  on steps   Quad Stretch Right;3 reps;30 seconds  on steps   Gastroc Stretch Both;2 reps;20 seconds  On slant board     Knee/Hip Exercises: Aerobic   Stationary Bike 10 min L3   Review of status     Knee/Hip Exercises: Standing   Hip ADduction Strengthening;Right;Left;10 reps   Hip ADduction Limitations green band   Hip Extension Stengthening;Right;Left;10 reps   Extension Limitations green band   Other Standing Knee Exercises review of HEP     Knee/Hip Exercises: Seated   Long Arc Quad Strengthening;Right;1 set;10 reps   Long Arc Quad Limitations green band   Hamstring Curl Strengthening;Right;10 reps   Hamstring Limitations green band                  PT Short Term Goals - 12/14/16 1937      PT SHORT TERM GOAL #1   Title be independent in initial HEP   Status Achieved     PT SHORT TERM GOAL #2   Title right knee flexion PROM >/= 110 degrees due to increased mobility   Status Achieved     PT SHORT TERM GOAL #3   Title ambulate with a single point cane due to increased right knee strength >/= 3/5   Status Achieved     PT SHORT TERM GOAL #4   Title pain in right knee with walking decreased >/= 25%   Status Achieved     PT SHORT TERM GOAL #5   Title right knee extension >/= -5 degrees making it easier to walk   Status Achieved           PT Long Term Goals - 12/14/16 0949      PT LONG TERM GOAL #1   Title be independendent in advanced HEP   Status Achieved     PT LONG TERM GOAL #2   Title able to ambulate without an assistive device due to increased right knee strength >/= 4/5   Status Achieved     PT LONG TERM GOAL #3   Title able to go up steps with step over step patter due to increased right knee flexion >/= 115 degrees   Status Achieved     PT LONG TERM GOAL #4   Title right knee extension AROM >/= -5 degrees so he is able to stand to perform daily activities without difficulty   Status  Achieved     PT LONG TERM GOAL #5   Title FOTO score </= 47% limitaiton   Status Achieved               Plan - 12/14/16 1934    Clinical Impression Statement The patient has made excellent progress with knee ROM, strength, return to function and edema reduction.  He is able to walk 1 1/2 miles at a time without an assistive device with minimal limp.  He is able to go up and down steps reciprocally without UE support.  He is independent with a comprehensive HEP and has met rehab goals.  Will discharge from PT at this time.     Recommended Other Services 3rd attempt for signed order resent 5/14 and 2022-12-24      Patient will benefit from skilled therapeutic intervention in order to improve the following deficits and impairments:     Visit Diagnosis: Stiffness of right knee, not elsewhere classified  Muscle weakness (generalized)  Other abnormalities of gait and mobility  Acute pain of right knee  Localized edema       G-Codes - 23-Dec-2016 1937    Functional Assessment Tool Used (Outpatient Only) FOTO   Functional Limitation Mobility: Walking and moving around   Mobility: Walking and Moving Around Goal Status [phone]) At least 40 percent but less than 60 percent impaired, limited or restricted   Mobility: Walking and Moving Around Discharge Status [phone]) At least 20 percent but less than 40 percent impaired, limited or restricted     PHYSICAL THERAPY DISCHARGE SUMMARY  Visits from Start of Care: 11  Current functional level related to goals / functional outcomes: See clinical impressions above  Remaining deficits: As above   Education / Equipment: Comprehensive HEP Plan: Patient agrees to discharge.  Patient goals were met. Patient is being discharged due to meeting the stated rehab goals.  ?????        Problem List Patient Active Problem List   Diagnosis Date Noted  . Adjustment disorder with depressed mood 05/30/2013  . Obesity (BMI 30-39.9) 04/23/2013  .  RBBB 10/10/2012  . CONSTIPATION 07/28/2010  . LATERAL EPICONDYLITIS 01/12/2010  . LUMBAGO 06/23/2009  . ACTINIC KERATOSIS 03/19/2009  . CATARACT EXTRACTION STATUS 03/19/2009  . Hyperlipidemia 11/01/2008  . 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 UNSPECIFIED HEARING LOSS 11/01/2008  . Essential hypertension 11/01/2008  . ROTATOR CUFF SYNDROME, 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   Ruben Im, PT 12-23-2016 7:40 PM Phone: [phone] Fax: [phone]  Alvera Singh 12-23-2016, 7:39 PM  [facility] [address] [place], [place], [address] Phone: [phone]   Fax:  [phone]  Name: [place] Smith MRN: [id number] Date of Birth: Dec 01, 1945

## 2016-12-16 DIAGNOSIS — M25461 Effusion, right knee: Secondary | ICD-10-CM | POA: Diagnosis not present

## 2016-12-16 DIAGNOSIS — Z96651 Presence of right artificial knee joint: Secondary | ICD-10-CM | POA: Diagnosis not present

## 2016-12-16 DIAGNOSIS — I10 Essential (primary) hypertension: Secondary | ICD-10-CM | POA: Diagnosis not present

## 2016-12-16 DIAGNOSIS — Z471 Aftercare following joint replacement surgery: Secondary | ICD-10-CM | POA: Diagnosis not present

## 2016-12-16 DIAGNOSIS — J45909 Unspecified asthma, uncomplicated: Secondary | ICD-10-CM | POA: Diagnosis not present

## 2016-12-16 DIAGNOSIS — Z87891 Personal history of nicotine dependence: Secondary | ICD-10-CM | POA: Diagnosis not present

## 2016-12-17 ENCOUNTER — Encounter: Payer: Medicare Other | Admitting: Physical Therapy

## 2017-01-27 DIAGNOSIS — M25461 Effusion, right knee: Secondary | ICD-10-CM | POA: Diagnosis not present

## 2017-01-27 DIAGNOSIS — Z471 Aftercare following joint replacement surgery: Secondary | ICD-10-CM | POA: Diagnosis not present

## 2017-01-27 DIAGNOSIS — Z96651 Presence of right artificial knee joint: Secondary | ICD-10-CM | POA: Diagnosis not present

## 2017-01-27 DIAGNOSIS — J45909 Unspecified asthma, uncomplicated: Secondary | ICD-10-CM | POA: Diagnosis not present

## 2017-01-27 DIAGNOSIS — Z96653 Presence of artificial knee joint, bilateral: Secondary | ICD-10-CM | POA: Diagnosis not present

## 2017-01-27 DIAGNOSIS — I10 Essential (primary) hypertension: Secondary | ICD-10-CM | POA: Diagnosis not present

## 2017-01-27 DIAGNOSIS — Z87891 Personal history of nicotine dependence: Secondary | ICD-10-CM | POA: Diagnosis not present

## 2017-02-09 DIAGNOSIS — N401 Enlarged prostate with lower urinary tract symptoms: Secondary | ICD-10-CM | POA: Diagnosis not present

## 2017-02-09 DIAGNOSIS — N5201 Erectile dysfunction due to arterial insufficiency: Secondary | ICD-10-CM | POA: Diagnosis not present

## 2017-02-09 DIAGNOSIS — R3911 Hesitancy of micturition: Secondary | ICD-10-CM | POA: Diagnosis not present

## 2017-02-09 DIAGNOSIS — R3912 Poor urinary stream: Secondary | ICD-10-CM | POA: Diagnosis not present

## 2017-02-25 DIAGNOSIS — M4186 Other forms of scoliosis, lumbar region: Secondary | ICD-10-CM | POA: Diagnosis not present

## 2017-02-25 DIAGNOSIS — I10 Essential (primary) hypertension: Secondary | ICD-10-CM | POA: Diagnosis not present

## 2017-02-25 DIAGNOSIS — M4687 Other specified inflammatory spondylopathies, lumbosacral region: Secondary | ICD-10-CM | POA: Diagnosis not present

## 2017-02-25 DIAGNOSIS — M545 Low back pain: Secondary | ICD-10-CM | POA: Diagnosis not present

## 2017-02-25 DIAGNOSIS — M47817 Spondylosis without myelopathy or radiculopathy, lumbosacral region: Secondary | ICD-10-CM | POA: Diagnosis not present

## 2017-02-25 DIAGNOSIS — Z79899 Other long term (current) drug therapy: Secondary | ICD-10-CM | POA: Diagnosis not present

## 2017-02-25 DIAGNOSIS — Z7951 Long term (current) use of inhaled steroids: Secondary | ICD-10-CM | POA: Diagnosis not present

## 2017-02-25 DIAGNOSIS — J45909 Unspecified asthma, uncomplicated: Secondary | ICD-10-CM | POA: Diagnosis not present

## 2017-02-25 DIAGNOSIS — Z87891 Personal history of nicotine dependence: Secondary | ICD-10-CM | POA: Diagnosis not present

## 2017-02-25 DIAGNOSIS — G8929 Other chronic pain: Secondary | ICD-10-CM | POA: Diagnosis not present

## 2017-02-25 DIAGNOSIS — R29890 Loss of height: Secondary | ICD-10-CM | POA: Diagnosis not present

## 2017-03-03 ENCOUNTER — Ambulatory Visit: Payer: Medicare Other | Attending: Nurse Practitioner | Admitting: Physical Therapy

## 2017-03-03 DIAGNOSIS — M545 Low back pain: Secondary | ICD-10-CM | POA: Insufficient documentation

## 2017-03-03 DIAGNOSIS — G8929 Other chronic pain: Secondary | ICD-10-CM | POA: Insufficient documentation

## 2017-03-03 DIAGNOSIS — M6281 Muscle weakness (generalized): Secondary | ICD-10-CM | POA: Insufficient documentation

## 2017-03-14 ENCOUNTER — Encounter: Payer: Self-pay | Admitting: Physical Therapy

## 2017-03-14 ENCOUNTER — Ambulatory Visit: Payer: Medicare Other | Admitting: Physical Therapy

## 2017-03-14 DIAGNOSIS — G8929 Other chronic pain: Secondary | ICD-10-CM

## 2017-03-14 DIAGNOSIS — M6281 Muscle weakness (generalized): Secondary | ICD-10-CM | POA: Diagnosis not present

## 2017-03-14 DIAGNOSIS — M545 Low back pain, unspecified: Secondary | ICD-10-CM

## 2017-03-14 NOTE — Therapy (Signed)
[facility] [address] [place], [place], [address] Phone: [phone]   Fax:  [phone]  Physical Therapy Evaluation  Patient Details  Name: [place] MRN: [id number] Date of Birth: 22-Feb-1946 Referring Provider: Lesle Reek, FNP  Encounter Date: 03/14/2017      PT End of Session - 03/14/17 0916    Visit Number 1   Number of Visits 10   Date for PT Re-Evaluation 05/09/17   Authorization Type medicare g-code 10th visit; KX modifier in on 4th visit due to having therapy previously this year and used 11 visits   PT Start Time 0845   PT Stop Time 0915   PT Time Calculation (min) 30 min   Activity Tolerance Patient tolerated treatment well   Behavior During Therapy [facility] for tasks assessed/performed      Past Medical History:  Diagnosis Date  . Allergic rhinitis   . Arthritis    bilateral knees; has planned partial knee replacement next month  . Genital warts   . History of kidney stones 11/2013   lithotripsy for right ureteral stone  . Hx of colonic polyps    2008  . Hyperlipidemia   . Hypertension   . Right nephrolithiasis     Past Surgical History:  Procedure Laterality Date  . CHOLECYSTECTOMY  2002  . CYSTOSCOPY W/ URETERAL STENT PLACEMENT Right 02/12/2014   Procedure: CYSTOSCOPY WITH RETROGRADE PYELOGRAM/URETERAL STENT PLACEMENT;  Surgeon: Festus Aloe, MD;  Location: [facility];  Service: Urology;  Laterality: Right;  . CYSTOSCOPY WITH RETROGRADE PYELOGRAM, URETEROSCOPY AND STENT PLACEMENT Right 02/12/2014   Procedure: RIGHT URETEROSCOPY ;  Surgeon: Festus Aloe, MD;  Location: [facility];  Service: Urology;  Laterality: Right;  . CYSTOSCOPY WITH URETEROSCOPY AND STENT PLACEMENT Right 03/05/2014   Procedure: CYSTOSCOPY WITH URETEROSCOPY AND STENT EXCHANGE, STONE EXTRACTION;  Surgeon: Festus Aloe, MD;  Location: [facility];   Service: Urology;  Laterality: Right;  . CYSTOSCOPY WITH URETEROSCOPY AND STENT PLACEMENT Right 03/26/2014   Procedure: CYSTO WITH RIGHT URETEROSCOPY/STENT PLACEMENT;  Surgeon: Festus Aloe, MD;  Location: [facility];  Service: Urology;  Laterality: Right;  . EXTRACORPOREAL SHOCK WAVE LITHOTRIPSY Right 12-17-2013  . HOLMIUM LASER APPLICATION Right 4/81/8563   Procedure: HOLMIUM LASER APPLICATION;  Surgeon: Festus Aloe, MD;  Location: [facility];  Service: Urology;  Laterality: Right;  . INGUINAL HERNIA REPAIR Bilateral right 2003/   left 2004  . STONE EXTRACTION WITH BASKET Right 03/26/2014   Procedure: STONE EXTRACTION WITH BASKET;  Surgeon: Festus Aloe, MD;  Location: [facility];  Service: Urology;  Laterality: Right;  . 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 UMBILICAL HERNIA REPAIR  2000    There were no vitals filed for this visit.       Subjective Assessment - 03/14/17 0850    Subjective Patient hurt his back when he was 19 and since then he will bend over to feel better. Over time pain hurts more frequent. Patient last flare-up was in July.  The pain starts when he bends forward to the right.     Diagnostic tests x-ray showed degeneration and arthritis   Patient Stated Goals strengthen core and back; good body mechanics   Currently in Pain? Yes   Pain Score 6    Pain Location Back   Pain Orientation Right   Pain Descriptors / Indicators Aching   Pain Type Chronic pain   Pain Onset  More than a month ago   Pain Frequency Intermittent   Aggravating Factors  stand still 10 min, walking an extended period of time   Pain Relieving Factors sit down for 5 min   Multiple Pain Sites No            [facility] PT Assessment - 03/14/17 0001      Assessment   Medical Diagnosis M54.5; G89.29 Chronic right-sided low back pain without sciatica   Referring Provider Lesle Reek, FNP   Onset Date/Surgical Date 01/16/17    Prior Therapy none     Precautions   Precautions None     Restrictions   Weight Bearing Restrictions No     Balance Screen   Has the patient fallen in the past 6 months No   Has the patient had a decrease in activity level because of a fear of falling?  No   Is the patient reluctant to leave their home because of a fear of falling?  No     Home Ecologist residence     Prior Function   Level of Independence Independent   Vocation Retired     Associate Professor   Overall Cognitive Status Within Functional Limits for tasks assessed     Observation/Other Assessments   Focus on Therapeutic Outcomes (FOTO)  31% limitation  goal is 26% limitation     ROM / Strength   AROM / PROM / Strength AROM;PROM;Strength     AROM   Lumbar Flexion decreased by 25%   Lumbar Extension decreased by 50%   Lumbar - Right Side Bend decreased by 25%   Lumbar - Left Side Bend decreased by 25%     Strength   Overall Strength Comments back strength 3/5   Right Hip Extension 4/5   Left Hip Extension 4/5     Palpation   Palpation comment tightness in bil. lumbar paraspinals and vertebrae            Objective measurements completed on examination: See above findings.                  PT Education - 03/14/17 0915    Education provided Yes   Education Details back stretches   Person(s) Educated Patient   Methods Explanation;Demonstration;Verbal cues;Handout   Comprehension Returned demonstration;Verbalized understanding          PT Short Term Goals - 03/14/17 0923      PT SHORT TERM GOAL #1   Title be independent in initial HEP   Time 4   Period Weeks   Status New   Target Date 04/11/17     PT SHORT TERM GOAL #2   Title understand correct body mechanics for lifting and daily tasks to decrease occurance of lumbar pain   Time 4   Period Weeks   Status New   Target Date 04/11/17     PT SHORT TERM GOAL #3   Title ----     PT SHORT TERM  GOAL #4   Title -----     PT SHORT TERM GOAL #5   Title -----           PT Long Term Goals - 03/14/17 0909      PT LONG TERM GOAL #1   Title be independendent in advanced HEP   Time 8   Period Weeks   Status New   Target Date 05/09/17     PT LONG TERM GOAL #2   Title  walking for 45 min to hike with reduction of pain >/= 50%   Time 8   Period Weeks   Status New   Target Date 05/09/17     PT LONG TERM GOAL #3   Title mow the lawn with pain decreased >/= 50% due to improved strength and mobility   Time 8   Period Weeks   Status New   Target Date 05/09/17     PT LONG TERM GOAL #4   Title standing for 20 min. with lumbar pain decreased >/= 50% due to increased lumbar strength to stand at a party   Time 8   Period Weeks   Status New   Target Date 05/09/17     PT LONG TERM GOAL #5   Title FOTO score </= 26% limitaiton   Time 8   Period Weeks   Status New   Target Date 05/09/17                Plan - 03/14/17 7035    Clinical Impression Statement Patient is a 71 year old male with chronic back pain.  Patient last flare-up is 01/16/2017 when he bends foward to the right.  Patient reports his pain is a 6/10 when he walks and  stands.  Patient has decreased ROM of lumbar by 25% except for extension decreased by 50%.  Patient has weakness in lumbar paraspinals and bil. hip extension.  Patient has tight hamstrings and quads.  Patient will benefit from skilled therapy to improve strength of core and back extensors so he is able to walk and stand for longer periods of time.    History and Personal Factors relevant to plan of care: none   Clinical Presentation Stable   Clinical Presentation due to: stable condition   Clinical Decision Making Low   Rehab Potential Excellent   Clinical Impairments Affecting Rehab Potential None   PT Frequency 2x / week   PT Duration 8 weeks   PT Treatment/Interventions Moist Heat;Ultrasound;Traction;Cryotherapy;Electrical  Stimulation;Therapeutic activities;Therapeutic exercise;Neuromuscular re-education;Patient/family education;Manual techniques;Dry needling   PT Next Visit Plan back strengthenind for extension; abdominal strength; bil. hip extensor strength; body mechanics with daily tasks   PT Home Exercise Plan progress as needed   Consulted and Agree with Plan of Care Patient      Patient will benefit from skilled therapeutic intervention in order to improve the following deficits and impairments:  Decreased strength, Decreased mobility, Pain  Visit Diagnosis: Chronic right-sided low back pain without sciatica - Plan: PT plan of care cert/re-cert  Muscle weakness (generalized) - Plan: PT plan of care cert/re-cert      G-Codes - 00/93/81 0914    Functional Assessment Tool Used (Outpatient Only) FOTO score is 31% limitation  goal is 26% limitation   Functional Limitation Other PT primary   Other PT Primary Current Status (W2993) At least 20 percent but less than 40 percent impaired, limited or restricted   Other PT Primary Goal Status (Z1696) At least 20 percent but less than 40 percent impaired, limited or restricted       Problem List Patient Active Problem List   Diagnosis Date Noted  . Adjustment disorder with depressed mood 05/30/2013  . Obesity (BMI 30-39.9) 04/23/2013  . RBBB 10/10/2012  . CONSTIPATION 07/28/2010  . LATERAL EPICONDYLITIS 01/12/2010  . LUMBAGO 06/23/2009  . ACTINIC KERATOSIS 03/19/2009  . CATARACT EXTRACTION STATUS 03/19/2009  . Hyperlipidemia 11/01/2008  . 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 UNSPECIFIED HEARING LOSS 11/01/2008  . Essential hypertension  11/01/2008  . ROTATOR CUFF SYNDROME, 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    Earlie Counts, PT 03/14/17 9:27 AM    [facility] [address] [place], [place], [address] Phone: [phone]   Fax:  [phone]  Name: RALPHAEL SOUTHGATE MRN: [id number] Date of Birth: [birth year]

## 2017-03-14 NOTE — Patient Instructions (Addendum)
   Start in a supine position, one leg bent with foot flat on the bed/floor, and the other leg crossed over the knee.  Gently push the knee of the crossed leg forward until a strong yet pain-free stretch is felt.  Hold for 30 seconds then release.  Repeat as many sets as instructed, then switch sides and repeat. Quads / HF, Side-Lying    Lie on one side, legs bent. Hold foot of top leg with same-side hand. Raise leg. Hold _30__ seconds.  Repeat __2_ times per session. Do _1__ sessions per day.  Copyright  VHI. All rights reserved.  Supine Knee-to-Chest, Unilateral    Lie on back, hands clasped behind one knee. Pull knee in toward chest until a comfortable stretch is felt in lower back and buttocks. Hold _30__ seconds.  Repeat _2__ times per session. Do _1__ sessions per day.  Copyright  VHI. All rights reserved.  Supine Knee-to-Chest, Bilateral    Lie on back, hands clasped behind both knees. Pull knees in toward chest until a comfortable stretch is felt in lower back and buttocks. Hold _30__ seconds. Repeat _2__ times per session. Do _1__ sessions per day.  Copyright  VHI. All rights reserved.  [facility] [address], [place], [place] [address] Phone # [phone] Fax [phone]

## 2017-03-16 DIAGNOSIS — N401 Enlarged prostate with lower urinary tract symptoms: Secondary | ICD-10-CM | POA: Diagnosis not present

## 2017-03-16 DIAGNOSIS — R3912 Poor urinary stream: Secondary | ICD-10-CM | POA: Diagnosis not present

## 2017-03-16 DIAGNOSIS — R3911 Hesitancy of micturition: Secondary | ICD-10-CM | POA: Diagnosis not present

## 2017-03-17 ENCOUNTER — Encounter: Payer: Self-pay | Admitting: Physical Therapy

## 2017-03-17 ENCOUNTER — Ambulatory Visit: Payer: Medicare Other | Admitting: Physical Therapy

## 2017-03-17 DIAGNOSIS — M545 Low back pain, unspecified: Secondary | ICD-10-CM

## 2017-03-17 DIAGNOSIS — G8929 Other chronic pain: Secondary | ICD-10-CM | POA: Diagnosis not present

## 2017-03-17 DIAGNOSIS — M6281 Muscle weakness (generalized): Secondary | ICD-10-CM

## 2017-03-17 NOTE — Patient Instructions (Addendum)
Abdominal Bracing With Pelvic Floor (Hook-Lying)    With neutral spine, tighten pelvic floor and abdominals. Hold for 5 sec. Repeat _5__ times. Do _1__ times a day.   Copyright  VHI. All rights reserved.    Bracing With Knee Fallout (Hook-Lying)    With neutral spine, tighten pelvic floor and abdominals and hold. Alternating legs, drop knee out to side. Keep opposite hip still. Repeat _10__ times. Do _1__ times a day.   Copyright  VHI. All rights reserved.  Bracing With Leg March (Hook-Lying)    With neutral spine, tighten pelvic floor and abdominals and hold. Alternating legs, lift foot _6__ inches and return to floor. Repeat _10__ times. Do _1__ times a day.   Copyright  VHI. All rights reserved.  Bracing With Bridging (Hook-Lying)    With neutral spine, tighten pelvic floor and abdominals and hold. Lift bottom. Repeat _10__ times. Do __1_ times a day.   Copyright  VHI. All rights reserved.  [facility] [address], [place], [place] [address] Phone # [phone] Fax [phone]

## 2017-03-17 NOTE — Therapy (Signed)
[facility] [address], [place], [place], [address] Phone: [phone]   Fax:  [phone]  Physical Therapy Treatment  Patient Details  Name: Lee Smith MRN: [id number] Date of Birth: [birth year] Referring Provider: Lesle Reek, FNP  Encounter Date: 03/17/2017      PT End of Session - 03/17/17 0839    Visit Number 2   Number of Visits 10   Date for PT Re-Evaluation 05/09/17   Authorization Type medicare g-code 10th visit; KX modifier in on 4th visit due to having therapy previously this year and used 11 visits   PT Start Time 0800   PT Stop Time 0840   PT Time Calculation (min) 40 min   Activity Tolerance Patient tolerated treatment well   Behavior During Therapy [facility] for tasks assessed/performed      Past Medical History:  Diagnosis Date  . Allergic rhinitis   . Arthritis    bilateral knees; has planned partial knee replacement next month  . Genital warts   . History of kidney stones 11/2013   lithotripsy for right ureteral stone  . Hx of colonic polyps    2008  . Hyperlipidemia   . Hypertension   . Right nephrolithiasis     Past Surgical History:  Procedure Laterality Date  . CHOLECYSTECTOMY  2002  . CYSTOSCOPY W/ URETERAL STENT PLACEMENT Right 02/12/2014   Procedure: CYSTOSCOPY WITH RETROGRADE PYELOGRAM/URETERAL STENT PLACEMENT;  Surgeon: Festus Aloe, MD;  Location: [facility];  Service: Urology;  Laterality: Right;  . CYSTOSCOPY WITH RETROGRADE PYELOGRAM, URETEROSCOPY AND STENT PLACEMENT Right 02/12/2014   Procedure: RIGHT URETEROSCOPY ;  Surgeon: Festus Aloe, MD;  Location: [facility];  Service: Urology;  Laterality: Right;  . CYSTOSCOPY WITH URETEROSCOPY AND STENT PLACEMENT Right 03/05/2014   Procedure: CYSTOSCOPY WITH URETEROSCOPY AND STENT EXCHANGE, STONE EXTRACTION;  Surgeon: Festus Aloe, MD;  Location: [facility];   Service: Urology;  Laterality: Right;  . CYSTOSCOPY WITH URETEROSCOPY AND STENT PLACEMENT Right 03/26/2014   Procedure: CYSTO WITH RIGHT URETEROSCOPY/STENT PLACEMENT;  Surgeon: Festus Aloe, MD;  Location: [facility];  Service: Urology;  Laterality: Right;  . EXTRACORPOREAL SHOCK WAVE LITHOTRIPSY Right 12-17-2013  . HOLMIUM LASER APPLICATION Right 0/97/3532   Procedure: HOLMIUM LASER APPLICATION;  Surgeon: Festus Aloe, MD;  Location: [facility];  Service: Urology;  Laterality: Right;  . INGUINAL HERNIA REPAIR Bilateral right 2003/   left 2004  . STONE EXTRACTION WITH BASKET Right 03/26/2014   Procedure: STONE EXTRACTION WITH BASKET;  Surgeon: Festus Aloe, MD;  Location: [facility];  Service: Urology;  Laterality: Right;  . 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 UMBILICAL HERNIA REPAIR  2000    There were no vitals filed for this visit.      Subjective Assessment - 03/17/17 0804    Subjective My back feels good today but I had some pain yesterday.  The pain was just in the back.    Diagnostic tests x-ray showed degeneration and arthritis   Patient Stated Goals strengthen core and back; good body mechanics   Currently in Pain? Yes   Pain Score 4    Pain Location Back   Pain Orientation Right   Pain Type Chronic pain   Pain Radiating Towards goes into the right buttocks   Pain Onset More than a month ago   Pain Frequency Intermittent   Aggravating Factors  stand still 10 min; walking an  extended period of time   Pain Relieving Factors sit down for 5 min   Multiple Pain Sites No                         [facility] Adult PT Treatment/Exercise - 03/17/17 0001      Neuro Re-ed    Neuro Re-ed Details  sit on blue physioball- bouncing, pelvic sway, pelvic circles, diagonals, alternate shoulder flexion     Exercises   Exercises Lumbar     Lumbar Exercises: Stretches   Active Hamstring Stretch 30 seconds;2 reps   bil. in sitting   Piriformis Stretch 30 seconds;1 rep  bil. sitting     Lumbar Exercises: Standing   Scapular Retraction Power Tower;Strengthening;15 reps   Scapular Retraction Limitations 25# VC to squeeze scapula   Shoulder Extension Power Tower;Strengthening;Both;15 reps   Shoulder Extension Limitations 15# VC to squeeze scapulas     Lumbar Exercises: Supine   Ab Set 5 reps;5 seconds     Lumbar Exercises: Prone   Opposite Arm/Leg Raise 10 reps;Left arm/Right leg;Right arm/Left leg  leaning over high mat                PT Education - 03/17/17 0838    Education provided Yes   Education Details core stabilization   Person(s) Educated Patient   Methods Explanation;Demonstration;Verbal cues;Handout   Comprehension Returned demonstration;Verbalized understanding          PT Short Term Goals - 03/14/17 0923      PT SHORT TERM GOAL #1   Title be independent in initial HEP   Time 4   Period Weeks   Status New   Target Date 04/11/17     PT SHORT TERM GOAL #2   Title understand correct body mechanics for lifting and daily tasks to decrease occurance of lumbar pain   Time 4   Period Weeks   Status New   Target Date 04/11/17     PT SHORT TERM GOAL #3   Title ----     PT SHORT TERM GOAL #4   Title -----     PT SHORT TERM GOAL #5   Title -----           PT Long Term Goals - 03/14/17 0909      PT LONG TERM GOAL #1   Title be independendent in advanced HEP   Time 8   Period Weeks   Status New   Target Date 05/09/17     PT LONG TERM GOAL #2   Title walking for 45 min to hike with reduction of pain >/= 50%   Time 8   Period Weeks   Status New   Target Date 05/09/17     PT LONG TERM GOAL #3   Title mow the lawn with pain decreased >/= 50% due to improved strength and mobility   Time 8   Period Weeks   Status New   Target Date 05/09/17     PT LONG TERM GOAL #4   Title standing for 20 min. with lumbar pain decreased >/= 50% due to increased  lumbar strength to stand at a party   Time 8   Period Weeks   Status New   Target Date 05/09/17     PT LONG TERM GOAL #5   Title FOTO score </= 26% limitaiton   Time 8   Period Weeks   Status New   Target Date 05/09/17  Plan - 03/17/17 0807    Clinical Impression Statement Patient is able to engage his core correctly.  Patient is able to separate his lumbar movement and keep his shoulders steady when on the physioball. Patient was not having pain today.  Patient has tightness in lumbar paraspinals.  Patient will benfit from skilled therapy to improve skilled strength of core and back extensors so he si able to walk and stand for long periods of time.    Rehab Potential Excellent   Clinical Impairments Affecting Rehab Potential None   PT Frequency 2x / week   PT Duration 8 weeks   PT Treatment/Interventions Moist Heat;Ultrasound;Traction;Cryotherapy;Electrical Stimulation;Therapeutic activities;Therapeutic exercise;Neuromuscular re-education;Patient/family education;Manual techniques;Dry needling   PT Next Visit Plan back strengthenind for extension; abdominal strength; bil. hip extensor strength; body mechanics with daily tasks   PT Home Exercise Plan body mechanics with daily tasks   Consulted and Agree with Plan of Care Patient      Patient will benefit from skilled therapeutic intervention in order to improve the following deficits and impairments:  Decreased strength, Decreased mobility, Pain  Visit Diagnosis: Chronic right-sided low back pain without sciatica  Muscle weakness (generalized)     Problem List Patient Active Problem List   Diagnosis Date Noted  . Adjustment disorder with depressed mood 05/30/2013  . Obesity (BMI 30-39.9) 04/23/2013  . RBBB 10/10/2012  . CONSTIPATION 07/28/2010  . LATERAL EPICONDYLITIS 01/12/2010  . LUMBAGO 06/23/2009  . ACTINIC KERATOSIS 03/19/2009  . CATARACT EXTRACTION STATUS 03/19/2009  . Hyperlipidemia  11/01/2008  . 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 UNSPECIFIED HEARING LOSS 11/01/2008  . Essential hypertension 11/01/2008  . ROTATOR CUFF SYNDROME, 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    Earlie Counts, PT 03/17/17 8:42 AM   [facility] [address] [place], [place], [address] Phone: [phone]   Fax:  [phone]  Name: Lee Smith MRN: [id number] Date of Birth: July 30, 1945

## 2017-03-22 ENCOUNTER — Encounter: Payer: Self-pay | Admitting: Physical Therapy

## 2017-03-22 ENCOUNTER — Ambulatory Visit: Payer: Medicare Other | Attending: Nurse Practitioner | Admitting: Physical Therapy

## 2017-03-22 DIAGNOSIS — G8929 Other chronic pain: Secondary | ICD-10-CM | POA: Diagnosis not present

## 2017-03-22 DIAGNOSIS — M545 Low back pain, unspecified: Secondary | ICD-10-CM

## 2017-03-22 DIAGNOSIS — M6281 Muscle weakness (generalized): Secondary | ICD-10-CM

## 2017-03-22 NOTE — Therapy (Signed)
[facility] [address], [place], [place], [address] Phone: [phone]   Fax:  [phone]  Physical Therapy Treatment  Patient Details  Name: Lee Smith MRN: [id number] Date of Birth: 1945/11/25 Referring Provider: Lesle Reek, FNP  Encounter Date: 03/22/2017      PT End of Session - 03/22/17 0850    Visit Number 3   Number of Visits 10   Date for PT Re-Evaluation 05/09/17   Authorization Type medicare g-code 10th visit; KX modifier in on 4th visit due to having therapy previously this year and used 11 visits   PT Start Time 0846   PT Stop Time 0925   PT Time Calculation (min) 39 min   Activity Tolerance Patient tolerated treatment well   Behavior During Therapy [facility] for tasks assessed/performed      Past Medical History:  Diagnosis Date  . Allergic rhinitis   . Arthritis    bilateral knees; has planned partial knee replacement next month  . Genital warts   . History of kidney stones 11/2013   lithotripsy for right ureteral stone  . Hx of colonic polyps    2008  . Hyperlipidemia   . Hypertension   . Right nephrolithiasis     Past Surgical History:  Procedure Laterality Date  . CHOLECYSTECTOMY  2002  . CYSTOSCOPY W/ URETERAL STENT PLACEMENT Right 02/12/2014   Procedure: CYSTOSCOPY WITH RETROGRADE PYELOGRAM/URETERAL STENT PLACEMENT;  Surgeon: Festus Aloe, MD;  Location: [facility];  Service: Urology;  Laterality: Right;  . CYSTOSCOPY WITH RETROGRADE PYELOGRAM, URETEROSCOPY AND STENT PLACEMENT Right 02/12/2014   Procedure: RIGHT URETEROSCOPY ;  Surgeon: Festus Aloe, MD;  Location: [facility];  Service: Urology;  Laterality: Right;  . CYSTOSCOPY WITH URETEROSCOPY AND STENT PLACEMENT Right 03/05/2014   Procedure: CYSTOSCOPY WITH URETEROSCOPY AND STENT EXCHANGE, STONE EXTRACTION;  Surgeon: Festus Aloe, MD;  Location: [facility];   Service: Urology;  Laterality: Right;  . CYSTOSCOPY WITH URETEROSCOPY AND STENT PLACEMENT Right 03/26/2014   Procedure: CYSTO WITH RIGHT URETEROSCOPY/STENT PLACEMENT;  Surgeon: Festus Aloe, MD;  Location: [facility];  Service: Urology;  Laterality: Right;  . EXTRACORPOREAL SHOCK WAVE LITHOTRIPSY Right 12-17-2013  . HOLMIUM LASER APPLICATION Right 12/04/8414   Procedure: HOLMIUM LASER APPLICATION;  Surgeon: Festus Aloe, MD;  Location: [facility];  Service: Urology;  Laterality: Right;  . INGUINAL HERNIA REPAIR Bilateral right 2003/   left 2004  . STONE EXTRACTION WITH BASKET Right 03/26/2014   Procedure: STONE EXTRACTION WITH BASKET;  Surgeon: Festus Aloe, MD;  Location: [facility];  Service: Urology;  Laterality: Right;  . 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 UMBILICAL HERNIA REPAIR  2000    There were no vitals filed for this visit.      Subjective Assessment - 03/22/17 0849    Subjective I feel good. Stiffness in the morning. I mowed the yard and some low right side pain and sat to rest.    Diagnostic tests x-ray showed degeneration and arthritis   Patient Stated Goals strengthen core and back; good body mechanics   Currently in Pain? No/denies   Multiple Pain Sites No                         [facility] Adult PT Treatment/Exercise - 03/22/17 0001      Lumbar Exercises: Stretches   Active Hamstring Stretch 30 seconds;2 reps  bil.  in sitting   Piriformis Stretch 30 seconds;1 rep  bil. sitting     Lumbar Exercises: Prone   Opposite Arm/Leg Raise 10 reps;Left arm/Right leg;Right arm/Left leg  leaning over high mat                PT Education - 03/22/17 0924    Education provided Yes   Education Details body mechanics with daily tasks; back extensor strength with green theraband   Person(s) Educated Patient   Methods Explanation;Demonstration;Verbal cues;Handout   Comprehension Verbalized  understanding;Returned demonstration          PT Short Term Goals - 03/22/17 0850      PT SHORT TERM GOAL #1   Title be independent in initial HEP   Time 4   Period Weeks   Status Achieved     PT SHORT TERM GOAL #2   Title understand correct body mechanics for lifting and daily tasks to decrease occurance of lumbar pain   Time 4   Period Weeks   Status Achieved           PT Long Term Goals - 03/14/17 0909      PT LONG TERM GOAL #1   Title be independendent in advanced HEP   Time 8   Period Weeks   Status New   Target Date 05/09/17     PT LONG TERM GOAL #2   Title walking for 45 min to hike with reduction of pain >/= 50%   Time 8   Period Weeks   Status New   Target Date 05/09/17     PT LONG TERM GOAL #3   Title mow the lawn with pain decreased >/= 50% due to improved strength and mobility   Time 8   Period Weeks   Status New   Target Date 05/09/17     PT LONG TERM GOAL #4   Title standing for 20 min. with lumbar pain decreased >/= 50% due to increased lumbar strength to stand at a party   Time 8   Period Weeks   Status New   Target Date 05/09/17     PT LONG TERM GOAL #5   Title FOTO score </= 26% limitaiton   Time 8   Period Weeks   Status New   Target Date 05/09/17               Plan - 03/22/17 0854    Clinical Impression Statement Patient is only having stiffness in the morning.  Patient has information to manage his pain in standing and sitting with pelvic rock. Patient understands correct body mechanics with daily activities to manage his back pain.  Patient will benefit from skilled therapy to reduce pain and improve strength.    Clinical Impairments Affecting Rehab Potential None   PT Duration 8 weeks   PT Treatment/Interventions Moist Heat;Ultrasound;Traction;Cryotherapy;Electrical Stimulation;Therapeutic activities;Therapeutic exercise;Neuromuscular re-education;Patient/family education;Manual techniques;Dry needling   PT Next Visit  Plan reassess to see if ready for discharge; review HEP   PT Home Exercise Plan progress as needed   Consulted and Agree with Plan of Care Patient      Patient will benefit from skilled therapeutic intervention in order to improve the following deficits and impairments:  Decreased strength, Decreased mobility, Pain  Visit Diagnosis: Chronic right-sided low back pain without sciatica  Muscle weakness (generalized)     Problem List Patient Active Problem List   Diagnosis Date Noted  . Adjustment disorder with depressed mood 05/30/2013  . Obesity (  BMI 30-39.9) 04/23/2013  . RBBB 10/10/2012  . CONSTIPATION 07/28/2010  . LATERAL EPICONDYLITIS 01/12/2010  . LUMBAGO 06/23/2009  . ACTINIC KERATOSIS 03/19/2009  . CATARACT EXTRACTION STATUS 03/19/2009  . Hyperlipidemia 11/01/2008  . 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 UNSPECIFIED HEARING LOSS 11/01/2008  . Essential hypertension 11/01/2008  . ROTATOR CUFF SYNDROME, 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    Earlie Counts, PT 03/22/17 9:27 AM   [facility] [address], [place], [place], [address] Phone: [phone]   Fax:  [phone]  Name: Lee Smith MRN: [id number] Date of Birth: 08-Nov-1945

## 2017-03-22 NOTE — Patient Instructions (Addendum)
Posture - Sitting    Sit upright, head facing forward. Try using a roll to support lower back. Keep shoulders relaxed, and avoid rounded back. Keep hips level with knees. Avoid crossing legs for long periods.  Rock pelvis back and forth when sitting for a long period of time.  Copyright  VHI. All rights reserved.  Posture - Standing    Good posture is important. Avoid slouching and forward head thrust. Maintain curve in low back and align ears over shoul- ders, hips over ankles. When back aches move the pelvis back and forth and side to side.   Copyright  VHI. All rights reserved.  Standing    For prolonged standing, alternate placing one foot in front of the other or on a stool. Wear low-heeled shoes, and maintain good posture.   Copyright  VHI. All rights reserved.  Bending    Bend at hips and knees, not back. Keep feet shoulder-width apart.   Copyright  VHI. All rights reserved.  Avoid Twisting    Avoid twisting or bending back. Pivot around using foot movements, and bend at knees if needed when reaching for articles.  Especially to the right.  Copyright  VHI. All rights reserved.  Planning Ahead    Don't rush! Give yourself time to set up proper work conditions, and carry out good body mechanics.   Copyright  VHI. All rights reserved.  Pushing / Pulling    Pushing is preferable to pulling. Keep back in proper alignment, and use leg muscles to do the work.   Copyright  VHI. All rights reserved.  Moving Objects    Keep elbows close at sides, and use total body weight and legs to push or pull.   Copyright  VHI. All rights reserved.  Getting Into / Out of Car    Lower self onto seat, scoot back, then bring in one leg at a time. Reverse sequence to get out.   Copyright  VHI. All rights reserved.  Brushing Teeth     Place one foot on ledge and one hand on counter. Bend other knee slightly to keep back straight.  Copyright  VHI.  All rights reserved.  Gardening - Mowing    Keep arms close to sides and walk with lawn mower.   Copyright  VHI. All rights reserved.  Gardening - Weeding / Engineer, maintenance or kneel. Knee pads may be helpful.   Copyright  VHI. All rights reserved.  Deep Squat    Squat and lift with both arms held against upper trunk. Tighten stomach muscles without holding breath. Use smooth movements to avoid jerking.  Copyright  VHI. All rights reserved.    Start in the plank position (seen previously). Lift one arm and the opposing leg, keeping your core position steady. As you switch to the opposite arm and leg, keep your core steady and do not arch the back as you switch. Keep your body weight forward so that your shoulders are over your wrists, not behind them. 10 times each way.  1 time per day.     While lying on your back, tighten your lower abdominals, squeeze your buttocks and then raise your buttocks off the floor/bed as creating a "Bridge" with your body.   While holding this position, lift one leg while maintaining a level pelvis. Set it back to the floor and then lift the opposite leg.  10 times 1 time per day.   Bracing With Arms / Legs (Hook-Lying)  With neutral spine, tighten pelvic floor and abdominals and hold. Raise arm and opposite leg, then return. Repeat wtih other limbs. Repeat _10__ times. Do __1_ times a day.   Copyright  VHI. All rights reserved.  Calf stretch hold 30 seconds 1-2 times per leg.   Scapular Retraction: Bilateral    Facing anchor, pull arms back, bringing shoulder blades together. Repeat __20__ times per set. Do _1___ sets per session. Do _1___ sessions per day.  http://orth.exer.us/176   Copyright  VHI. All rights reserved.    Attach the [facility] [employer] handles securely to a door using S-Biner Clips and Velcro straps. Face the door, maintain abdominal bracing to stabilize the core, elbows extended holding [employer] handles,  keeping knees relaxed, shoulders down. Slowly bring arms back into extension behind hips keeping arms straight and thumbs up. Then return to first position. (SH-7)  20 times. 1 time per week  Resisted Shoulder Flexion: Bilateral    Face anchor, tubing end in each hand. With arms straight out in front, pinch shoulder blades together and raise arms over head. Repeat _20___ times per set. Do ___1_ sets per session. Do __1__ sessions per day.  http://orth.exer.us/964   Copyright  VHI. All rights reserved.  Resisted Horizontal Abduction: Bilateral    Sit or stand, tubing in both hands, arms out in front. Keeping arms straight, pinch shoulder blades together and stretch arms out. Repeat __20__ times per set. Do ___1_ sets per session. Do _1___ sessions per day.  http://orth.exer.us/968   Copyright  VHI. All rights reserved.  [place] [address], [place], [place] [address] Phone # [phone] Fax [phone]

## 2017-03-23 ENCOUNTER — Encounter: Payer: Medicare Other | Admitting: Physical Therapy

## 2017-03-23 ENCOUNTER — Other Ambulatory Visit: Payer: Self-pay | Admitting: Family Medicine

## 2017-04-01 ENCOUNTER — Ambulatory Visit: Payer: Medicare Other | Admitting: Physical Therapy

## 2017-04-01 DIAGNOSIS — M545 Low back pain, unspecified: Secondary | ICD-10-CM

## 2017-04-01 DIAGNOSIS — G8929 Other chronic pain: Secondary | ICD-10-CM

## 2017-04-01 DIAGNOSIS — M6281 Muscle weakness (generalized): Secondary | ICD-10-CM

## 2017-04-01 NOTE — Therapy (Addendum)
[facility] [address], [place], [place], [address] Phone: [phone]   Fax:  [phone]  Physical Therapy Treatment/Discharge Summary  Patient Details  Name: Lee Smith MRN: [id number] Date of Birth: 1945/12/11 Referring Provider: Lesle Reek, FNP  Encounter Date: 04/01/2017      PT End of Session - 04/01/17 0906    Visit Number 4   Number of Visits 10   Date for PT Re-Evaluation 05/09/17   Authorization Type medicare g-code 10th visit; KX modifier in on 4th visit due to having therapy previously this year and used 11 visits   PT Start Time 0836   PT Stop Time 0902   PT Time Calculation (min) 26 min   Activity Tolerance Patient tolerated treatment well      Past Medical History:  Diagnosis Date  . Allergic rhinitis   . Arthritis    bilateral knees; has planned partial knee replacement next month  . Genital warts   . History of kidney stones 11/2013   lithotripsy for right ureteral stone  . Hx of colonic polyps    2008  . Hyperlipidemia   . Hypertension   . Right nephrolithiasis     Past Surgical History:  Procedure Laterality Date  . CHOLECYSTECTOMY  2002  . CYSTOSCOPY W/ URETERAL STENT PLACEMENT Right 02/12/2014   Procedure: CYSTOSCOPY WITH RETROGRADE PYELOGRAM/URETERAL STENT PLACEMENT;  Surgeon: Festus Aloe, MD;  Location: [facility];  Service: Urology;  Laterality: Right;  . CYSTOSCOPY WITH RETROGRADE PYELOGRAM, URETEROSCOPY AND STENT PLACEMENT Right 02/12/2014   Procedure: RIGHT URETEROSCOPY ;  Surgeon: Festus Aloe, MD;  Location: [facility];  Service: Urology;  Laterality: Right;  . CYSTOSCOPY WITH URETEROSCOPY AND STENT PLACEMENT Right 03/05/2014   Procedure: CYSTOSCOPY WITH URETEROSCOPY AND STENT EXCHANGE, STONE EXTRACTION;  Surgeon: Festus Aloe, MD;  Location: [facility];  Service: Urology;  Laterality: Right;  .  CYSTOSCOPY WITH URETEROSCOPY AND STENT PLACEMENT Right 03/26/2014   Procedure: CYSTO WITH RIGHT URETEROSCOPY/STENT PLACEMENT;  Surgeon: Festus Aloe, MD;  [facility];  Service: Urology;  Laterality: Right;  . EXTRACORPOREAL SHOCK WAVE LITHOTRIPSY Right 12-17-2013  . HOLMIUM LASER APPLICATION Right 8/58/8502   Procedure: HOLMIUM LASER APPLICATION;  Surgeon: Festus Aloe, MD;  Location: [facility];  Service: Urology;  Laterality: Right;  . INGUINAL HERNIA REPAIR Bilateral right 2003/   left 2004  . STONE EXTRACTION WITH BASKET Right 03/26/2014   Procedure: STONE EXTRACTION WITH BASKET;  Surgeon: Festus Aloe, MD;  Location: [facility];  Service: Urology;  Laterality: Right;  . 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 UMBILICAL HERNIA REPAIR  2000    There were no vitals filed for this visit.      Subjective Assessment - 04/01/17 0835    Subjective Back is doing better.  I'm ready to finish up today.  Just a little tight spot.  Reports good understanding of HEP.   Currently in Pain? No/denies   Pain Score 0-No pain   Pain Location Back   Aggravating Factors  bend over to the right, standing   Pain Relieving Factors sitting; bending over straight            [facility] PT Assessment - 04/01/17 0001      Observation/Other Assessments   Focus on Therapeutic Outcomes (FOTO)  6% limitation     AROM   Lumbar Flexion 65   Lumbar Extension 20   Lumbar -  Right Side Bend 20   Lumbar - Left Side Bend 20     Strength   Overall Strength Comments 4-/5 abdominal and trunk extensor strength   Right Hip Extension 4/5   Left Hip Extension 4/5     Flexibility   Soft Tissue Assessment /Muscle Length yes   Hamstrings 70 degrees bil                     [facility] Adult PT Treatment/Exercise - Apr 24, 2017 0001      Therapeutic Activites    Therapeutic Activities --  discussed flexion bias ex program   ADL's Discussion of  propping against counter, prop on bar stool, foot prop for standing    Other Therapeutic Activities decompression in sitting or lying after prolonged standing/walking     Exercises   Exercises --  review of HEP including abdominal brace importance, HS                   PT Short Term Goals - Apr 24, 2017 0902      PT SHORT TERM GOAL #1   Title be independent in initial HEP   Status Achieved     PT SHORT TERM GOAL #2   Title understand correct body mechanics for lifting and daily tasks to decrease occurance of lumbar pain   Status Achieved           PT Long Term Goals - 2017-04-24 0856      PT LONG TERM GOAL #1   Title be independendent in advanced HEP   Status Achieved     PT LONG TERM GOAL #2   Title walking for 45 min to hike with reduction of pain >/= 50%   Status Achieved     PT LONG TERM GOAL #3   Title mow the lawn with pain decreased >/= 50% due to improved strength and mobility   Status Achieved     PT LONG TERM GOAL #4   Title standing for 20 min. with lumbar pain decreased >/= 50% due to increased lumbar strength to stand at a party   Status Partially Met     PT LONG TERM GOAL #5   Title FOTO score </= 26% limitaiton   Status Achieved               Plan - 24-Apr-2017 0907    Clinical Impression Statement The patient reports he is feeling much better.  He is now able to mow the lawn for 45 min and not have to stop multiple times.  He is independent in a HEP with a flexion bias and neutral spine stabilization.  Discussed strategies to help him stand longer in one spot.  The patient expresses for readiness for discharge from PT at this time.  Majority of goals met.        Patient will benefit from skilled therapeutic intervention in order to improve the following deficits and impairments:     Visit Diagnosis: Chronic right-sided low back pain without sciatica  Muscle weakness (generalized)       G-Codes - 04/24/17 0912    Functional  Assessment Tool Used (Outpatient Only) FOTO   Functional Limitation Other PT primary   Other PT Primary Goal Status (J4970) At least 20 percent but less than 40 percent impaired, limited or restricted   Other PT Primary Discharge Status (Y6378) At least 20 percent but less than 40 percent impaired, limited or restricted     PHYSICAL THERAPY DISCHARGE SUMMARY  Visits from Start of Care: 4  Current functional level related to goals / functional outcomes: See clinical impressions above   Remaining deficits: As above   Education / Equipment: HEP Plan: Patient agrees to discharge.  Patient goals were partially met. Patient is being discharged due to being pleased with the current functional level.  ?????        Problem List Patient Active Problem List   Diagnosis Date Noted  . Adjustment disorder with depressed mood 05/30/2013  . Obesity (BMI 30-39.9) 04/23/2013  . RBBB 10/10/2012  . CONSTIPATION 07/28/2010  . LATERAL EPICONDYLITIS 01/12/2010  . LUMBAGO 06/23/2009  . ACTINIC KERATOSIS 03/19/2009  . CATARACT EXTRACTION STATUS 03/19/2009  . Hyperlipidemia 11/01/2008  . 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 UNSPECIFIED HEARING LOSS 11/01/2008  . Essential hypertension 11/01/2008  . ROTATOR CUFF SYNDROME, 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   Ruben Im, PT 04/01/17 9:15 AM Phone: [phone] Fax: [phone]  Alvera Singh 04/01/2017, 9:13 AM  [facility] [address], [place], [place], [address] Phone: [phone]   Fax:  [phone]  Name: Lee Smith MRN: [id number] Date of Birth: 02-01-1946 PHYSICAL THERAPY DISCHARGE SUMMARY  Visits from Start of Care: 4  Current functional level related to goals / functional outcomes: See above.    Remaining deficits: See above. Unable to full assess patient due to not returning to therapy after last visit.    Education / Equipment: HEP Plan: Patient agrees to discharge.   Patient goals were not met. Patient is being discharged due to not returning since the last visit. Thank you for the referral. Earlie Counts, PT 08/11/17 3:15 PM    ?????

## 2017-04-04 ENCOUNTER — Encounter: Payer: Medicare Other | Admitting: Physical Therapy

## 2017-04-07 ENCOUNTER — Encounter: Payer: Self-pay | Admitting: Family Medicine

## 2017-04-29 ENCOUNTER — Ambulatory Visit (INDEPENDENT_AMBULATORY_CARE_PROVIDER_SITE_OTHER): Payer: Medicare Other | Admitting: Family Medicine

## 2017-04-29 ENCOUNTER — Encounter: Payer: Self-pay | Admitting: Family Medicine

## 2017-04-29 VITALS — BP 138/78 | HR 75 | Temp 98.1°F | Ht 72.0 in | Wt 229.0 lb

## 2017-04-29 DIAGNOSIS — R42 Dizziness and giddiness: Secondary | ICD-10-CM | POA: Diagnosis not present

## 2017-04-29 DIAGNOSIS — I10 Essential (primary) hypertension: Secondary | ICD-10-CM

## 2017-04-29 DIAGNOSIS — Z23 Encounter for immunization: Secondary | ICD-10-CM

## 2017-04-29 DIAGNOSIS — D1801 Hemangioma of skin and subcutaneous tissue: Secondary | ICD-10-CM | POA: Diagnosis not present

## 2017-04-29 DIAGNOSIS — L729 Follicular cyst of the skin and subcutaneous tissue, unspecified: Secondary | ICD-10-CM

## 2017-04-29 DIAGNOSIS — S91209A Unspecified open wound of unspecified toe(s) with damage to nail, initial encounter: Secondary | ICD-10-CM | POA: Diagnosis not present

## 2017-04-29 NOTE — Progress Notes (Signed)
Subjective:     Patient ID: Lee Smith, male   DOB: June 14, 1946, 71 y.o.   MRN: [id number]  HPI Patient seen requesting removal of left great toenail and also have several skin lesions checked. He has cystic type lesion left upper lid and also slightly raised lesion right shoulder and middle of back.  Patient is in the middle of kitchen remodel and dropped countertop on his left big toe several weeks ago. He had some bruising. Toenails lifted up and is hanging on at the base and patient is requesting removal. He is afraid to try to pull this off himself. Still attached to skin at base.   Patient has hypertension treated with amlodipine and losartan. He's had several episodes recently where he squatted down and standing up feels lightheaded. Generally thinks he is staying well-hydrated. No vertigo. No syncope. No chest pains.  Past Medical History:  Diagnosis Date  . Allergic rhinitis   . Arthritis    bilateral knees; has planned partial knee replacement next month  . Genital warts   . History of kidney stones 11/2013   lithotripsy for right ureteral stone  . Hx of colonic polyps    2008  . Hyperlipidemia   . Hypertension   . Right nephrolithiasis    Past Surgical History:  Procedure Laterality Date  . CHOLECYSTECTOMY  2002  . CYSTOSCOPY W/ URETERAL STENT PLACEMENT Right 02/12/2014   Procedure: CYSTOSCOPY WITH RETROGRADE PYELOGRAM/URETERAL STENT PLACEMENT;  Surgeon: Festus Aloe, MD;  Location: [facility];  Service: Urology;  Laterality: Right;  . CYSTOSCOPY WITH RETROGRADE PYELOGRAM, URETEROSCOPY AND STENT PLACEMENT Right 02/12/2014   Procedure: RIGHT URETEROSCOPY ;  Surgeon: Festus Aloe, MD;  Location: [facility];  Service: Urology;  Laterality: Right;  . CYSTOSCOPY WITH URETEROSCOPY AND STENT PLACEMENT Right 03/05/2014   Procedure: CYSTOSCOPY WITH URETEROSCOPY AND STENT EXCHANGE, STONE EXTRACTION;  Surgeon: Festus Aloe, MD;  Location:  [facility];  Service: Urology;  Laterality: Right;  . CYSTOSCOPY WITH URETEROSCOPY AND STENT PLACEMENT Right 03/26/2014   Procedure: CYSTO WITH RIGHT URETEROSCOPY/STENT PLACEMENT;  Surgeon: Festus Aloe, MD;  Location: [facility];  Service: Urology;  Laterality: Right;  . EXTRACORPOREAL SHOCK WAVE LITHOTRIPSY Right 12-17-2013  . HOLMIUM LASER APPLICATION Right 4/43/1540   Procedure: HOLMIUM LASER APPLICATION;  Surgeon: Festus Aloe, MD;  Location[facility];  Service: Urology;  Laterality: Right;  . INGUINAL HERNIA REPAIR Bilateral right 2003/   left 2004  . STONE EXTRACTION WITH BASKET Right 03/26/2014   Procedure: STONE EXTRACTION WITH BASKET;  Surgeon: Festus Aloe, MD;  Location: [facility];  Service: Urology;  Laterality: Right;  . 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 UMBILICAL HERNIA REPAIR  2000    reports that he quit smoking about 46 years ago. His smoking use included Cigarettes. He has a 6.00 pack-year smoking history. He has quit using smokeless tobacco. His smokeless tobacco use included Chew. He reports that he drinks about 4.2 oz of alcohol per week . He reports that he does not use drugs. family history includes Arthritis in his unknown relative; Cancer in his father and sister; Hyperlipidemia in his unknown relative; Hypertension in his unknown relative. No Known Allergies   Review of Systems  Constitutional: Negative for chills and fever.  Respiratory: Negative for cough and shortness of breath.   Cardiovascular: Negative for chest pain.  Endocrine: Negative for polydipsia and polyuria.  Neurological: Positive for dizziness and light-headedness. Negative for  syncope.       Objective:   Physical Exam  Constitutional: He appears well-developed and well-nourished.  Cardiovascular: Normal rate and regular rhythm.   Pulmonary/Chest: Effort normal and breath sounds normal. No respiratory distress.  He has no wheezes. He has no rales.  Musculoskeletal: He exhibits no edema.  Skin:  Patient has very small approximately 1 mm well demarcated subcutaneous cystic type lesion left upper eyelid  He has slightly raised angiomatous lesions right shoulder and mid thoracic back. No atypical features  Left great toenail is loosened except at base where attached to skin.         Assessment:     #1 left toenail - mostly avulsed nail. Still attached at base #2 lightheadedness with standing. Standing blood pressure today 115/68 #3 multiple skin lesions which are benign including benign cystic type lesion left upper eyelid and angiomas of the back and right shoulder     Plan:     -Reassurance regarding skin lesions  -Stay well-hydrated  -Monitor home blood pressures and if consistent systolic readings less than 110 hold amlodipine  -Change positions slowly  -We discussed risk and benefits of excision of left great toenail. Patient consented. Digital block with 1% plain Xylocaine and patient tolerated well. Using hemostats were able to remove nail without difficulty. No bleeding  -Keep clean with soap and water and follow-up for any signs of secondary infection  -Flu vaccine given   [name] MD [facility]

## 2017-04-29 NOTE — Patient Instructions (Signed)
Monitor blood pressure. If consistent BP < 110/70, may consider hold the Amlodipine to see if lightheadedness stops.   Goal BP < 130/80

## 2017-05-01 DIAGNOSIS — L729 Follicular cyst of the skin and subcutaneous tissue, unspecified: Secondary | ICD-10-CM | POA: Insufficient documentation

## 2017-06-20 ENCOUNTER — Other Ambulatory Visit: Payer: Self-pay | Admitting: Family Medicine

## 2017-07-04 ENCOUNTER — Other Ambulatory Visit: Payer: Self-pay | Admitting: Family Medicine

## 2017-07-04 DIAGNOSIS — H35373 Puckering of macula, bilateral: Secondary | ICD-10-CM | POA: Diagnosis not present

## 2017-07-04 DIAGNOSIS — H35431 Paving stone degeneration of retina, right eye: Secondary | ICD-10-CM | POA: Diagnosis not present

## 2017-07-04 DIAGNOSIS — H43813 Vitreous degeneration, bilateral: Secondary | ICD-10-CM | POA: Diagnosis not present

## 2017-11-03 DIAGNOSIS — Z471 Aftercare following joint replacement surgery: Secondary | ICD-10-CM | POA: Diagnosis not present

## 2017-11-03 DIAGNOSIS — M25461 Effusion, right knee: Secondary | ICD-10-CM | POA: Diagnosis not present

## 2017-11-03 DIAGNOSIS — Z96651 Presence of right artificial knee joint: Secondary | ICD-10-CM | POA: Diagnosis not present

## 2017-11-08 NOTE — Progress Notes (Addendum)
Subjective:   Lee Smith is a 72 y.o. male who presents for Medicare Annual/Subsequent preventive examination.  Reports health as good Just got married x 3 years ago   Diet Diet is good  Breakfast in general oatmeal; atkins; goes out 1 or 2 times a week Lunch; varies; may have lunch 1 or 1;30  Cooks mostly and goes out 2 times a week  [place] to [place]  Beach a couple of times; 5 wheel and likes camping    BMI 32   Exercise Stays active; rides bikes in [place] a lot Mowing yard and workshop downstairs and up and down stairs May join the gym again    Health Maintenance Due  Topic Date Due  . TETANUS/TDAP  07/19/2017   ETOH drinks one per day  Tobacco; quit 71; 6 pack years AAA  Discussed         Objective:    Vitals: There were no vitals taken for this visit.  There is no height or weight on file to calculate BMI.  Advanced Directives 03/14/2017 10/28/2016 07/13/2016 03/19/2015 03/17/2015 03/12/2015 03/06/2015  Does Patient Have a Medical Advance Directive? Yes Yes Yes No No No No  Type of Advance Directive Living will [place];Living will [place];Living will - - - -  Does patient want to make changes to medical advance directive? - No - Patient declined - - - - -  Copy of [place] in Chart? - No - copy requested - - - - -  Would patient like information on creating a medical advance directive? - No - Patient declined - - - - No - patient declined information    Tobacco Social History   Tobacco Use  Smoking Status Former Smoker  . Packs/day: 1.00  . Years: 6.00  . Pack years: 6.00  . Types: Cigarettes  . Last attempt to quit: 10/12/1970  . Years since quitting: 71.1  Smokeless Tobacco Former Systems developer  . Types: Chew     Counseling given: Not Answered   Clinical Intake:     Past Medical History:  Diagnosis Date  . Allergic rhinitis   . Arthritis    bilateral knees; has planned partial  knee replacement next month  . Genital warts   . History of kidney stones 11/2013   lithotripsy for right ureteral stone  . Hx of colonic polyps    2008  . Hyperlipidemia   . Hypertension   . Right nephrolithiasis    Past Surgical History:  Procedure Laterality Date  . CHOLECYSTECTOMY  2002  . CYSTOSCOPY W/ URETERAL STENT PLACEMENT Right 02/12/2014   Procedure: CYSTOSCOPY WITH RETROGRADE PYELOGRAM/URETERAL STENT PLACEMENT;  Surgeon: Festus Aloe, MD;  Location: [facility];  Service: Urology;  Laterality: Right;  . CYSTOSCOPY WITH RETROGRADE PYELOGRAM, URETEROSCOPY AND STENT PLACEMENT Right 02/12/2014   Procedure: RIGHT URETEROSCOPY ;  Surgeon: Festus Aloe, MD;  Location: [facility];  Service: Urology;  Laterality: Right;  . CYSTOSCOPY WITH URETEROSCOPY AND STENT PLACEMENT Right 03/05/2014   Procedure: CYSTOSCOPY WITH URETEROSCOPY AND STENT EXCHANGE, STONE EXTRACTION;  Surgeon: Festus Aloe, MD;  Location: [facility];  Service: Urology;  Laterality: Right;  . CYSTOSCOPY WITH URETEROSCOPY AND STENT PLACEMENT Right 03/26/2014   Procedure: CYSTO WITH RIGHT URETEROSCOPY/STENT PLACEMENT;  Surgeon: Festus Aloe, MD;  Location: [facility];  Service: Urology;  Laterality: Right;  . EXTRACORPOREAL SHOCK WAVE LITHOTRIPSY Right 12-17-2013  . HOLMIUM LASER  APPLICATION Right 9/37/9024   Procedure: HOLMIUM LASER APPLICATION;  Surgeon: Festus Aloe, MD;  Location[facility];  Service: Urology;  Laterality: Right;  . INGUINAL HERNIA REPAIR Bilateral right 2003/   left 2004  . STONE EXTRACTION WITH BASKET Right 03/26/2014   Procedure: STONE EXTRACTION WITH BASKET;  Surgeon: Festus Aloe, MD;  Location[facility];  Service: Urology;  Laterality: Right;  . 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 UMBILICAL HERNIA REPAIR  2000   Family History  Problem Relation Age of Onset  . Cancer Father         prostate  . Cancer Sister        breast  . Arthritis Unknown        family hx  . Hyperlipidemia Unknown        family hx  . Hypertension Unknown        family hx   Social History   Socioeconomic History  . Marital status: Married    Spouse name: Not on file  . Number of children: Not on file  . Years of education: Not on file  . Highest education level: Not on file  Occupational History  . Not on file  Social Needs  . Financial resource strain: Not on file  . Food insecurity:    Worry: Not on file    Inability: Not on file  . Transportation needs:    Medical: Not on file    Non-medical: Not on file  Tobacco Use  . Smoking status: Former Smoker    Packs/day: 1.00    Years: 6.00    Pack years: 6.00    Types: Cigarettes    Last attempt to quit: 10/12/1970    Years since quitting: 47.1  . Smokeless tobacco: Former Systems developer    Types: Chew  Substance and Sexual Activity  . Alcohol use: Yes    Alcohol/week: 4.2 oz    Types: 7 Glasses of wine per week    Comment: never drinks more than one per day  . Drug use: No  . Sexual activity: Not on file  Lifestyle  . Physical activity:    Days per week: Not on file    Minutes per session: Not on file  . Stress: Not on file  Relationships  . Social connections:    Talks on phone: Not on file    Gets together: Not on file    Attends religious service: Not on file    Active member of club or organization: Not on file    Attends meetings of clubs or organizations: Not on file    Relationship status: Not on file  Other Topics Concern  . Not on file  Social History Narrative  . Not on file    Outpatient Encounter Medications as of 11/09/2017  Medication Sig  . acetaminophen (TYLENOL) 500 MG tablet Take 500 mg by mouth every 6 (six) hours as needed.  Marland Kitchen albuterol (PROVENTIL HFA;VENTOLIN HFA) 108 (90 Base) MCG/ACT inhaler Inhale 2 puffs into the lungs every 6 (six) hours as needed for wheezing or shortness of breath.  Marland Kitchen  amLODipine (NORVASC) 5 MG tablet TAKE 1 TABLET BY MOUTH ONCE DAILY  . aspirin EC 81 MG tablet Take 81 mg by mouth daily.  Marland Kitchen atorvastatin (LIPITOR) 20 MG tablet TAKE ONE TABLET BY MOUTH ONCE DAILY  . colchicine 0.6 MG tablet Take 1 tablet (0.6 mg total) by mouth 2 (two) times daily. (Patient not taking: Reported on 04/29/2017)  .  losartan (COZAAR) 100 MG tablet Take 1 tablet (100 mg total) by mouth daily.  Marland Kitchen losartan (COZAAR) 100 MG tablet TAKE 1 TABLET BY MOUTH ONCE DAILY  . Naproxen Sodium (ALEVE) 220 MG CAPS Take by mouth.  . polyvinyl alcohol (LIQUIFILM TEARS) 1.4 % ophthalmic solution Place 1 drop into both eyes as needed for dry eyes.  . tadalafil (CIALIS) 20 MG tablet Take 1 tablet (20 mg total) by mouth daily as needed for erectile dysfunction.  . tamsulosin (FLOMAX) 0.4 MG CAPS capsule Take 0.4 mg by mouth.   No facility-administered encounter medications on file as of 11/09/2017.     Activities of Daily Living No flowsheet data found.  Patient Care Team: [name], MD as PCP - General    UR for urinary issues- Dr. Audree Bane   Assessment:   This is a routine wellness examination for [place].  Exercise Activities and Dietary recommendations    Goals    None      Fall Risk Fall Risk  07/13/2016 07/02/2015 10/25/2014 10/08/2013  Falls in the past year? No No No No     Depression Screen PHQ 2/9 Scores 07/13/2016 07/02/2015 10/25/2014 10/08/2013  PHQ - 2 Score 0 0 0 0    Cognitive Function MMSE - Mini Mental State Exam 07/13/2016  Not completed: (No Data)     Ad8 score reviewed for issues:  Issues making decisions:  Less interest in hobbies / activities:  Repeats questions, stories (family complaining):  Trouble using ordinary gadgets (microwave, computer, phone):  Forgets the month or year:   Mismanaging finances:   Remembering appts:  Daily problems with thinking and/or memory: Ad8 score is=0       Immunization History  Administered  Date(s) Administered  . Influenza Split 06/08/2011, 04/19/2012  . Influenza Whole 06/04/2009, 04/16/2010  . Influenza, High Dose Seasonal PF 05/07/2016, 04/29/2017  . Influenza,inj,Quad PF,6+ Mos 04/23/2013, 05/28/2014  . Influenza-Unspecified 06/16/2015  . Pneumococcal Conjugate-13 05/28/2014  . Pneumococcal Polysaccharide-23 04/16/2010  . Td 07/20/2007  . Zoster 06/16/2015     Screening Tests Health Maintenance  Topic Date Due  . TETANUS/TDAP  07/19/2017  . Hepatitis C Screening  06/04/2026 (Originally 04-08-1946)  . INFLUENZA VACCINE  02/16/2018  . COLONOSCOPY  12/26/2021  . PNA vac Low Risk Adult  Completed         Plan:      PCP Notes   Health Maintenance Colonoscopy being planned with Dr. Paulita Fujita and in process of a referral; not sure of timeline for next colonoscopy but he is following up  Educated regarding the shingrix   Will fup with pharmacy for Tdap   Abnormal Screens  none  Referrals  none  Patient concerns; None; just got back from winter in [facility] Doing well, will start exercising at [facility] and continue biking one day a week   Nurse Concerns; As noted   Next PCP apt Seeing today      I have personally reviewed and noted the following in the patient's chart:   . Medical and social history . Use of alcohol, tobacco or illicit drugs  . Current medications and supplements . Functional ability and status . Nutritional status . Physical activity . Advanced directives . List of other physicians . Hospitalizations, surgeries, and ER visits in previous 12 months . Vitals . Screenings to include cognitive, depression, and falls . Referrals and appointments  In addition, I have reviewed and discussed with patient certain preventive protocols, quality metrics, and best practice recommendations.  A written personalized care plan for preventive services as well as general preventive health recommendations were provided to patient.     EQAST,MHDQQ,  RN  11/08/2017  Above assessment reviewed and agree.  [name] MD [facility]

## 2017-11-09 ENCOUNTER — Encounter: Payer: Self-pay | Admitting: Family Medicine

## 2017-11-09 ENCOUNTER — Ambulatory Visit (INDEPENDENT_AMBULATORY_CARE_PROVIDER_SITE_OTHER): Payer: Medicare Other

## 2017-11-09 ENCOUNTER — Ambulatory Visit (INDEPENDENT_AMBULATORY_CARE_PROVIDER_SITE_OTHER): Payer: Medicare Other | Admitting: Family Medicine

## 2017-11-09 ENCOUNTER — Other Ambulatory Visit: Payer: Self-pay | Admitting: Family Medicine

## 2017-11-09 VITALS — BP 120/80 | HR 75 | Ht 72.0 in | Wt 238.0 lb

## 2017-11-09 VITALS — BP 120/80 | HR 75 | Temp 97.9°F | Ht 72.0 in | Wt 238.9 lb

## 2017-11-09 DIAGNOSIS — N401 Enlarged prostate with lower urinary tract symptoms: Secondary | ICD-10-CM

## 2017-11-09 DIAGNOSIS — N138 Other obstructive and reflux uropathy: Secondary | ICD-10-CM | POA: Diagnosis not present

## 2017-11-09 DIAGNOSIS — M109 Gout, unspecified: Secondary | ICD-10-CM | POA: Insufficient documentation

## 2017-11-09 DIAGNOSIS — D126 Benign neoplasm of colon, unspecified: Secondary | ICD-10-CM | POA: Insufficient documentation

## 2017-11-09 DIAGNOSIS — L729 Follicular cyst of the skin and subcutaneous tissue, unspecified: Secondary | ICD-10-CM

## 2017-11-09 DIAGNOSIS — I10 Essential (primary) hypertension: Secondary | ICD-10-CM | POA: Diagnosis not present

## 2017-11-09 DIAGNOSIS — E785 Hyperlipidemia, unspecified: Secondary | ICD-10-CM | POA: Diagnosis not present

## 2017-11-09 DIAGNOSIS — Z0000 Encounter for general adult medical examination without abnormal findings: Secondary | ICD-10-CM | POA: Diagnosis not present

## 2017-11-09 DIAGNOSIS — Z125 Encounter for screening for malignant neoplasm of prostate: Secondary | ICD-10-CM

## 2017-11-09 LAB — BASIC METABOLIC PANEL
BUN: 21 mg/dL (ref 6–23)
CO2: 28 meq/L (ref 19–32)
Calcium: 9.4 mg/dL (ref 8.4–10.5)
Chloride: 104 mEq/L (ref 96–112)
Creatinine, Ser: 0.93 mg/dL (ref 0.40–1.50)
GFR: 84.71 mL/min (ref >60.00)
GLUCOSE: 89 mg/dL (ref 70–99)
POTASSIUM: 4.4 meq/L (ref 3.5–5.1)
SODIUM: 138 meq/L (ref 135–145)

## 2017-11-09 LAB — HEPATIC FUNCTION PANEL
ALBUMIN: 4.3 g/dL (ref 3.5–5.2)
ALT: 19 U/L (ref 0–53)
AST: 22 U/L (ref 0–37)
Alkaline Phosphatase: 69 U/L (ref 39–117)
Bilirubin, Direct: 0.2 mg/dL (ref 0.0–0.3)
Total Bilirubin: 0.7 mg/dL (ref 0.2–1.2)
Total Protein: 6.7 g/dL (ref 6.0–8.3)

## 2017-11-09 LAB — LIPID PANEL
CHOL/HDL RATIO: 3
Cholesterol: 152 mg/dL (ref 0–200)
HDL: 49.7 mg/dL (ref >39.00)
LDL Cholesterol: 84 mg/dL (ref 0–99)
NONHDL: 102.63
Triglycerides: 92 mg/dL (ref 0.0–149.0)
VLDL: 18.4 mg/dL (ref 0.0–40.0)

## 2017-11-09 LAB — PSA: PSA: 2.73 ng/mL (ref 0.10–4.00)

## 2017-11-09 MED ORDER — LOSARTAN POTASSIUM 100 MG PO TABS: 100.0000 mg | ORAL_TABLET | Freq: Every day | ORAL | 3 refills | Status: DC

## 2017-11-09 NOTE — Patient Instructions (Addendum)
Mr. Lee Smith , Thank you for taking time to come for your Medicare Wellness Visit. I appreciate your ongoing commitment to your health goals. Please review the following plan we discussed and let me know if I can assist you in the future.   Will fup with Dr. Paulita Fujita for colonoscopy   A Tetanus is recommended every 10 years. Medicare covers a tetanus if you have a cut or wound; otherwise, there may be a charge. If you had not had a tetanus with pertusses, known as the Tdap, you can take this anytime.   Shingrix is a vaccine for the prevention of Shingles in Adults 50 and older.  If you are on Medicare, the shingrix is covered under your Part D plan, so you will take both of the vaccines in the series at your pharmacy. Please check with your benefits regarding applicable copays or out of pocket expenses.  The Shingrix is given in 2 vaccines approx 8 weeks apart. You must receive the 2nd dose prior to 6 months from receipt of the first. Please have the pharmacist print out you Immunization  dates for our office records      These are the goals we discussed: Goals    . Exercise 150 min/wk Moderate Activity     Go back to the gym     . Weight (lb) < 200 lb (90.7 kg)     Eats less portions, eat better  Check out  online nutrition programs as GumSearch.nl and http://vang.com/; fit41me; Look for foods with "whole" wheat; bran; oatmeal etc Shot at the farmer's markets in season for fresher choices  Watch for "hydrogenated" on the label of oils which are trans-fats.  Watch for "high fructose corn syrup" in snacks, yogurt or ketchup  Meats have less marbling; bright colored fruits and vegetables;  Canned; dump out liquid and wash vegetables. Be mindful of what we are eating  Portion control is essential to a health weight! Sit down; take a break and enjoy your meal; take smaller bites; put the fork down between bites;  It takes 20 minutes to get full; so check in with your fullness cues and  stop eating when you start to fill full              This is a list of the screening recommended for you and due dates:  Health Maintenance  Topic Date Due  . Tetanus Vaccine  07/19/2017  .  Hepatitis C: One time screening is recommended by Center for Disease Control  (CDC) for  adults born from [birth year] through 1966.   06/04/2026*  . Flu Shot  02/16/2018  . Colon Cancer Screening  12/26/2021  . Pneumonia vaccines  Completed  *Topic was postponed. The date shown is not the original due date.   Personal safety issues reviewed:  1. Consider starting a community watch program per [facility] 2.  Changes batteries is smoke detector and/or carbon monoxide detector  3.  If you have firearms; keep them in a safe place 4.  Wear protection when in the sun; Always wear sunscreen or a hat; It is good to have your doctor check your skin annually or review any new areas of concern 5. Driving safety; Keep in the right lane; stay 3 car lengths behind the car in front of you on the highway; look 3 times prior to pulling out; carry your cell phone everywhere you go!     Fall Prevention in the Home Falls can cause injuries.  They can happen to people of all ages. There are many things you can do to make your home safe and to help prevent falls. What can I do on the outside of my home?  Regularly fix the edges of walkways and driveways and fix any cracks.  Remove anything that might make you trip as you walk through a door, such as a raised step or threshold.  Trim any bushes or trees on the path to your home.  Use bright outdoor lighting.  Clear any walking paths of anything that might make someone trip, such as rocks or tools.  Regularly check to see if handrails are loose or broken. Make sure that both sides of any steps have handrails.  Any raised decks and porches should have guardrails on the edges.  Have any leaves, snow, or ice cleared regularly.  Use sand or salt on  walking paths during winter.  Clean up any spills in your garage right away. This includes oil or grease spills. What can I do in the bathroom?  Use night lights.  Install grab bars by the toilet and in the tub and shower. Do not use towel bars as grab bars.  Use non-skid mats or decals in the tub or shower.  If you need to sit down in the shower, use a plastic, non-slip stool.  Keep the floor dry. Clean up any water that spills on the floor as soon as it happens.  Remove soap buildup in the tub or shower regularly.  Attach bath mats securely with double-sided non-slip rug tape.  Do not have throw rugs and other things on the floor that can make you trip. What can I do in the bedroom?  Use night lights.  Make sure that you have a light by your bed that is easy to reach.  Do not use any sheets or blankets that are too big for your bed. They should not hang down onto the floor.  Have a firm chair that has side arms. You can use this for support while you get dressed.  Do not have throw rugs and other things on the floor that can make you trip. What can I do in the kitchen?  Clean up any spills right away.  Avoid walking on wet floors.  Keep items that you use a lot in easy-to-reach places.  If you need to reach something above you, use a strong step stool that has a grab bar.  Keep electrical cords out of the way.  Do not use floor polish or wax that makes floors slippery. If you must use wax, use non-skid floor wax.  Do not have throw rugs and other things on the floor that can make you trip. What can I do with my stairs?  Do not leave any items on the stairs.  Make sure that there are handrails on both sides of the stairs and use them. Fix handrails that are broken or loose. Make sure that handrails are as long as the stairways.  Check any carpeting to make sure that it is firmly attached to the stairs. Fix any carpet that is loose or worn.  Avoid having throw  rugs at the top or bottom of the stairs. If you do have throw rugs, attach them to the floor with carpet tape.  Make sure that you have a light switch at the top of the stairs and the bottom of the stairs. If you do not have them, ask someone to add them  for you. What else can I do to help prevent falls?  Wear shoes that: ? Do not have high heels. ? Have rubber bottoms. ? Are comfortable and fit you well. ? Are closed at the toe. Do not wear sandals.  If you use a stepladder: ? Make sure that it is fully opened. Do not climb a closed stepladder. ? Make sure that both sides of the stepladder are locked into place. ? Ask someone to hold it for you, if possible.  Clearly mark and make sure that you can see: ? Any grab bars or handrails. ? First and last steps. ? Where the edge of each step is.  Use tools that help you move around (mobility aids) if they are needed. These include: ? Canes. ? Walkers. ? Scooters. ? Crutches.  Turn on the lights when you go into a dark area. Replace any light bulbs as soon as they burn out.  Set up your furniture so you have a clear path. Avoid moving your furniture around.  If any of your floors are uneven, fix them.  If there are any pets around you, be aware of where they are.  Review your medicines with your doctor. Some medicines can make you feel dizzy. This can increase your chance of falling. Ask your doctor what other things that you can do to help prevent falls. This information is not intended to replace advice given to you by your health care provider. Make sure you discuss any questions you have with your health care provider. Document Released: 05/01/2009 Document Revised: 12/11/2015 Document Reviewed: 08/09/2014 Elsevier Interactive Patient Education  2018 [place] Maintenance, Male A healthy lifestyle and preventive care is important for your health and wellness. Ask your health care provider about what schedule of  regular examinations is right for you. What should I know about weight and diet? Eat a Healthy Diet  Eat plenty of vegetables, fruits, whole grains, low-fat dairy products, and lean protein.  Do not eat a lot of foods high in solid fats, added sugars, or salt.  Maintain a Healthy Weight Regular exercise can help you achieve or maintain a healthy weight. You should:  Do at least 150 minutes of exercise each week. The exercise should increase your heart rate and make you sweat (moderate-intensity exercise).  Do strength-training exercises at least twice a week.  Watch Your Levels of Cholesterol and Blood Lipids  Have your blood tested for lipids and cholesterol every 5 years starting at 73 years of age. If you are at high risk for heart disease, you should start having your blood tested when you are 73 years old. You may need to have your cholesterol levels checked more often if: ? Your lipid or cholesterol levels are high. ? You are older than 73 years of age. ? You are at high risk for heart disease.  What should I know about cancer screening? Many types of cancers can be detected early and may often be prevented. Lung Cancer  You should be screened every year for lung cancer if: ? You are a current smoker who has smoked for at least 30 years. ? You are a former smoker who has quit within the past 15 years.  Talk to your health care provider about your screening options, when you should start screening, and how often you should be screened.  Colorectal Cancer  Routine colorectal cancer screening usually begins at 73 years of age and should be repeated every 5-10  years until you are 73 years old. You may need to be screened more often if early forms of precancerous polyps or small growths are found. Your health care provider may recommend screening at an earlier age if you have risk factors for colon cancer.  Your health care provider may recommend using home test kits to check for  hidden blood in the stool.  A small camera at the end of a tube can be used to examine your colon (sigmoidoscopy or colonoscopy). This checks for the earliest forms of colorectal cancer.  Prostate and Testicular Cancer  Depending on your age and overall health, your health care provider may do certain tests to screen for prostate and testicular cancer.  Talk to your health care provider about any symptoms or concerns you have about testicular or prostate cancer.  Skin Cancer  Check your skin from head to toe regularly.  Tell your health care provider about any new moles or changes in moles, especially if: ? There is a change in a mole's size, shape, or color. ? You have a mole that is larger than a pencil eraser.  Always use sunscreen. Apply sunscreen liberally and repeat throughout the day.  Protect yourself by wearing long sleeves, pants, a wide-brimmed hat, and sunglasses when outside.  What should I know about heart disease, diabetes, and high blood pressure?  If you are 56-47 years of age, have your blood pressure checked every 3-5 years. If you are 68 years of age or older, have your blood pressure checked every year. You should have your blood pressure measured twice-once when you are at a hospital or clinic, and once when you are not at a hospital or clinic. Record the average of the two measurements. To check your blood pressure when you are not at a hospital or clinic, you can use: ? An automated blood pressure machine at a pharmacy. ? A home blood pressure monitor.  Talk to your health care provider about your target blood pressure.  If you are between 45-54 years old, ask your health care provider if you should take aspirin to prevent heart disease.  Have regular diabetes screenings by checking your fasting blood sugar level. ? If you are at a normal weight and have a low risk for diabetes, have this test once every three years after the age of 25. ? If you are  overweight and have a high risk for diabetes, consider being tested at a younger age or more often.  A one-time screening for abdominal aortic aneurysm (AAA) by ultrasound is recommended for men aged 50-75 years who are current or former smokers. What should I know about preventing infection? Hepatitis B If you have a higher risk for hepatitis B, you should be screened for this virus. Talk with your health care provider to find out if you are at risk for hepatitis B infection. Hepatitis C Blood testing is recommended for:  Everyone born from [birth year] through 1965.  Anyone with known risk factors for hepatitis C.  Sexually Transmitted Diseases (STDs)  You should be screened each year for STDs including gonorrhea and chlamydia if: ? You are sexually active and are younger than 73 years of age. ? You are older than 73 years of age and your health care provider tells you that you are at risk for this type of infection. ? Your sexual activity has changed since you were last screened and you are at an increased risk for chlamydia or gonorrhea. Ask your health  care provider if you are at risk.  Talk with your health care provider about whether you are at high risk of being infected with HIV. Your health care provider may recommend a prescription medicine to help prevent HIV infection.  What else can I do?  Schedule regular health, dental, and eye exams.  Stay current with your vaccines (immunizations).  Do not use any tobacco products, such as cigarettes, chewing tobacco, and e-cigarettes. If you need help quitting, ask your health care provider.  Limit alcohol intake to no more than 2 drinks per day. One drink equals 12 ounces of beer, 5 ounces of wine, or 1 ounces of hard liquor.  Do not use street drugs.  Do not share needles.  Ask your health care provider for help if you need support or information about quitting drugs.  Tell your health care provider if you often feel  depressed.  Tell your health care provider if you have ever been abused or do not feel safe at home. This information is not intended to replace advice given to you by your health care provider. Make sure you discuss any questions you have with your health care provider. Document Released: 01/01/2008 Document Revised: 03/03/2016 Document Reviewed: 04/08/2015 Elsevier Interactive Patient Education  2018 [employer].   Hearing Loss Hearing loss is a partial or total loss of the ability to hear. This can be temporary or permanent, and it can happen in one or both ears. Hearing loss may be referred to as deafness. Medical care is necessary to treat hearing loss properly and to prevent the condition from getting worse. Your hearing may partially or completely come back, depending on what caused your hearing loss and how severe it is. In some cases, hearing loss is permanent. What are the causes? Common causes of hearing loss include:  Too much wax in the ear canal.  Infection of the ear canal or middle ear.  Fluid in the middle ear.  Injury to the ear or surrounding area.  An object stuck in the ear.  Prolonged exposure to loud sounds, such as music.  Less common causes of hearing loss include:  Tumors in the ear.  Viral or bacterial infections, such as meningitis.  A hole in the eardrum (perforated eardrum).  Problems with the hearing nerve that sends signals between the brain and the ear.  Certain medicines.  What are the signs or symptoms? Symptoms of this condition may include:  Difficulty telling the difference between sounds.  Difficulty following a conversation when there is background noise.  Lack of response to sounds in your environment. This may be most noticeable when you do not respond to startling sounds.  Needing to turn up the volume on the television, radio, etc.  Ringing in the ears.  Dizziness.  Pain in the ears.  How is this diagnosed? This  condition is diagnosed based on a physical exam and a hearing test (audiometry). The audiometry test will be performed by a hearing specialist (audiologist). You may also be referred to an ear, nose, and throat (ENT) specialist (otolaryngologist). How is this treated? Treatment for recent onset of hearing loss may include:  Ear wax removal.  Being prescribed medicines to prevent infection (antibiotics).  Being prescribed medicines to reduce inflammation (corticosteroids).  Follow these instructions at home:  If you were prescribed an antibiotic medicine, take it as told by your health care provider. Do not stop taking the antibiotic even if you start to feel better.  Take over-the-counter  and prescription medicines only as told by your health care provider.  Avoid loud noises.  Return to your normal activities as told by your health care provider. Ask your health care provider what activities are safe for you.  Keep all follow-up visits as told by your health care provider. This is important. Contact a health care provider if:  You feel dizzy.  You develop new symptoms.  You vomit or feel nauseous.  You have a fever. Get help right away if:  You develop sudden changes in your vision.  You have severe ear pain.  You have new or increased weakness.  You have a severe headache. This information is not intended to replace advice given to you by your health care provider. Make sure you discuss any questions you have with your health care provider. Document Released: 07/05/2005 Document Revised: 12/11/2015 Document Reviewed: 11/20/2014 Elsevier Interactive Patient Education  2018 [employer].

## 2017-11-09 NOTE — Progress Notes (Signed)
Subjective:     Patient ID: Lee Smith, male   DOB: 1946-04-17, 72 y.o.   MRN: [id number]  HPI Patient here for Medicare wellness visit today and medical follow-up. History of hypertension, history of colon adenomas, hyperlipidemia, obesity. He is due for repeat colonoscopy and is in process now of setting up with GI.  History of gout with infrequent occurrence. He has used colchicine in the past as needed. Blood pressures been well controlled. Requesting refills of losartan. He takes Lipitor for hyperlipidemia. No recent myalgias. He is requesting follow-up PSA along with other labs. We discussed pros and cons of PSA testing.  He has small cystic lesion right face underneath the lower lid. He states this was excised by someone years ago but has recurred.  He has BPH which is currently stable on Flomax. Rare nocturia  Past Medical History:  Diagnosis Date  . Allergic rhinitis   . Arthritis    bilateral knees; has planned partial knee replacement next month  . Genital warts   . History of kidney stones 11/2013   lithotripsy for right ureteral stone  . Hx of colonic polyps    2008  . Hyperlipidemia   . Hypertension   . Right nephrolithiasis    Past Surgical History:  Procedure Laterality Date  . CHOLECYSTECTOMY  2002  . CYSTOSCOPY W/ URETERAL STENT PLACEMENT Right 02/12/2014   Procedure: CYSTOSCOPY WITH RETROGRADE PYELOGRAM/URETERAL STENT PLACEMENT;  Surgeon: Festus Aloe, MD;  Location: [facility];  Service: Urology;  Laterality: Right;  . CYSTOSCOPY WITH RETROGRADE PYELOGRAM, URETEROSCOPY AND STENT PLACEMENT Right 02/12/2014   Procedure: RIGHT URETEROSCOPY ;  Surgeon: Festus Aloe, MD;  Location: [facility];  Service: Urology;  Laterality: Right;  . CYSTOSCOPY WITH URETEROSCOPY AND STENT PLACEMENT Right 03/05/2014   Procedure: CYSTOSCOPY WITH URETEROSCOPY AND STENT EXCHANGE, STONE EXTRACTION;  Surgeon: Festus Aloe, MD;  Location: [facility];  Service: Urology;  Laterality: Right;  . CYSTOSCOPY WITH URETEROSCOPY AND STENT PLACEMENT Right 03/26/2014   Procedure: CYSTO WITH RIGHT URETEROSCOPY/STENT PLACEMENT;  Surgeon: Festus Aloe, MD;  Location: [facility];  Service: Urology;  Laterality: Right;  . EXTRACORPOREAL SHOCK WAVE LITHOTRIPSY Right 12-17-2013  . HOLMIUM LASER APPLICATION Right 08/20/5425   Procedure: HOLMIUM LASER APPLICATION;  Surgeon: Festus Aloe, MD;  Location: [facility];  Service: Urology;  Laterality: Right;  . INGUINAL HERNIA REPAIR Bilateral right 2003/   left 2004  . STONE EXTRACTION WITH BASKET Right 03/26/2014   Procedure: STONE EXTRACTION WITH BASKET;  Surgeon: Festus Aloe, MD;  Location: [facility];  Service: Urology;  Laterality: Right;  . 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 UMBILICAL HERNIA REPAIR  2000    reports that he quit smoking about 47 years ago. His smoking use included cigarettes. He has a 6.00 pack-year smoking history. He has quit using smokeless tobacco. His smokeless tobacco use included chew. He reports that he drinks about 4.2 oz of alcohol per week. He reports that he does not use drugs. family history includes Arthritis in his unknown relative; Cancer in his father and sister; Hyperlipidemia in his unknown relative; Hypertension in his mother and unknown relative. No Known Allergies   Review of Systems  Constitutional: Negative for fatigue.  Eyes: Negative for visual disturbance.  Respiratory: Negative for cough, chest tightness and shortness of breath.   Cardiovascular: Negative for chest pain, palpitations and leg swelling.  Gastrointestinal: Negative for abdominal pain.  Genitourinary: Negative for  difficulty urinating and dysuria.  Neurological: Negative for dizziness, syncope, weakness, light-headedness and headaches.       Objective:   Physical Exam  Constitutional: He is oriented to person,  place, and time. He appears well-developed and well-nourished.  Neck: Neck supple. No thyromegaly present.  Cardiovascular: Normal rate and regular rhythm.  Pulmonary/Chest: Effort normal and breath sounds normal. No respiratory distress. He has no wheezes. He has no rales.  Musculoskeletal: He exhibits no edema.  Neurological: He is alert and oriented to person, place, and time.  Skin:  Patient has small approximately 4-5 mm movable nontender cystic lesion  just beneath the epidermis right face below the lower lid region       Assessment:     #1 hypertension stable and at goal  #2 dyslipidemia  #3 history of BPH symptomatically stable on Flomax  #4 history of gout  #5 benign-appearing cystic lesion right face    Plan:     -Refill losartan for one year -Obtain labs including lipid panel, hepatic panel, basic metabolic panel -The natural history of prostate cancer and ongoing controversy regarding screening and potential treatment outcomes of prostate cancer has been discussed with the patient. The meaning of a false positive PSA and a false negative PSA has been discussed. He indicates understanding of the limitations of this screening test and wishes to proceed with screening PSA testing. -Set up dermatology referral regarding right face cystic lesion -Patient in process of setting up repeat colonoscopy which he is due for secondary to history of colon adenomas  [name] MD [facility]

## 2017-11-21 ENCOUNTER — Encounter: Payer: Self-pay | Admitting: Family Medicine

## 2017-11-23 ENCOUNTER — Other Ambulatory Visit: Payer: Self-pay | Admitting: Family Medicine

## 2017-12-02 DIAGNOSIS — Z86010 Personal history of colonic polyps: Secondary | ICD-10-CM | POA: Diagnosis not present

## 2017-12-02 DIAGNOSIS — R194 Change in bowel habit: Secondary | ICD-10-CM | POA: Diagnosis not present

## 2017-12-05 DIAGNOSIS — L570 Actinic keratosis: Secondary | ICD-10-CM | POA: Diagnosis not present

## 2017-12-05 DIAGNOSIS — L720 Epidermal cyst: Secondary | ICD-10-CM | POA: Diagnosis not present

## 2017-12-13 ENCOUNTER — Other Ambulatory Visit: Payer: Self-pay | Admitting: Family Medicine

## 2017-12-15 ENCOUNTER — Encounter: Payer: Self-pay | Admitting: Family Medicine

## 2017-12-22 ENCOUNTER — Encounter: Payer: Self-pay | Admitting: Family Medicine

## 2017-12-22 DIAGNOSIS — K5730 Diverticulosis of large intestine without perforation or abscess without bleeding: Secondary | ICD-10-CM | POA: Diagnosis not present

## 2017-12-22 DIAGNOSIS — Z86010 Personal history of colonic polyps: Secondary | ICD-10-CM | POA: Diagnosis not present

## 2017-12-22 DIAGNOSIS — D126 Benign neoplasm of colon, unspecified: Secondary | ICD-10-CM | POA: Diagnosis not present

## 2017-12-22 DIAGNOSIS — R194 Change in bowel habit: Secondary | ICD-10-CM | POA: Diagnosis not present

## 2017-12-22 LAB — HM COLONOSCOPY

## 2017-12-27 DIAGNOSIS — D126 Benign neoplasm of colon, unspecified: Secondary | ICD-10-CM | POA: Diagnosis not present

## 2018-01-02 DIAGNOSIS — H43813 Vitreous degeneration, bilateral: Secondary | ICD-10-CM | POA: Diagnosis not present

## 2018-01-02 DIAGNOSIS — H35373 Puckering of macula, bilateral: Secondary | ICD-10-CM | POA: Diagnosis not present

## 2018-01-02 DIAGNOSIS — D2310 Other benign neoplasm of skin of unspecified eyelid, including canthus: Secondary | ICD-10-CM | POA: Diagnosis not present

## 2018-01-02 DIAGNOSIS — H35431 Paving stone degeneration of retina, right eye: Secondary | ICD-10-CM | POA: Diagnosis not present

## 2018-01-06 ENCOUNTER — Other Ambulatory Visit: Payer: Self-pay | Admitting: Family Medicine

## 2018-01-06 ENCOUNTER — Encounter: Payer: Self-pay | Admitting: Family Medicine

## 2018-01-13 ENCOUNTER — Other Ambulatory Visit: Payer: Self-pay | Admitting: Family Medicine

## 2018-01-30 DIAGNOSIS — D485 Neoplasm of uncertain behavior of skin: Secondary | ICD-10-CM | POA: Diagnosis not present

## 2018-01-30 DIAGNOSIS — H11433 Conjunctival hyperemia, bilateral: Secondary | ICD-10-CM | POA: Diagnosis not present

## 2018-01-31 DIAGNOSIS — N401 Enlarged prostate with lower urinary tract symptoms: Secondary | ICD-10-CM | POA: Diagnosis not present

## 2018-01-31 DIAGNOSIS — N5201 Erectile dysfunction due to arterial insufficiency: Secondary | ICD-10-CM | POA: Diagnosis not present

## 2018-01-31 DIAGNOSIS — R351 Nocturia: Secondary | ICD-10-CM | POA: Diagnosis not present

## 2018-02-25 ENCOUNTER — Other Ambulatory Visit: Payer: Self-pay | Admitting: Family Medicine

## 2018-02-27 ENCOUNTER — Other Ambulatory Visit: Payer: Self-pay | Admitting: Family Medicine

## 2018-04-28 DIAGNOSIS — Z23 Encounter for immunization: Secondary | ICD-10-CM | POA: Diagnosis not present

## 2018-05-10 ENCOUNTER — Telehealth: Payer: Self-pay

## 2018-05-10 NOTE — Telephone Encounter (Signed)
Form placed on your desk

## 2018-05-10 NOTE — Telephone Encounter (Signed)
I have not seen form yet.

## 2018-05-10 NOTE — Telephone Encounter (Signed)
Have you seen this form?  Copied from [facility] [id number]. Topic: General - Other >> May 10, 2018  8:31 AM Janace Aris A wrote: Reason for CRM: Eliezer Lofts called in wanting to follow up on a medical clearance form that was faxed over to the office. Regarding the pt not taking the medication Asprin prior to the surgery on oct, 28th.    Please advise

## 2018-05-15 DIAGNOSIS — H02824 Cysts of left upper eyelid: Secondary | ICD-10-CM | POA: Diagnosis not present

## 2018-05-15 DIAGNOSIS — D485 Neoplasm of uncertain behavior of skin: Secondary | ICD-10-CM | POA: Diagnosis not present

## 2018-05-15 DIAGNOSIS — D22121 Melanocytic nevi of left upper eyelid, including canthus: Secondary | ICD-10-CM | POA: Diagnosis not present

## 2018-05-15 DIAGNOSIS — H11433 Conjunctival hyperemia, bilateral: Secondary | ICD-10-CM | POA: Diagnosis not present

## 2018-05-15 DIAGNOSIS — H04562 Stenosis of left lacrimal punctum: Secondary | ICD-10-CM | POA: Diagnosis not present

## 2018-05-17 NOTE — Telephone Encounter (Signed)
This form was faxed last week.

## 2018-05-25 DIAGNOSIS — Z09 Encounter for follow-up examination after completed treatment for conditions other than malignant neoplasm: Secondary | ICD-10-CM | POA: Diagnosis not present

## 2018-06-07 DIAGNOSIS — L819 Disorder of pigmentation, unspecified: Secondary | ICD-10-CM | POA: Diagnosis not present

## 2018-06-07 DIAGNOSIS — L570 Actinic keratosis: Secondary | ICD-10-CM | POA: Diagnosis not present

## 2018-06-21 ENCOUNTER — Encounter: Payer: Self-pay | Admitting: Family Medicine

## 2018-06-23 MED ORDER — AMLODIPINE BESYLATE 5 MG PO TABS: 5.0000 mg | ORAL_TABLET | Freq: Every day | ORAL | 1 refills | Status: DC

## 2018-09-07 ENCOUNTER — Encounter: Payer: Self-pay | Admitting: Family Medicine

## 2018-09-08 ENCOUNTER — Other Ambulatory Visit: Payer: Self-pay

## 2018-09-08 MED ORDER — ATORVASTATIN CALCIUM 20 MG PO TABS: 20.0000 mg | ORAL_TABLET | Freq: Every day | ORAL | 0 refills | Status: DC

## 2018-10-18 ENCOUNTER — Telehealth: Payer: Self-pay

## 2018-10-18 NOTE — Telephone Encounter (Signed)
Author sent mychart message to pt. to assess interest in rescheduling awv to virtual awv, requesting response.

## 2018-10-19 ENCOUNTER — Encounter: Payer: Self-pay | Admitting: Family Medicine

## 2018-10-19 ENCOUNTER — Telehealth: Payer: Self-pay

## 2018-10-19 NOTE — Telephone Encounter (Signed)
Pt. Needs virtual visit set-up for 4/29.

## 2018-10-20 ENCOUNTER — Encounter: Payer: Self-pay | Admitting: Family Medicine

## 2018-10-20 ENCOUNTER — Other Ambulatory Visit: Payer: Self-pay

## 2018-10-20 ENCOUNTER — Ambulatory Visit (INDEPENDENT_AMBULATORY_CARE_PROVIDER_SITE_OTHER): Payer: Medicare Other | Admitting: Family Medicine

## 2018-10-20 DIAGNOSIS — W57XXXA Bitten or stung by nonvenomous insect and other nonvenomous arthropods, initial encounter: Secondary | ICD-10-CM

## 2018-10-20 DIAGNOSIS — S30861A Insect bite (nonvenomous) of abdominal wall, initial encounter: Secondary | ICD-10-CM | POA: Diagnosis not present

## 2018-10-20 MED ORDER — DOXYCYCLINE HYCLATE 100 MG PO TABS: 100.0000 mg | ORAL_TABLET | Freq: Two times a day (BID) | ORAL | 0 refills | Status: DC

## 2018-10-20 NOTE — Progress Notes (Signed)
Patient ID: Lee Smith, male   DOB: 1945-11-06, 73 y.o.   MRN: [id number]  Virtual Visit via Video Note  I connected with Lee Smith on 10/20/18 at  9:00 AM EDT by a video enabled telemedicine application and verified that I am speaking with the correct person using two identifiers.  Location patient: home Location provider:work or home office Persons participating in the virtual visit: patient, provider  I discussed the limitations of evaluation and management by telemedicine and the availability of in person appointments. The patient expressed understanding and agreed to proceed.   HPI: Patient had called yesterday with pruritic rash left flank area.  He pulled off what sounds like a small deer tick yesterday.  They feel like they got the full tick off.  He has not had any arthralgias.  No fevers or chills.  No headaches.  He states he has an area of redness which is about 1 inch diameter and is pruritic and nonpainful.  No target lesion.  No clearing of the center.  Using topical hydrocortisone cream which helps   ROS: See pertinent positives and negatives per HPI.  Past Medical History:  Diagnosis Date  . Allergic rhinitis   . Arthritis    bilateral knees; has planned partial knee replacement next month  . Genital warts   . History of kidney stones 11/2013   lithotripsy for right ureteral stone  . Hx of colonic polyps    2008  . Hyperlipidemia   . Hypertension   . Right nephrolithiasis     Past Surgical History:  Procedure Laterality Date  . CHOLECYSTECTOMY  2002  . CYSTOSCOPY W/ URETERAL STENT PLACEMENT Right 02/12/2014   Procedure: CYSTOSCOPY WITH RETROGRADE PYELOGRAM/URETERAL STENT PLACEMENT;  Surgeon: Festus Aloe, MD;  Location: [facility];  Service: Urology;  Laterality: Right;  . CYSTOSCOPY WITH RETROGRADE PYELOGRAM, URETEROSCOPY AND STENT PLACEMENT Right 02/12/2014   Procedure: RIGHT URETEROSCOPY ;  Surgeon: Festus Aloe, MD;  Location:  [facility];  Service: Urology;  Laterality: Right;  . CYSTOSCOPY WITH URETEROSCOPY AND STENT PLACEMENT Right 03/05/2014   Procedure: CYSTOSCOPY WITH URETEROSCOPY AND STENT EXCHANGE, STONE EXTRACTION;  Surgeon: Festus Aloe, MD;  Location: [facility];  Service: Urology;  Laterality: Right;  . CYSTOSCOPY WITH URETEROSCOPY AND STENT PLACEMENT Right 03/26/2014   Procedure: CYSTO WITH RIGHT URETEROSCOPY/STENT PLACEMENT;  Surgeon: Festus Aloe, MD;  Location: [facility];  Service: Urology;  Laterality: Right;  . EXTRACORPOREAL SHOCK WAVE LITHOTRIPSY Right 12-17-2013  . HOLMIUM LASER APPLICATION Right 6/37/8588   Procedure: HOLMIUM LASER APPLICATION;  Surgeon: Festus Aloe, MD;  Location: [facility];  Service: Urology;  Laterality: Right;  . INGUINAL HERNIA REPAIR Bilateral right 2003/   left 2004  . STONE EXTRACTION WITH BASKET Right 03/26/2014   Procedure: STONE EXTRACTION WITH BASKET;  Surgeon: Festus Aloe, MD;  Location: [facility];  Service: Urology;  Laterality: Right;  . 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 UMBILICAL HERNIA REPAIR  2000    Family History  Problem Relation Age of Onset  . Cancer Father        prostate  . Cancer Sister        breast  . Arthritis Unknown        family hx  . Hyperlipidemia Unknown        family hx  . Hypertension Unknown        family hx  . Hypertension Mother  SOCIAL HX: Non-smoker.   Current Outpatient Medications:  .  acetaminophen (TYLENOL) 500 MG tablet, Take 500 mg by mouth every 6 (six) hours as needed., Disp: , Rfl:  .  albuterol (PROVENTIL HFA;VENTOLIN HFA) 108 (90 Base) MCG/ACT inhaler, Inhale 2 puffs into the lungs every 6 (six) hours as needed for wheezing or shortness of breath., Disp: 1 Inhaler, Rfl: 0 .  amLODipine (NORVASC) 5 MG tablet, TAKE ONE TABLET BY MOUTH ONCE DAILY, Disp: 90 tablet, Rfl: 1 .  aspirin EC 81 MG tablet, Take 81 mg  by mouth daily., Disp: , Rfl:  .  atorvastatin (LIPITOR) 20 MG tablet, Take 1 tablet (20 mg total) by mouth daily., Disp: 90 tablet, Rfl: 0 .  colchicine 0.6 MG tablet, Take 1 tablet (0.6 mg total) by mouth 2 (two) times daily., Disp: 60 tablet, Rfl: 0 .  losartan (COZAAR) 100 MG tablet, Take 1 tablet (100 mg total) by mouth daily., Disp: 90 tablet, Rfl: 3 .  losartan (COZAAR) 100 MG tablet, TAKE 1 TABLET BY MOUTH ONCE DAILY., Disp: 90 tablet, Rfl: 2 .  Naproxen Sodium (ALEVE) 220 MG CAPS, Take by mouth., Disp: , Rfl:  .  polyvinyl alcohol (LIQUIFILM TEARS) 1.4 % ophthalmic solution, Place 1 drop into both eyes as needed for dry eyes., Disp: , Rfl:  .  tadalafil (CIALIS) 20 MG tablet, Take 1 tablet (20 mg total) by mouth daily as needed for erectile dysfunction., Disp: 6 tablet, Rfl: 5 .  tamsulosin (FLOMAX) 0.4 MG CAPS capsule, Take 0.4 mg by mouth., Disp: , Rfl:   EXAM:  VITALS per patient if applicable:  GENERAL: alert, oriented, appears well and in no acute distress  HEENT: atraumatic, conjunttiva clear, no obvious abnormalities on inspection of external nose and ears  NECK: normal movements of the head and neck  LUNGS: on inspection no signs of respiratory distress, breathing rate appears normal, no obvious gross SOB, gasping or wheezing  CV: no obvious cyanosis  MS: moves all visible extremities without noticeable abnormality  PSYCH/NEURO: pleasant and cooperative, no obvious depression or anxiety, speech and thought processing grossly intact  ASSESSMENT AND PLAN:  Discussed the following assessment and plan:  Recent tick bite left flank.  This sounded like a deer tick.  He is describing what sounds like local allergic reaction and no evidence for erythema migrans  -Continue topical hydrocortisone cream for symptom relief -Follow-up immediately for any fever, headache, arthralgias -Reviewed issues with Lyme disease such as erythema migrans and he will watch out for any  changes -He is aware that the itching and redness may persist for several weeks     I discussed the assessment and treatment plan with the patient. The patient was provided an opportunity to ask questions and all were answered. The patient agreed with the plan and demonstrated an understanding of the instructions.   The patient was advised to call back or seek an in-person evaluation if the symptoms worsen or if the condition fails to improve as anticipated.   Carolann Littler, MD

## 2018-10-23 NOTE — Telephone Encounter (Signed)
Appointment scheduled.

## 2018-11-14 ENCOUNTER — Other Ambulatory Visit: Payer: Self-pay | Admitting: Family Medicine

## 2018-11-14 ENCOUNTER — Encounter: Payer: Self-pay | Admitting: Family Medicine

## 2018-11-15 ENCOUNTER — Ambulatory Visit (INDEPENDENT_AMBULATORY_CARE_PROVIDER_SITE_OTHER): Payer: Medicare Other | Admitting: Family Medicine

## 2018-11-15 ENCOUNTER — Ambulatory Visit: Payer: No Typology Code available for payment source | Admitting: Family Medicine

## 2018-11-15 ENCOUNTER — Other Ambulatory Visit: Payer: Self-pay

## 2018-11-15 DIAGNOSIS — Z0000 Encounter for general adult medical examination without abnormal findings: Secondary | ICD-10-CM | POA: Diagnosis not present

## 2018-11-15 DIAGNOSIS — D126 Benign neoplasm of colon, unspecified: Secondary | ICD-10-CM | POA: Diagnosis not present

## 2018-11-15 DIAGNOSIS — E785 Hyperlipidemia, unspecified: Secondary | ICD-10-CM

## 2018-11-15 DIAGNOSIS — I10 Essential (primary) hypertension: Secondary | ICD-10-CM | POA: Diagnosis not present

## 2018-11-15 MED ORDER — AMLODIPINE BESYLATE 5 MG PO TABS: 5.0000 mg | ORAL_TABLET | Freq: Every day | ORAL | 3 refills | Status: DC

## 2018-11-15 MED ORDER — MELOXICAM 15 MG PO TABS: 15.0000 mg | ORAL_TABLET | Freq: Every day | ORAL | 1 refills | Status: DC

## 2018-11-15 MED ORDER — ATORVASTATIN CALCIUM 20 MG PO TABS: 20.0000 mg | ORAL_TABLET | Freq: Every day | ORAL | 3 refills | Status: DC

## 2018-11-15 NOTE — Progress Notes (Signed)
Patient ID: Lee Smith, male   DOB: 1946-04-23, 73 y.o.   MRN: [id number]  This visit type was conducted due to national recommendations for restrictions regarding the COVID-19 pandemic in an effort to limit this patient's exposure and mitigate transmission in our community.   Virtual Visit via Video Note  I connected with Lee Smith on 11/15/18 at  8:00 AM EDT by a video enabled telemedicine application and verified that I am speaking with the correct person using two identifiers.  Location patient: home Location provider:work or home office Persons participating in the virtual visit: patient, provider  I discussed the limitations of evaluation and management by telemedicine and the availability of in person appointments. The patient expressed understanding and agreed to proceed.   HPI: Patient had video encounter today for medical follow-up and for Medicare subsequent annual wellness visit- and medical follow up.  He has hyperlipidemia and hypertension.  He needs refills of atorvastatin and amlodipine.  His blood pressures been stable.  He is walking 3 to 4 miles per day.  Feels well overall.  No history of CAD.  No history of diabetes.  No peripheral vascular disease.  No cerebrovascular disease.  He has been taking baby aspirin daily but has no clear indication at age 55 to be on this.  No history of CAD or cerebrovascular disease  1.  Risk factors based on Past Medical , Social, and Family history reviewed and as indicated above with no changes  2.  Limitations in physical activities None.  No recent falls.  Feels very stable on his feet.  He walks about 4 miles per day.  No difficulties with that.  3.  Depression/mood No active depression or anxiety issues.  PHQ-2 score of 0  4.  Hearing No deficits.  Chronic tinnitus left ear.  No recent hearing evaluation.  5.  ADLs independent in all.  6.  Cognitive function (orientation to time and place, language, writing, speech,memory)  no short or long term memory issues.  Language and judgement intact.  7.  Home Safety no issues-  8.  Height, weight, and visual acuity.all stable.  He gets eye exams every year  9.  Counseling discussed Shingrix vaccine.  He will consider.  He has had previous Zostavax.  10. Recommendation of preventive services.  Continue yearly flu vaccine.  Discussed Shingrix as above.  11. Labs based on risk factors-put in future order for lipids, hepatic, basic metabolic panel  12. Care Plan-as above  13. Other Providers- Dr Paulita Fujita, GI  33. Written schedule of screening/prevention services given to patient. -Continue yearly flu vaccine -Discussed Shingrix -Repeat colonoscopy June 2021 -Needs tetanus now.  He will consider getting this in June  #15 advanced directives.  These are in place    ROS: See pertinent positives and negatives per HPI.  Past Medical History:  Diagnosis Date  . Allergic rhinitis   . Arthritis    bilateral knees; has planned partial knee replacement next month  . Genital warts   . History of kidney stones 11/2013   lithotripsy for right ureteral stone  . Hx of colonic polyps    2008  . Hyperlipidemia   . Hypertension   . Right nephrolithiasis     Past Surgical History:  Procedure Laterality Date  . CHOLECYSTECTOMY  2002  . CYSTOSCOPY W/ URETERAL STENT PLACEMENT Right 02/12/2014   Procedure: CYSTOSCOPY WITH RETROGRADE PYELOGRAM/URETERAL STENT PLACEMENT;  Surgeon: Festus Aloe, MD;  Location: [facility];  Service: Urology;  Laterality: Right;  . CYSTOSCOPY WITH RETROGRADE PYELOGRAM, URETEROSCOPY AND STENT PLACEMENT Right 02/12/2014   Procedure: RIGHT URETEROSCOPY ;  Surgeon: Festus Aloe, MD;  Location: [facility];  Service: Urology;  Laterality: Right;  . CYSTOSCOPY WITH URETEROSCOPY AND STENT PLACEMENT Right 03/05/2014   Procedure: CYSTOSCOPY WITH URETEROSCOPY AND STENT EXCHANGE, STONE EXTRACTION;  Surgeon: Festus Aloe, MD;  Location: [facility];  Service: Urology;  Laterality: Right;  . CYSTOSCOPY WITH URETEROSCOPY AND STENT PLACEMENT Right 03/26/2014   Procedure: CYSTO WITH RIGHT URETEROSCOPY/STENT PLACEMENT;  Surgeon: Festus Aloe, MD;  Location: [facility];  Service: Urology;  Laterality: Right;  . EXTRACORPOREAL SHOCK WAVE LITHOTRIPSY Right 12-17-2013  . HOLMIUM LASER APPLICATION Right 6/73/4193   Procedure: HOLMIUM LASER APPLICATION;  Surgeon: Festus Aloe, MD;  Location: [facility];  Service: Urology;  Laterality: Right;  . INGUINAL HERNIA REPAIR Bilateral right 2003/   left 2004  . STONE EXTRACTION WITH BASKET Right 03/26/2014   Procedure: STONE EXTRACTION WITH BASKET;  Surgeon: Festus Aloe, MD;  Location: [facility];  Service: Urology;  Laterality: Right;  . 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 UMBILICAL HERNIA REPAIR  2000    Family History  Problem Relation Age of Onset  . Cancer Father        prostate  . Cancer Sister        breast  . Arthritis Unknown        family hx  . Hyperlipidemia Unknown        family hx  . Hypertension Unknown        family hx  . Hypertension Mother     SOCIAL HX: Patient quit smoking 1972.  About 6-pack-year history.  He remarried a couple years ago.  Enjoys traveling.  Enjoys woodworking.   Current Outpatient Medications:  .  acetaminophen (TYLENOL) 500 MG tablet, Take 500 mg by mouth every 6 (six) hours as needed., Disp: , Rfl:  .  albuterol (PROVENTIL HFA;VENTOLIN HFA) 108 (90 Base) MCG/ACT inhaler, Inhale 2 puffs into the lungs every 6 (six) hours as needed for wheezing or shortness of breath., Disp: 1 Inhaler, Rfl: 0 .  amLODipine (NORVASC) 5 MG tablet, Take 1 tablet (5 mg total) by mouth daily., Disp: 90 tablet, Rfl: 3 .  atorvastatin (LIPITOR) 20 MG tablet, Take 1 tablet (20 mg total) by mouth daily., Disp: 90 tablet, Rfl: 3 .  colchicine 0.6 MG tablet, Take 1  tablet (0.6 mg total) by mouth 2 (two) times daily., Disp: 60 tablet, Rfl: 0 .  doxycycline (VIBRA-TABS) 100 MG tablet, Take 1 tablet (100 mg total) by mouth 2 (two) times daily., Disp: 20 tablet, Rfl: 0 .  losartan (COZAAR) 100 MG tablet, Take 1 tablet (100 mg total) by mouth daily., Disp: 90 tablet, Rfl: 3 .  losartan (COZAAR) 100 MG tablet, TAKE 1 TABLET BY MOUTH ONCE DAILY., Disp: 90 tablet, Rfl: 2 .  meloxicam (MOBIC) 15 MG tablet, Take 1 tablet (15 mg total) by mouth daily., Disp: 90 tablet, Rfl: 1 .  Naproxen Sodium (ALEVE) 220 MG CAPS, Take by mouth., Disp: , Rfl:  .  polyvinyl alcohol (LIQUIFILM TEARS) 1.4 % ophthalmic solution, Place 1 drop into both eyes as needed for dry eyes., Disp: , Rfl:  .  tadalafil (CIALIS) 20 MG tablet, Take 1 tablet (20 mg total) by mouth daily as needed for erectile dysfunction., Disp: 6 tablet, Rfl: 5 .  tamsulosin (FLOMAX) 0.4 MG CAPS capsule, Take  0.4 mg by mouth., Disp: , Rfl:   EXAM:  VITALS per patient if applicable:  GENERAL: alert, oriented, appears well and in no acute distress  HEENT: atraumatic, conjunttiva clear, no obvious abnormalities on inspection of external nose and ears  NECK: normal movements of the head and neck  LUNGS: on inspection no signs of respiratory distress, breathing rate appears normal, no obvious gross SOB, gasping or wheezing  CV: no obvious cyanosis  MS: moves all visible extremities without noticeable abnormality  PSYCH/NEURO: pleasant and cooperative, no obvious depression or anxiety, speech and thought processing grossly intact  ASSESSMENT AND PLAN:  Discussed the following assessment and plan:  Medicare annual wellness visit, subsequent  -We will need repeat colonoscopy June 2021. -Needs tetanus booster -Other immunizations up-to-date.  We did discuss Shingrix and he will consider.  #2 hypertension.  This is been stable -Refill amlodipine for 1 year  #3 hyperlipidemia. -Refill Lipitor for 1  year -Patient will return in June for future labs with lipid panel and hepatic panel     I discussed the assessment and treatment plan with the patient. The patient was provided an opportunity to ask questions and all were answered. The patient agreed with the plan and demonstrated an understanding of the instructions.   The patient was advised to call back or seek an in-person evaluation if the symptoms worsen or if the condition fails to improve as anticipated.   Carolann Littler, MD

## 2018-11-16 MED ORDER — ATORVASTATIN CALCIUM 20 MG PO TABS: 20.0000 mg | ORAL_TABLET | Freq: Every day | ORAL | 3 refills | Status: DC

## 2018-12-06 DIAGNOSIS — L819 Disorder of pigmentation, unspecified: Secondary | ICD-10-CM | POA: Diagnosis not present

## 2018-12-06 DIAGNOSIS — L821 Other seborrheic keratosis: Secondary | ICD-10-CM | POA: Diagnosis not present

## 2018-12-06 DIAGNOSIS — L814 Other melanin hyperpigmentation: Secondary | ICD-10-CM | POA: Diagnosis not present

## 2018-12-06 DIAGNOSIS — D229 Melanocytic nevi, unspecified: Secondary | ICD-10-CM | POA: Diagnosis not present

## 2018-12-06 DIAGNOSIS — B078 Other viral warts: Secondary | ICD-10-CM | POA: Diagnosis not present

## 2018-12-06 DIAGNOSIS — D485 Neoplasm of uncertain behavior of skin: Secondary | ICD-10-CM | POA: Diagnosis not present

## 2018-12-06 DIAGNOSIS — L570 Actinic keratosis: Secondary | ICD-10-CM | POA: Diagnosis not present

## 2018-12-06 DIAGNOSIS — D1801 Hemangioma of skin and subcutaneous tissue: Secondary | ICD-10-CM | POA: Diagnosis not present

## 2018-12-06 DIAGNOSIS — L723 Sebaceous cyst: Secondary | ICD-10-CM | POA: Diagnosis not present

## 2018-12-07 DIAGNOSIS — Z96653 Presence of artificial knee joint, bilateral: Secondary | ICD-10-CM | POA: Diagnosis not present

## 2018-12-07 DIAGNOSIS — M25462 Effusion, left knee: Secondary | ICD-10-CM | POA: Diagnosis not present

## 2018-12-07 DIAGNOSIS — Z471 Aftercare following joint replacement surgery: Secondary | ICD-10-CM | POA: Diagnosis not present

## 2018-12-07 DIAGNOSIS — M25461 Effusion, right knee: Secondary | ICD-10-CM | POA: Diagnosis not present

## 2018-12-21 ENCOUNTER — Other Ambulatory Visit (INDEPENDENT_AMBULATORY_CARE_PROVIDER_SITE_OTHER): Payer: Medicare Other

## 2018-12-21 ENCOUNTER — Other Ambulatory Visit: Payer: Self-pay

## 2018-12-21 DIAGNOSIS — E785 Hyperlipidemia, unspecified: Secondary | ICD-10-CM | POA: Diagnosis not present

## 2018-12-21 DIAGNOSIS — I10 Essential (primary) hypertension: Secondary | ICD-10-CM

## 2018-12-21 LAB — LIPID PANEL
Cholesterol: 140 mg/dL (ref 0–200)
HDL: 45.2 mg/dL (ref >39.00)
LDL Cholesterol: 84 mg/dL (ref 0–99)
NonHDL: 95.11
Total CHOL/HDL Ratio: 3
Triglycerides: 57 mg/dL (ref 0.0–149.0)
VLDL: 11.4 mg/dL (ref 0.0–40.0)

## 2018-12-21 LAB — BASIC METABOLIC PANEL
BUN: 23 mg/dL (ref 6–23)
CO2: 25 mEq/L (ref 19–32)
Calcium: 9 mg/dL (ref 8.4–10.5)
Chloride: 108 mEq/L (ref 96–112)
Creatinine, Ser: 0.88 mg/dL (ref 0.40–1.50)
GFR: 84.69 mL/min (ref >60.00)
Glucose, Bld: 93 mg/dL (ref 70–99)
Potassium: 4.3 mEq/L (ref 3.5–5.1)
Sodium: 142 mEq/L (ref 135–145)

## 2018-12-21 LAB — HEPATIC FUNCTION PANEL
ALT: 14 U/L (ref 0–53)
AST: 16 U/L (ref 0–37)
Albumin: 4.1 g/dL (ref 3.5–5.2)
Alkaline Phosphatase: 60 U/L (ref 39–117)
Bilirubin, Direct: 0.2 mg/dL (ref 0.0–0.3)
Total Bilirubin: 0.7 mg/dL (ref 0.2–1.2)
Total Protein: 6.2 g/dL (ref 6.0–8.3)

## 2019-01-22 DIAGNOSIS — H43813 Vitreous degeneration, bilateral: Secondary | ICD-10-CM | POA: Diagnosis not present

## 2019-01-22 DIAGNOSIS — H35371 Puckering of macula, right eye: Secondary | ICD-10-CM | POA: Diagnosis not present

## 2019-01-22 DIAGNOSIS — H43393 Other vitreous opacities, bilateral: Secondary | ICD-10-CM | POA: Diagnosis not present

## 2019-02-09 DIAGNOSIS — N401 Enlarged prostate with lower urinary tract symptoms: Secondary | ICD-10-CM | POA: Diagnosis not present

## 2019-02-09 DIAGNOSIS — N5201 Erectile dysfunction due to arterial insufficiency: Secondary | ICD-10-CM | POA: Diagnosis not present

## 2019-02-09 DIAGNOSIS — R3912 Poor urinary stream: Secondary | ICD-10-CM | POA: Diagnosis not present

## 2019-02-10 ENCOUNTER — Other Ambulatory Visit: Payer: Self-pay | Admitting: Family Medicine

## 2019-05-10 ENCOUNTER — Ambulatory Visit (INDEPENDENT_AMBULATORY_CARE_PROVIDER_SITE_OTHER): Payer: Medicare Other

## 2019-05-10 ENCOUNTER — Other Ambulatory Visit: Payer: Self-pay

## 2019-05-10 DIAGNOSIS — Z23 Encounter for immunization: Secondary | ICD-10-CM | POA: Diagnosis not present

## 2019-05-17 ENCOUNTER — Encounter: Payer: Self-pay | Admitting: Family Medicine

## 2019-05-29 ENCOUNTER — Other Ambulatory Visit: Payer: Self-pay

## 2019-05-29 ENCOUNTER — Encounter: Payer: Self-pay | Admitting: Family Medicine

## 2019-05-29 ENCOUNTER — Ambulatory Visit (INDEPENDENT_AMBULATORY_CARE_PROVIDER_SITE_OTHER): Payer: Medicare Other | Admitting: Family Medicine

## 2019-05-29 DIAGNOSIS — R42 Dizziness and giddiness: Secondary | ICD-10-CM

## 2019-05-29 DIAGNOSIS — B356 Tinea cruris: Secondary | ICD-10-CM | POA: Diagnosis not present

## 2019-05-29 NOTE — Telephone Encounter (Signed)
Provider ended up being able to connect with pt

## 2019-05-29 NOTE — Progress Notes (Signed)
Patient ID: Lee Smith, male   DOB: 02-14-1946, 73 y.o.   MRN: [id number]  This visit type was conducted due to national recommendations for restrictions regarding the COVID-19 pandemic in an effort to limit this patient's exposure and mitigate transmission in our community.   Virtual Visit via Telephone Note  I connected with Lee Smith on 05/29/19 at  8:00 AM EST by telephone and verified that I am speaking with the correct person using two identifiers.   I discussed the limitations, risks, security and privacy concerns of performing an evaluation and management service by telephone and the availability of in person appointments. I also discussed with the patient that there may be a patient responsible charge related to this service. The patient expressed understanding and agreed to proceed.  Location patient: home Location provider: work or home office Participants present for the call: patient, provider Patient did not have a visit in the prior 7 days to address this/these issue(s).   History of Present Illness: Lee Smith has a couple things to discuss.  First, he has had some recent episodes of dizziness.  We had him discontinue amlodipine because of some low blood pressures.  Since that time his blood pressures been ranging 0000000 systolic and around 68 diastolic.  He has been drinking some 0 Gatorade which seems to be helping.  No recent syncope.  No chest pains.  Hydrating well.  Dizziness is less frequent.  No activity or exercise intolerance  Frequent recurrent symptoms of "jock itch".  He tried some type of over-the-counter powder but did not seem to help much.  He has not aware of trying anything like Lotrimin.   Observations/Objective: Patient sounds cheerful and well on the phone. I do not appreciate any SOB. Speech and thought processing are grossly intact. Patient reported vitals:  Assessment and Plan: #1 recent dizziness and lightheadedness improved after discontinuation  of amlodipine -Continue to stay well-hydrated. -Continue to monitor blood pressure and be in touch if consistently above [id number] systolic  #2 probable tinea cruris by history -Get out of damp clothing as quick as possible -Try over-the-counter Lotrimin and be in touch in 2 weeks if not improved with that  Follow Up Instructions:  -6 months   99441 5-10 99442 11-20 99443 21-30 I did not refer this patient for an OV in the next 24 hours for this/these issue(s).  I discussed the assessment and treatment plan with the patient. The patient was provided an opportunity to ask questions and all were answered. The patient agreed with the plan and demonstrated an understanding of the instructions.   The patient was advised to call back or seek an in-person evaluation if the symptoms worsen or if the condition fails to improve as anticipated.  I provided 14 minutes of non-face-to-face time during this encounter.   Carolann Littler, MD

## 2019-08-05 ENCOUNTER — Encounter: Payer: Self-pay | Admitting: Family Medicine

## 2019-08-15 DIAGNOSIS — Z23 Encounter for immunization: Secondary | ICD-10-CM | POA: Diagnosis not present

## 2019-09-05 DIAGNOSIS — Z23 Encounter for immunization: Secondary | ICD-10-CM | POA: Diagnosis not present

## 2019-11-19 DIAGNOSIS — K5730 Diverticulosis of large intestine without perforation or abscess without bleeding: Secondary | ICD-10-CM | POA: Diagnosis not present

## 2019-11-19 DIAGNOSIS — N200 Calculus of kidney: Secondary | ICD-10-CM | POA: Diagnosis not present

## 2019-11-24 ENCOUNTER — Other Ambulatory Visit: Payer: Self-pay | Admitting: Family Medicine

## 2019-11-27 ENCOUNTER — Other Ambulatory Visit: Payer: Self-pay | Admitting: Family Medicine

## 2019-11-27 DIAGNOSIS — D225 Melanocytic nevi of trunk: Secondary | ICD-10-CM | POA: Diagnosis not present

## 2019-11-27 DIAGNOSIS — D1801 Hemangioma of skin and subcutaneous tissue: Secondary | ICD-10-CM | POA: Diagnosis not present

## 2019-11-27 DIAGNOSIS — L570 Actinic keratosis: Secondary | ICD-10-CM | POA: Diagnosis not present

## 2019-11-28 MED ORDER — LOSARTAN POTASSIUM 100 MG PO TABS: 100.0000 mg | ORAL_TABLET | Freq: Every day | ORAL | 0 refills | Status: DC

## 2019-11-28 NOTE — Telephone Encounter (Signed)
Refills sent in

## 2019-11-28 NOTE — Telephone Encounter (Signed)
losartan (COZAAR) 100 MG tablet  90 days prescriptions   [place], [place] - [id number] Phone:  [phone]  Fax:  [phone]     The patient is having a hard time getting this Rx refilled he would like to have a call back.

## 2019-12-04 ENCOUNTER — Other Ambulatory Visit: Payer: Self-pay

## 2019-12-05 ENCOUNTER — Encounter: Payer: Self-pay | Admitting: Family Medicine

## 2019-12-05 ENCOUNTER — Ambulatory Visit (INDEPENDENT_AMBULATORY_CARE_PROVIDER_SITE_OTHER): Payer: Medicare Other | Admitting: Family Medicine

## 2019-12-05 VITALS — BP 130/78 | HR 70 | Temp 97.7°F | Ht 70.5 in | Wt 216.1 lb

## 2019-12-05 DIAGNOSIS — R05 Cough: Secondary | ICD-10-CM

## 2019-12-05 DIAGNOSIS — E785 Hyperlipidemia, unspecified: Secondary | ICD-10-CM

## 2019-12-05 DIAGNOSIS — I10 Essential (primary) hypertension: Secondary | ICD-10-CM | POA: Diagnosis not present

## 2019-12-05 DIAGNOSIS — Z23 Encounter for immunization: Secondary | ICD-10-CM

## 2019-12-05 DIAGNOSIS — S60511A Abrasion of right hand, initial encounter: Secondary | ICD-10-CM

## 2019-12-05 DIAGNOSIS — Z0000 Encounter for general adult medical examination without abnormal findings: Secondary | ICD-10-CM | POA: Diagnosis not present

## 2019-12-05 DIAGNOSIS — R058 Other specified cough: Secondary | ICD-10-CM

## 2019-12-05 LAB — LIPID PANEL
Cholesterol: 162 mg/dL (ref 0–200)
HDL: 49.9 mg/dL (ref >39.00)
LDL Cholesterol: 98 mg/dL (ref 0–99)
NonHDL: 111.86
Total CHOL/HDL Ratio: 3
Triglycerides: 67 mg/dL (ref 0.0–149.0)
VLDL: 13.4 mg/dL (ref 0.0–40.0)

## 2019-12-05 LAB — BASIC METABOLIC PANEL
BUN: 19 mg/dL (ref 6–23)
CO2: 28 mEq/L (ref 19–32)
Calcium: 9.2 mg/dL (ref 8.4–10.5)
Chloride: 105 mEq/L (ref 96–112)
Creatinine, Ser: 0.98 mg/dL (ref 0.40–1.50)
GFR: 74.6 mL/min (ref >60.00)
Glucose, Bld: 101 mg/dL — ABNORMAL HIGH (ref 70–99)
Potassium: 4.2 mEq/L (ref 3.5–5.1)
Sodium: 140 mEq/L (ref 135–145)

## 2019-12-05 LAB — HEPATIC FUNCTION PANEL
ALT: 18 U/L (ref 0–53)
AST: 21 U/L (ref 0–37)
Albumin: 4.3 g/dL (ref 3.5–5.2)
Alkaline Phosphatase: 64 U/L (ref 39–117)
Bilirubin, Direct: 0.2 mg/dL (ref 0.0–0.3)
Total Bilirubin: 0.8 mg/dL (ref 0.2–1.2)
Total Protein: 6.6 g/dL (ref 6.0–8.3)

## 2019-12-05 NOTE — Patient Instructions (Signed)
Health Maintenance  Topic Date Due  . TETANUS/TDAP  07/19/2017  . Hepatitis C Screening  06/04/2026 (Originally 1946-05-26)  . INFLUENZA VACCINE  02/17/2020  . COLONOSCOPY  12/23/2027  . COVID-19 Vaccine  Completed  . PNA vac Low Risk Adult  Completed    For cough:  - consider elevate head of bed 4-6 inches -avoid mint products -consider daily use of Flonase for postnasal drip -consider night time use of OTC Chlorpheneramine 4 mg  Consider Shingrix vaccine and check at pharmacy.

## 2019-12-05 NOTE — Progress Notes (Signed)
Subjective:     Patient ID: Lee Smith, male   DOB: [birth year], 74 y.o.   MRN: [id number]  HPI   Lee Smith is seen for Medicare annual wellness visit and medical follow-up.  He has hyperlipidemia treated with atorvastatin.  Due for labs soon.  Also takes losartan for hypertension.  His blood pressures been stable.  He has intentionally lost weight due to overall calorie restriction during the past year and feels much better overall.  He has had osteoarthritis with bilateral knee replacements and those are doing well.  He has BPH and takes Flomax and that seems to be controlling symptoms fairly well.  Minimal nocturia.  Recent abrasion right thumb.  This occurred a couple days ago.  His last tetanus was over 10 years ago.  He had previous Zostavax but no Shingrix vaccine.  Dry cough intermittently for the past year.  Smoked only briefly years ago.  Cough is dry and mild.  No hemoptysis.  No dyspnea.  No wheezing.  Occasional mild reflux symptoms.  Does use peppermint intermittently.  He is aware of increased postnasal drip especially at night.  Not consistently using Flonase.  No ACE inhibitor use.  Past Medical History:  Diagnosis Date  . Allergic rhinitis   . Arthritis    bilateral knees; has planned partial knee replacement next month  . Genital warts   . History of kidney stones 11/2013   lithotripsy for right ureteral stone  . Hx of colonic polyps    2008  . Hyperlipidemia   . Hypertension   . Right nephrolithiasis    Past Surgical History:  Procedure Laterality Date  . CHOLECYSTECTOMY  2002  . CYSTOSCOPY W/ URETERAL STENT PLACEMENT Right 02/12/2014   Procedure: CYSTOSCOPY WITH RETROGRADE PYELOGRAM/URETERAL STENT PLACEMENT;  Surgeon: Festus Aloe, MD;  Location: [facility];  Service: Urology;  Laterality: Right;  . CYSTOSCOPY WITH RETROGRADE PYELOGRAM, URETEROSCOPY AND STENT PLACEMENT Right 02/12/2014   Procedure: RIGHT URETEROSCOPY ;  Surgeon: Festus Aloe,  MD;  Location: [facility];  Service: Urology;  Laterality: Right;  . CYSTOSCOPY WITH URETEROSCOPY AND STENT PLACEMENT Right 03/05/2014   Procedure: CYSTOSCOPY WITH URETEROSCOPY AND STENT EXCHANGE, STONE EXTRACTION;  Surgeon: Festus Aloe, MD;  Location: [facility];  Service: Urology;  Laterality: Right;  . CYSTOSCOPY WITH URETEROSCOPY AND STENT PLACEMENT Right 03/26/2014   Procedure: CYSTO WITH RIGHT URETEROSCOPY/STENT PLACEMENT;  Surgeon: Festus Aloe, MD;  Location: [facility];  Service: Urology;  Laterality: Right;  . EXTRACORPOREAL SHOCK WAVE LITHOTRIPSY Right 12-17-2013  . HOLMIUM LASER APPLICATION Right [id number]   Procedure: HOLMIUM LASER APPLICATION;  Surgeon: Festus Aloe, MD;  Location: [facility];  Service: Urology;  Laterality: Right;  . INGUINAL HERNIA REPAIR Bilateral right 2003/   left 2004  . STONE EXTRACTION WITH BASKET Right 03/26/2014   Procedure: STONE EXTRACTION WITH BASKET;  Surgeon: Festus Aloe, MD;  Location[facility];  Service: Urology;  Laterality: Right;  . 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 UMBILICAL HERNIA REPAIR  2000    reports that he quit smoking about 49 years ago. His smoking use included cigarettes. He has a 6.00 pack-year smoking history. He has quit using smokeless tobacco.  His smokeless tobacco use included chew. He reports current alcohol use of about 7.0 standard drinks of alcohol per week. He reports that he does not use drugs. family history includes Arthritis in his unknown relative; Cancer in his father and sister; Hyperlipidemia  in his unknown relative; Hypertension in his mother and unknown relative. No Known Allergies  Wt Readings from Last 3 Encounters:  12/05/19 216 lb 1.6 oz (98 kg)  11/09/17 238 lb 14.4 oz (108.4 kg)  11/09/17 238 lb (108 kg)    1.  Risk factors based on Past Medical , Social, and Family history reviewed and as indicated  above with no changes  2.  Limitations in physical activities None.  No recent falls.  Hx of bilateral knee replacements and doing well with those.    3.  Depression/mood No active depression or anxiety issues PHQ 2 equals 0  4.  Hearing No deficits  5.  ADLs independent in all.  6.  Cognitive function (orientation to time and place, language, writing, speech,memory) no short or long term memory issues.  Language and judgement intact.  7.  Home Safety no issues  8.  Height, weight, and visual acuity.all stable.  Intentional weight loss this year.  Good appetite.  Wt Readings from Last 3 Encounters:  12/05/19 216 lb 1.6 oz (98 kg)  11/09/17 238 lb 14.4 oz (108.4 kg)  11/09/17 238 lb (108 kg)    9.  Counseling -Counseled regarding age and gender appropriate preventative screenings and immunizations.  10. Recommendation of preventive services.  Consider Shingrix vaccine this year and check at pharmacy.  11. Labs based on risk factors- lipids, hepatic, BMP  12. Care Plan- as below.  13. Other Providers  Urology- Dr Junious Silk.  14. Written schedule of screening/prevention services given to patient. Health Maintenance  Topic Date Due  . Hepatitis C Screening  06/04/2026 (Originally June 21, 1946)  . INFLUENZA VACCINE  02/17/2020  . COLONOSCOPY  12/23/2027  . TETANUS/TDAP  12/04/2029  . COVID-19 Vaccine  Completed  . PNA vac Low Risk Adult  Completed      Review of Systems  Constitutional: Negative for activity change, appetite change, fatigue and fever.  HENT: Negative for congestion, ear pain and trouble swallowing.   Eyes: Negative for pain and visual disturbance.  Respiratory: Negative for cough, shortness of breath and wheezing.   Cardiovascular: Negative for chest pain and palpitations.  Gastrointestinal: Negative for abdominal distention, abdominal pain, blood in stool, constipation, diarrhea, nausea, rectal pain and vomiting.  Genitourinary: Negative for dysuria,  hematuria and testicular pain.  Musculoskeletal: Negative for arthralgias and joint swelling.  Skin: Negative for rash.  Neurological: Negative for dizziness, syncope and headaches.  Hematological: Negative for adenopathy.  Psychiatric/Behavioral: Negative for confusion and dysphoric mood.       Objective:   Physical Exam Constitutional:      General: He is not in acute distress.    Appearance: He is well-developed.  HENT:     Head: Normocephalic and atraumatic.     Right Ear: External ear normal.     Left Ear: External ear normal.  Eyes:     Conjunctiva/sclera: Conjunctivae normal.     Pupils: Pupils are equal, round, and reactive to light.  Neck:     Thyroid: No thyromegaly.  Cardiovascular:     Rate and Rhythm: Normal rate and regular rhythm.     Heart sounds: Normal heart sounds. No murmur.  Pulmonary:     Effort: No respiratory distress.     Breath sounds: No wheezing or rales.  Abdominal:     General: Bowel sounds are normal. There is no distension.     Palpations: Abdomen is soft. There is no mass.     Tenderness: There is no  abdominal tenderness. There is no guarding or rebound.  Musculoskeletal:     Cervical back: Normal range of motion and neck supple.  Lymphadenopathy:     Cervical: No cervical adenopathy.  Skin:    Findings: No rash.  Neurological:     Mental Status: He is alert and oriented to person, place, and time.     Cranial Nerves: No cranial nerve deficit.     Deep Tendon Reflexes: Reflexes normal.        Assessment:     #1 Medicare subsequent annual wellness visit.  Addressed health maintenance issues below  #2 abrasion right hand.  No signs of secondary infection.  Needs tetanus booster  #3 hyperlipidemia treated with Lipitor  #4 hypertension stable on losartan  #5 BPH symptomatically stable on tamsulosin  #6 dry cough intermittently past year.  He has had some weight loss but this was intentional.  No other concerning symptoms.   Possibly related to silent reflux versus postnasal drip    Plan:     -Avoid eating within 3 hours of bedtime -Avoid mint products -Consider nighttime use of Flonase and chlorpheniramine for postnasal drip symptoms -If cough not improved in 2 to 3 weeks touch base and would consider chest x-ray  -Discussed Shingrix vaccine with pharmacy -DT given -Follow-up labs including lipid, hepatic, basic metabolic panel  [name] MD [facility]

## 2019-12-10 NOTE — Telephone Encounter (Signed)
Please advise 

## 2020-02-06 DIAGNOSIS — M79671 Pain in right foot: Secondary | ICD-10-CM | POA: Diagnosis not present

## 2020-02-22 DIAGNOSIS — N401 Enlarged prostate with lower urinary tract symptoms: Secondary | ICD-10-CM | POA: Diagnosis not present

## 2020-02-22 DIAGNOSIS — N200 Calculus of kidney: Secondary | ICD-10-CM | POA: Diagnosis not present

## 2020-02-22 DIAGNOSIS — R350 Frequency of micturition: Secondary | ICD-10-CM | POA: Diagnosis not present

## 2020-02-23 ENCOUNTER — Other Ambulatory Visit: Payer: Self-pay | Admitting: Family Medicine

## 2020-03-23 ENCOUNTER — Other Ambulatory Visit: Payer: Self-pay | Admitting: Family Medicine

## 2020-03-28 ENCOUNTER — Telehealth: Payer: Self-pay | Admitting: Family Medicine

## 2020-03-28 MED ORDER — ATORVASTATIN CALCIUM 20 MG PO TABS: 20.0000 mg | ORAL_TABLET | Freq: Every day | ORAL | 0 refills | Status: DC

## 2020-03-28 NOTE — Telephone Encounter (Signed)
Pt is calling about his prescription   atorvastatin (LIPITOR) 20 MG tablet   [place], [place] [address]  [place] [place] [address]  Phone:  [phone] Fax:  [phone]    He is out and called for it last Sunday  Please advise

## 2020-04-02 DIAGNOSIS — K639 Disease of intestine, unspecified: Secondary | ICD-10-CM | POA: Diagnosis not present

## 2020-04-02 DIAGNOSIS — K648 Other hemorrhoids: Secondary | ICD-10-CM | POA: Diagnosis not present

## 2020-04-02 DIAGNOSIS — Z86010 Personal history of colonic polyps: Secondary | ICD-10-CM | POA: Diagnosis not present

## 2020-04-19 DIAGNOSIS — Z23 Encounter for immunization: Secondary | ICD-10-CM | POA: Diagnosis not present

## 2020-04-25 DIAGNOSIS — Z23 Encounter for immunization: Secondary | ICD-10-CM | POA: Diagnosis not present

## 2020-05-09 DIAGNOSIS — Z1159 Encounter for screening for other viral diseases: Secondary | ICD-10-CM | POA: Diagnosis not present

## 2020-05-14 DIAGNOSIS — Z86010 Personal history of colonic polyps: Secondary | ICD-10-CM | POA: Diagnosis not present

## 2020-05-14 DIAGNOSIS — D123 Benign neoplasm of transverse colon: Secondary | ICD-10-CM | POA: Diagnosis not present

## 2020-05-14 DIAGNOSIS — D122 Benign neoplasm of ascending colon: Secondary | ICD-10-CM | POA: Diagnosis not present

## 2020-05-14 DIAGNOSIS — K5730 Diverticulosis of large intestine without perforation or abscess without bleeding: Secondary | ICD-10-CM | POA: Diagnosis not present

## 2020-05-16 DIAGNOSIS — D122 Benign neoplasm of ascending colon: Secondary | ICD-10-CM | POA: Diagnosis not present

## 2020-05-16 DIAGNOSIS — D123 Benign neoplasm of transverse colon: Secondary | ICD-10-CM | POA: Diagnosis not present

## 2020-05-20 DIAGNOSIS — N402 Nodular prostate without lower urinary tract symptoms: Secondary | ICD-10-CM | POA: Diagnosis not present

## 2020-05-20 DIAGNOSIS — N401 Enlarged prostate with lower urinary tract symptoms: Secondary | ICD-10-CM | POA: Diagnosis not present

## 2020-05-20 DIAGNOSIS — R350 Frequency of micturition: Secondary | ICD-10-CM | POA: Diagnosis not present

## 2020-05-29 DIAGNOSIS — L819 Disorder of pigmentation, unspecified: Secondary | ICD-10-CM | POA: Diagnosis not present

## 2020-05-29 DIAGNOSIS — L720 Epidermal cyst: Secondary | ICD-10-CM | POA: Diagnosis not present

## 2020-05-29 DIAGNOSIS — D1801 Hemangioma of skin and subcutaneous tissue: Secondary | ICD-10-CM | POA: Diagnosis not present

## 2020-05-29 DIAGNOSIS — L821 Other seborrheic keratosis: Secondary | ICD-10-CM | POA: Diagnosis not present

## 2020-05-29 DIAGNOSIS — L812 Freckles: Secondary | ICD-10-CM | POA: Diagnosis not present

## 2020-05-29 DIAGNOSIS — L570 Actinic keratosis: Secondary | ICD-10-CM | POA: Diagnosis not present

## 2020-05-29 DIAGNOSIS — L814 Other melanin hyperpigmentation: Secondary | ICD-10-CM | POA: Diagnosis not present

## 2020-05-29 DIAGNOSIS — D229 Melanocytic nevi, unspecified: Secondary | ICD-10-CM | POA: Diagnosis not present

## 2020-06-20 ENCOUNTER — Ambulatory Visit (INDEPENDENT_AMBULATORY_CARE_PROVIDER_SITE_OTHER): Payer: Medicare Other | Admitting: Family Medicine

## 2020-06-20 ENCOUNTER — Other Ambulatory Visit: Payer: Self-pay

## 2020-06-20 VITALS — BP 116/76 | HR 71 | Temp 98.2°F | Resp 16 | Ht 72.0 in | Wt 222.2 lb

## 2020-06-20 DIAGNOSIS — R3589 Other polyuria: Secondary | ICD-10-CM | POA: Diagnosis not present

## 2020-06-20 DIAGNOSIS — R202 Paresthesia of skin: Secondary | ICD-10-CM | POA: Diagnosis not present

## 2020-06-20 NOTE — Progress Notes (Signed)
Established Patient Office Visit  Subjective:  Patient ID: Lee Smith, male    DOB: [birth year]  Age: 74 y.o. MRN: [id number]  CC:  Chief Complaint  Patient presents with  . toes going numb    right pinky toe x 1 year, great toe left foot just started, lips started this summer off and on    HPI Lee Smith presents for intermittent paresthesias.  Lee Smith noticed about a year ago his right second toe had some intermittent tingling sensation distally.  Lee Smith recently noticed some left great toe symptoms as well.  [name] more of a tingling that a total numbness.  Lee Smith not so noted any associated weakness.  No upper extremity symptoms.  Lee Smith had an episode about a year ago where Lee Smith had some transient numbness of his upper lip without any associated swelling.  Has recently had some intermittent numbness involving both lips but again no evidence for edema and no facial weakness.  Lee Smith has some mild chronic polyuria but no polydipsia.  No recent significant weight changes.  No history of diabetes.  No recent thyroid functions.  No specific risk factors for B12 deficiency other than age.  Lee Smith does state his mom had history of B12 deficiency.  No chronic PPI use.  Drinks about 4 ounces of wine about 4 to 5 days/week.  Non-smoker.  Denies any recent headaches.  No confusion.  No focal weakness.  Lee Smith takes atorvastatin for hyperlipidemia.  Lee Smith has hypertension treated with losartan.  Lee Smith has BPH treated with tamsulosin.  Past Medical History:  Diagnosis Date  . Allergic rhinitis   . Arthritis    bilateral knees; has planned partial knee replacement next month  . Genital warts   . History of kidney stones 11/2013   lithotripsy for right ureteral stone  . Hx of colonic polyps    2008  . Hyperlipidemia   . Hypertension   . Right nephrolithiasis     Past Surgical History:  Procedure Laterality Date  . CHOLECYSTECTOMY  2002  . CYSTOSCOPY W/ URETERAL STENT PLACEMENT Right 02/12/2014   Procedure:  CYSTOSCOPY WITH RETROGRADE PYELOGRAM/URETERAL STENT PLACEMENT;  Surgeon: Festus Aloe, MD;  Location[facility];  Service: Urology;  Laterality: Right;  . CYSTOSCOPY WITH RETROGRADE PYELOGRAM, URETEROSCOPY AND STENT PLACEMENT Right 02/12/2014   Procedure: RIGHT URETEROSCOPY ;  Surgeon: Festus Aloe, MD;  Location[facility];  Service: Urology;  Laterality: Right;  . CYSTOSCOPY WITH URETEROSCOPY AND STENT PLACEMENT Right 03/05/2014   Procedure: CYSTOSCOPY WITH URETEROSCOPY AND STENT EXCHANGE, STONE EXTRACTION;  Surgeon: Festus Aloe, MD;  Location: [facility];  Service: Urology;  Laterality: Right;  . CYSTOSCOPY WITH URETEROSCOPY AND STENT PLACEMENT Right 03/26/2014   Procedure: CYSTO WITH RIGHT URETEROSCOPY/STENT PLACEMENT;  Surgeon: Festus Aloe, MD;  Location: [facility];  Service: Urology;  Laterality: Right;  . EXTRACORPOREAL SHOCK WAVE LITHOTRIPSY Right 12-17-2013  . HOLMIUM LASER APPLICATION Right 4/53/6468   Procedure: HOLMIUM LASER APPLICATION;  Surgeon: Festus Aloe, MD;  Location: [facility];  Service: Urology;  Laterality: Right;  . INGUINAL HERNIA REPAIR Bilateral right 2003/   left 2004  . STONE EXTRACTION WITH BASKET Right 03/26/2014   Procedure: STONE EXTRACTION WITH BASKET;  Surgeon: Festus Aloe, MD;  Location: [facility];  Service: Urology;  Laterality: Right;  . 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 UMBILICAL HERNIA REPAIR  2000    Family History  Problem Relation  Age of Onset  . Cancer Father        prostate  . Cancer Sister        breast  . Arthritis Unknown        family hx  . Hyperlipidemia Unknown        family hx  . Hypertension Unknown        family hx  . Hypertension Mother     Social History   Socioeconomic History  . Marital status: Married    Spouse name: Not on file  . Number of children: Not on file  . Years of education: Not on  file  . Highest education level: Not on file  Occupational History  . Not on file  Tobacco Use  . Smoking status: Former Smoker    Packs/day: 1.00    Years: 6.00    Pack years: 6.00    Types: Cigarettes    Quit date: 10/12/1970    Years since quitting: 49.7  . Smokeless tobacco: Former Systems developer    Types: Chew  Substance and Sexual Activity  . Alcohol use: Yes    Alcohol/week: 7.0 standard drinks    Types: 7 Glasses of wine per week    Comment: never drinks more than one per day  . Drug use: No  . Sexual activity: Not on file  Other Topics Concern  . Not on file  Social History Narrative  . Not on file   Social Determinants of Health   Financial Resource Strain:   . Difficulty of Paying Living Expenses: Not on file  Food Insecurity:   . Worried About Charity fundraiser in the Last Year: Not on file  . Ran Out of Food in the Last Year: Not on file  Transportation Needs:   . Lack of Transportation (Medical): Not on file  . Lack of Transportation (Non-Medical): Not on file  Physical Activity:   . Days of Exercise per Week: Not on file  . Minutes of Exercise per Session: Not on file  Stress:   . Feeling of Stress : Not on file  Social Connections:   . Frequency of Communication with Friends and Family: Not on file  . Frequency of Social Gatherings with Friends and Family: Not on file  . Attends Religious Services: Not on file  . Active Member of Clubs or Organizations: Not on file  . Attends Archivist Meetings: Not on file  . Marital Status: Not on file  Intimate Partner Violence:   . Fear of Current or Ex-Partner: Not on file  . Emotionally Abused: Not on file  . Physically Abused: Not on file  . Sexually Abused: Not on file    Outpatient Medications Prior to Visit  Medication Sig Dispense Refill  . acetaminophen (TYLENOL) 500 MG tablet Take 500 mg by mouth every 6 (six) hours as needed.    Marland Kitchen albuterol (PROVENTIL HFA;VENTOLIN HFA) 108 (90 Base) MCG/ACT  inhaler Inhale 2 puffs into the lungs every 6 (six) hours as needed for wheezing or shortness of breath. 1 Inhaler 0  . atorvastatin (LIPITOR) 20 MG tablet Take 1 tablet (20 mg total) by mouth daily. 90 tablet 0  . losartan (COZAAR) 100 MG tablet Take 1 tablet by mouth once daily 90 tablet 2  . meloxicam (MOBIC) 15 MG tablet Take 1 tablet (15 mg total) by mouth daily. 90 tablet 1  . Naproxen Sodium (ALEVE) 220 MG CAPS Take by mouth.    . tadalafil (CIALIS) 20  MG tablet Take 1 tablet (20 mg total) by mouth daily as needed for erectile dysfunction. 6 tablet 5  . tamsulosin (FLOMAX) 0.4 MG CAPS capsule Take 0.4 mg by mouth.     No facility-administered medications prior to visit.    No Known Allergies  ROS Review of Systems  Constitutional: Negative for appetite change, chills, fever and unexpected weight change.  Respiratory: Negative for shortness of breath.   Cardiovascular: Negative for chest pain.  Endocrine: Negative for polydipsia.  Neurological: Positive for numbness. Negative for dizziness, seizures, syncope, facial asymmetry, speech difficulty, weakness and headaches.      Objective:    Physical Exam Vitals reviewed.  Constitutional:      Appearance: Normal appearance.  Cardiovascular:     Rate and Rhythm: Normal rate and regular rhythm.     Comments: Both feet are warm to touch with good capillary refill and good distal pulses Pulmonary:     Effort: Pulmonary effort Smith normal.     Breath sounds: Normal breath sounds.  Musculoskeletal:     Right lower leg: No edema.     Left lower leg: No edema.  Skin:    Findings: No rash.  Neurological:     Mental Status: Lee Smith alert.     Comments: Lee Smith has intact sensation with monofilament throughout both subjectively slightly diminished right second toe and left great toe.  Does seem to have decreased vibratory sensation in both feet  Full strength lower and upper extremities.  Lee Smith has symmetric reflexes upper and lower  extremities     BP 116/76   Pulse 71   Temp 98.2 F (36.8 C) (Oral)   Resp 16   Ht 6' (1.829 m)   Wt 222 lb 3.2 oz (100.8 kg)   SpO2 99%   BMI 30.14 kg/m  Wt Readings from Last 3 Encounters:  06/20/20 222 lb 3.2 oz (100.8 kg)  12/05/19 216 lb 1.6 oz (98 kg)  11/09/17 238 lb 14.4 oz (108.4 kg)     There are no preventive care reminders to display for this patient.  There are no preventive care reminders to display for this patient.  Lab Results  Component Value Date   TSH 1.86 05/07/2016   Lab Results  Component Value Date   WBC 9.6 11/22/2013   HGB 13.6 03/05/2014   HCT 40.0 03/05/2014   MCV 94.6 11/22/2013   PLT 234 11/22/2013   Lab Results  Component Value Date   NA 140 12/05/2019   K 4.2 12/05/2019   CO2 28 12/05/2019   GLUCOSE 101 (H) 12/05/2019   BUN 19 12/05/2019   CREATININE 0.98 12/05/2019   BILITOT 0.8 12/05/2019   ALKPHOS 64 12/05/2019   AST 21 12/05/2019   ALT 18 12/05/2019   PROT 6.6 12/05/2019   ALBUMIN 4.3 12/05/2019   CALCIUM 9.2 12/05/2019   GFR 74.60 12/05/2019   Lab Results  Component Value Date   CHOL 162 12/05/2019   Lab Results  Component Value Date   HDL 49.90 12/05/2019   Lab Results  Component Value Date   LDLCALC 98 12/05/2019   Lab Results  Component Value Date   TRIG 67.0 12/05/2019   Lab Results  Component Value Date   CHOLHDL 3 12/05/2019   No results found for: HGBA1C    Assessment & Plan:   Problem List Items Addressed This Visit    None    Visit Diagnoses    Paresthesias    -  Primary  Relevant Orders   Basic metabolic panel   TSH   Vitamin B12   Protein,Total and Electrophor w/IFE   Polyuria        Patient relates paresthesias involving right second toe and left great toe for the past year.  No history of diabetes.  No motor weakness.  -Start with checking insulin labs including basic metabolic panel, TSH, C76, and multiple myeloma panel -Watch for any progressive symptoms such as  progressive numbness or any new weakness  No orders of the defined types were placed in this encounter.   Follow-up: No follow-ups on file.    Carolann Littler, MD

## 2020-06-20 NOTE — Patient Instructions (Signed)
Paresthesia Paresthesia is an abnormal burning or prickling sensation. It is usually felt in the hands, arms, legs, or feet. However, it may occur in any part of the body. Usually, paresthesia is not painful. It may feel like:  Tingling or numbness.  Buzzing.  Itching. Paresthesia may occur without any clear cause, or it may be caused by:  Breathing too quickly (hyperventilation).  Pressure on a nerve.  An underlying medical condition.  Side effects of a medication.  Nutritional deficiencies.  Exposure to toxic chemicals. Most people experience temporary (transient) paresthesia at some time in their lives. For some people, it may be long-lasting (chronic) because of an underlying medical condition. If you have paresthesia that lasts a long time, you may need to be evaluated by your health care provider. Follow these instructions at home: Alcohol use   Do not drink alcohol if: ? Your health care provider tells you not to drink. ? You are pregnant, may be pregnant, or are planning to become pregnant.  If you drink alcohol: ? Limit how much you use to:  0-1 drink a day for women.  0-2 drinks a day for men. ? Be aware of how much alcohol is in your drink. In the [place] one drink equals one 12 oz bottle of beer (355 mL), one 5 oz glass of wine (148 mL), or one 1 oz glass of hard liquor (44 mL). Nutrition   Eat a healthy diet. This includes: ? Eating foods that are high in fiber, such as fresh fruits and vegetables, whole grains, and beans. ? Limiting foods that are high in fat and processed sugars, such as fried or sweet foods. General instructions  Take over-the-counter and prescription medicines only as told by your health care provider.  Do not use any products that contain nicotine or tobacco, such as cigarettes and e-cigarettes. These can keep blood from reaching damaged nerves. If you need help quitting, ask your health care provider.  If you have diabetes, work  closely with your health care provider to keep your blood sugar under control.  If you have numbness in your feet: ? Check every day for signs of injury or infection. Watch for redness, warmth, and swelling. ? Wear padded socks and comfortable shoes. These help protect your feet.  Keep all follow-up visits as told by your health care provider. This is important. Contact a health care provider if you:  Have paresthesia that gets worse or does not go away.  Have a burning or prickling feeling that gets worse when you walk.  Have pain, cramps, or dizziness.  Develop a rash. Get help right away if you:  Feel weak.  Have trouble walking or moving.  Have problems with speech, understanding, or vision.  Feel confused.  Cannot control your bladder or bowel movements.  Have numbness after an injury.  Develop new weakness in an arm or leg.  Faint. Summary  Paresthesia is an abnormal burning or prickling sensation that is usually felt in the hands, arms, legs, or feet. It may also occur in other parts of the body.  Paresthesia may occur without any clear cause, or it may be caused by breathing too quickly (hyperventilation), pressure on a nerve, an underlying medical condition, side effects of a medication, nutritional deficiencies, or exposure to toxic chemicals.  If you have paresthesia that lasts a long time, you may need to be evaluated by your health care provider. This information is not intended to replace advice given to you by   your health care provider. Make sure you discuss any questions you have with your health care provider. Document Revised: 07/31/2018 Document Reviewed: 07/14/2017 Elsevier Patient Education  2020 Elsevier Inc.  

## 2020-06-23 NOTE — Progress Notes (Signed)
Thyroid normal.  Glucose normal    B12 is low end of normal.   Multiple myeloma panel pending.  Recommend start OTC B12 1,000 mcg daily.  Will be in touch after other lab back.

## 2020-06-24 LAB — PROTEIN,TOTAL AND ELECTROPHOR W/IFE
Albumin ELP: 3.9 g/dL (ref 3.8–4.8)
Alpha 1: 0.3 g/dL (ref 0.2–0.3)
Alpha 2: 0.6 g/dL (ref 0.5–0.9)
Beta 2: 0.3 g/dL (ref 0.2–0.5)
Beta Globulin: 0.4 g/dL (ref 0.4–0.6)
Gamma Globulin: 0.9 g/dL (ref 0.8–1.7)
Immunofix Electr Int: NOT DETECTED
Total Protein: 6.4 g/dL (ref 6.1–8.1)

## 2020-06-24 LAB — BASIC METABOLIC PANEL
BUN: 16 mg/dL (ref 7–25)
CO2: 26 mmol/L (ref 20–32)
Calcium: 9.1 mg/dL (ref 8.6–10.3)
Chloride: 107 mmol/L (ref 98–110)
Creat: 0.87 mg/dL (ref 0.70–1.18)
Glucose, Bld: 92 mg/dL (ref 65–99)
Potassium: 4.6 mmol/L (ref 3.5–5.3)
Sodium: 139 mmol/L (ref 135–146)

## 2020-06-24 LAB — TSH: TSH: 2.14 mIU/L (ref 0.40–4.50)

## 2020-06-24 LAB — VITAMIN B12: Vitamin B-12: 247 pg/mL (ref 200–1100)

## 2020-06-25 ENCOUNTER — Encounter: Payer: Self-pay | Admitting: Family Medicine

## 2020-07-05 ENCOUNTER — Other Ambulatory Visit: Payer: Self-pay | Admitting: Family Medicine

## 2020-09-28 ENCOUNTER — Encounter: Payer: Self-pay | Admitting: Family Medicine

## 2020-09-29 ENCOUNTER — Other Ambulatory Visit: Payer: Self-pay

## 2020-09-29 MED ORDER — ATORVASTATIN CALCIUM 20 MG PO TABS
20.0000 mg | ORAL_TABLET | Freq: Every day | ORAL | 0 refills | Status: DC
Start: 2020-09-29 — End: 2021-01-07

## 2020-10-08 DIAGNOSIS — H9191 Unspecified hearing loss, right ear: Secondary | ICD-10-CM | POA: Diagnosis not present

## 2020-10-08 DIAGNOSIS — H6983 Other specified disorders of Eustachian tube, bilateral: Secondary | ICD-10-CM | POA: Diagnosis not present

## 2020-10-13 DIAGNOSIS — H6983 Other specified disorders of Eustachian tube, bilateral: Secondary | ICD-10-CM | POA: Diagnosis not present

## 2020-10-13 DIAGNOSIS — H9191 Unspecified hearing loss, right ear: Secondary | ICD-10-CM | POA: Diagnosis not present

## 2020-10-13 DIAGNOSIS — I10 Essential (primary) hypertension: Secondary | ICD-10-CM | POA: Diagnosis not present

## 2020-10-21 DIAGNOSIS — H903 Sensorineural hearing loss, bilateral: Secondary | ICD-10-CM | POA: Diagnosis not present

## 2020-10-21 DIAGNOSIS — H9313 Tinnitus, bilateral: Secondary | ICD-10-CM | POA: Diagnosis not present

## 2020-10-24 DIAGNOSIS — H9313 Tinnitus, bilateral: Secondary | ICD-10-CM | POA: Diagnosis not present

## 2020-10-24 DIAGNOSIS — H9121 Sudden idiopathic hearing loss, right ear: Secondary | ICD-10-CM | POA: Diagnosis not present

## 2020-11-06 DIAGNOSIS — H903 Sensorineural hearing loss, bilateral: Secondary | ICD-10-CM | POA: Diagnosis not present

## 2020-11-07 DIAGNOSIS — H9121 Sudden idiopathic hearing loss, right ear: Secondary | ICD-10-CM | POA: Diagnosis not present

## 2020-11-07 DIAGNOSIS — H903 Sensorineural hearing loss, bilateral: Secondary | ICD-10-CM | POA: Diagnosis not present

## 2020-11-14 DIAGNOSIS — H903 Sensorineural hearing loss, bilateral: Secondary | ICD-10-CM | POA: Diagnosis not present

## 2020-11-14 DIAGNOSIS — H9121 Sudden idiopathic hearing loss, right ear: Secondary | ICD-10-CM | POA: Diagnosis not present

## 2020-11-14 DIAGNOSIS — H9313 Tinnitus, bilateral: Secondary | ICD-10-CM | POA: Diagnosis not present

## 2020-11-20 DIAGNOSIS — H903 Sensorineural hearing loss, bilateral: Secondary | ICD-10-CM | POA: Diagnosis not present

## 2020-11-20 DIAGNOSIS — H9313 Tinnitus, bilateral: Secondary | ICD-10-CM | POA: Diagnosis not present

## 2020-11-20 DIAGNOSIS — H938X1 Other specified disorders of right ear: Secondary | ICD-10-CM | POA: Diagnosis not present

## 2020-11-20 DIAGNOSIS — H9121 Sudden idiopathic hearing loss, right ear: Secondary | ICD-10-CM | POA: Diagnosis not present

## 2020-11-26 DIAGNOSIS — L738 Other specified follicular disorders: Secondary | ICD-10-CM | POA: Diagnosis not present

## 2020-11-26 DIAGNOSIS — L718 Other rosacea: Secondary | ICD-10-CM | POA: Diagnosis not present

## 2020-11-26 DIAGNOSIS — L570 Actinic keratosis: Secondary | ICD-10-CM | POA: Diagnosis not present

## 2020-11-28 ENCOUNTER — Telehealth: Payer: Self-pay | Admitting: Family Medicine

## 2020-11-28 NOTE — Telephone Encounter (Signed)
Spoke with patient to  schedule Medicare Annual Wellness Visit (AWV) either virtually or in office.  He has appointment with [place] and will call back to schedule  Last AWV  12/05/19 please schedule at anytime with [facility] Nurse Health Advisor 1 or 2   This should be a 45 minute visit.

## 2020-12-09 ENCOUNTER — Encounter: Payer: Self-pay | Admitting: Family Medicine

## 2020-12-10 MED ORDER — LOSARTAN POTASSIUM 100 MG PO TABS
1.0000 | ORAL_TABLET | Freq: Every day | ORAL | 2 refills | Status: DC
Start: 2020-12-10 — End: 2021-05-18

## 2020-12-18 DIAGNOSIS — H9313 Tinnitus, bilateral: Secondary | ICD-10-CM | POA: Diagnosis not present

## 2020-12-18 DIAGNOSIS — H903 Sensorineural hearing loss, bilateral: Secondary | ICD-10-CM | POA: Diagnosis not present

## 2020-12-18 DIAGNOSIS — H7201 Central perforation of tympanic membrane, right ear: Secondary | ICD-10-CM | POA: Diagnosis not present

## 2020-12-18 DIAGNOSIS — H7203 Central perforation of tympanic membrane, bilateral: Secondary | ICD-10-CM | POA: Diagnosis not present

## 2021-01-07 ENCOUNTER — Other Ambulatory Visit: Payer: Self-pay

## 2021-01-07 ENCOUNTER — Telehealth (INDEPENDENT_AMBULATORY_CARE_PROVIDER_SITE_OTHER): Payer: Medicare Other | Admitting: Family Medicine

## 2021-01-07 DIAGNOSIS — H9121 Sudden idiopathic hearing loss, right ear: Secondary | ICD-10-CM

## 2021-01-07 DIAGNOSIS — E785 Hyperlipidemia, unspecified: Secondary | ICD-10-CM

## 2021-01-07 DIAGNOSIS — R062 Wheezing: Secondary | ICD-10-CM

## 2021-01-07 DIAGNOSIS — R0982 Postnasal drip: Secondary | ICD-10-CM

## 2021-01-07 MED ORDER — PREDNISONE 20 MG PO TABS: ORAL_TABLET | ORAL | 0 refills | Status: DC

## 2021-01-07 MED ORDER — ALBUTEROL SULFATE HFA 108 (90 BASE) MCG/ACT IN AERS: 2.0000 | INHALATION_SPRAY | Freq: Four times a day (QID) | RESPIRATORY_TRACT | 0 refills | Status: DC | PRN

## 2021-01-07 MED ORDER — ATORVASTATIN CALCIUM 20 MG PO TABS
20.0000 mg | ORAL_TABLET | Freq: Every day | ORAL | 0 refills | Status: DC
Start: 2021-01-07 — End: 2021-04-10

## 2021-01-07 NOTE — Progress Notes (Signed)
This visit type was conducted due to national recommendations for restrictions regarding the COVID-19 pandemic in an effort to limit this patient's exposure and mitigate transmission in our community.   Virtual Visit via Video Note  I connected with [name] on 01/07/21 at  2:30 PM EDT by a video enabled telemedicine application and verified that I am speaking with the correct person using two identifiers.  Location patient: home Location provider:work or home office Persons participating in the virtual visit: patient, provider  I discussed the limitations of evaluation and management by telemedicine and the availability of in person appointments. The patient expressed understanding and agreed to proceed.   HPI: [name] relates about 3 weeks of nasal congestion and postnasal drip symptoms.  He states that back in March he was down in [place] and had sudden right-sided hearing loss.  He went to physician then and was treated with prednisone without any improvement hearing.  He saw ENT at follow-up here and had another course of prednisone but has not recovered any hearing from that ear.  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  Now relates 3 weeks of clear nasal mucus.  Frequent postnasal drip symptoms.  Intermittent sore throat.  COVID test x2 negative.  No fever.  No purulent secretions.  No facial pain.  No headaches.  He does feel like he has some mild wheezing at night especially.  Some cough.  No hemoptysis.  No appetite or weight changes.  Has tried Flonase and Zyrtec without much improvement. Requesting refill of albuterol  History of hyperlipidemia treated with atorvastatin.  Last labs for lipids were May 2021.  He has pending appointment at the [facility] of this month.  He hopes to get labs then.  Does need refills of atorvastatin at this time.   ROS: See pertinent positives and negatives per HPI.  Past Medical History:  Diagnosis Date   Allergic rhinitis     Arthritis    bilateral knees; has planned partial knee replacement next month   Genital warts    History of kidney stones 11/2013   lithotripsy for right ureteral stone   Hx of colonic polyps    2008   Hyperlipidemia    Hypertension    Right nephrolithiasis     Past Surgical History:  Procedure Laterality Date   CHOLECYSTECTOMY  2002   CYSTOSCOPY W/ URETERAL STENT PLACEMENT Right 02/12/2014   Procedure: CYSTOSCOPY WITH RETROGRADE PYELOGRAM/URETERAL STENT PLACEMENT;  Surgeon: Festus Aloe, MD;  Location: [facility];  Service: Urology;  Laterality: Right;   CYSTOSCOPY WITH RETROGRADE PYELOGRAM, URETEROSCOPY AND STENT PLACEMENT Right 02/12/2014   Procedure: RIGHT URETEROSCOPY ;  Surgeon: Festus Aloe, MD;  Location: [facility];  Service: Urology;  Laterality: Right;   CYSTOSCOPY WITH URETEROSCOPY AND STENT PLACEMENT Right 03/05/2014   Procedure: CYSTOSCOPY WITH URETEROSCOPY AND STENT EXCHANGE, STONE EXTRACTION;  Surgeon: Festus Aloe, MD;  Location: [facility];  Service: Urology;  Laterality: Right;   CYSTOSCOPY WITH URETEROSCOPY AND STENT PLACEMENT Right 03/26/2014   Procedure: CYSTO WITH RIGHT URETEROSCOPY/STENT PLACEMENT;  Surgeon: Festus Aloe, MD;  Location: [facility];  Service: Urology;  Laterality: Right;   EXTRACORPOREAL SHOCK WAVE LITHOTRIPSY Right 12-17-2013   HOLMIUM LASER APPLICATION Right 12/14/4130   Procedure: HOLMIUM LASER APPLICATION;  Surgeon: Festus Aloe, MD;  Location: [facility];  Service: Urology;  Laterality: Right;   INGUINAL HERNIA REPAIR Bilateral right 2003/   left 2004  STONE EXTRACTION WITH BASKET Right 03/26/2014   Procedure: STONE EXTRACTION WITH BASKET;  Surgeon: Festus Aloe, MD;  Location: [facility];  Service: Urology;  Laterality: Right;   TOTAL KNEE ARTHROPLASTY Left 43/32/9518   UMBILICAL HERNIA REPAIR  2000    Family History   Problem Relation Age of Onset   Cancer Father        prostate   Cancer Sister        breast   Arthritis Unknown        family hx   Hyperlipidemia Unknown        family hx   Hypertension Unknown        family hx   Hypertension Mother     SOCIAL HX: Quit smoking 1972   Current Outpatient Medications:    acetaminophen (TYLENOL) 500 MG tablet, Take 500 mg by mouth every 6 (six) hours as needed., Disp: , Rfl:    losartan (COZAAR) 100 MG tablet, Take 1 tablet (100 mg total) by mouth daily., Disp: 90 tablet, Rfl: 2   meloxicam (MOBIC) 15 MG tablet, Take 1 tablet (15 mg total) by mouth daily., Disp: 90 tablet, Rfl: 1   Naproxen Sodium 220 MG CAPS, Take by mouth., Disp: , Rfl:    predniSONE (DELTASONE) 20 MG tablet, Take 2 tablets by mouth once daily for 7 days, Disp: 14 tablet, Rfl: 0   tadalafil (CIALIS) 20 MG tablet, Take 1 tablet (20 mg total) by mouth daily as needed for erectile dysfunction., Disp: 6 tablet, Rfl: 5   tamsulosin (FLOMAX) 0.4 MG CAPS capsule, Take 0.4 mg by mouth., Disp: , Rfl:    albuterol (VENTOLIN HFA) 108 (90 Base) MCG/ACT inhaler, Inhale 2 puffs into the lungs every 6 (six) hours as needed for wheezing or shortness of breath., Disp: 1 each, Rfl: 0   atorvastatin (LIPITOR) 20 MG tablet, Take 1 tablet (20 mg total) by mouth daily., Disp: 90 tablet, Rfl: 0  EXAM:  VITALS per patient if applicable:  GENERAL: alert, oriented, appears well and in no acute distress  HEENT: atraumatic, conjunttiva clear, no obvious abnormalities on inspection of external nose and ears  NECK: normal movements of the head and neck  LUNGS: on inspection no signs of respiratory distress, breathing rate appears normal, no obvious gross SOB, gasping or wheezing  CV: no obvious cyanosis  MS: moves all visible extremities without noticeable abnormality  PSYCH/NEURO: pleasant and cooperative, no obvious depression or anxiety, speech and thought processing grossly intact  ASSESSMENT AND  PLAN:  Discussed the following assessment and plan:  #1 3-week history of persistent nasal congestion and postnasal drip symptoms.  Question allergic.  He does not have any obvious signs or symptoms of acute sinusitis.  He has already tried [facility] and Zyrtec without much relief. -Recommend trial of prednisone 20 mg 2 tablets daily for 7 days.  Be in touch if symptoms not clearing with that.  -Also sent in refill of albuterol inhaler to use as needed. -Follow-up promptly for any fever or worsening symptoms  #2 hyperlipidemia.  Treated with atorvastatin.  Tolerating well. -Refill atorvastatin -Does need follow-up lipids and he plans to get these at the [place] on the 30th.  We have asked that he bring a copy of his labs with him for follow-up to scanned into our chart  #3 recent sudden hearing loss right ear.  Sounds like sudden sensorineural hearing loss.  Unfortunately, he did not respond to high-dose steroids.  Followed by ENT.  #4  relates some expiratory wheezing.  Probably related to #1. Patient in no respiratory distress  -Prednisone as above and refilling albuterol.  Follow-up for persistent symptoms.     I discussed the assessment and treatment plan with the patient. The patient was provided an opportunity to ask questions and all were answered. The patient agreed with the plan and demonstrated an understanding of the instructions.   The patient was advised to call back or seek an in-person evaluation if the symptoms worsen or if the condition fails to improve as anticipated.     Carolann Littler, MD

## 2021-01-15 ENCOUNTER — Encounter: Payer: Self-pay | Admitting: Family Medicine

## 2021-01-22 ENCOUNTER — Encounter: Payer: Self-pay | Admitting: Neurology

## 2021-02-05 DIAGNOSIS — S90561A Insect bite (nonvenomous), right ankle, initial encounter: Secondary | ICD-10-CM | POA: Diagnosis not present

## 2021-02-17 ENCOUNTER — Ambulatory Visit (INDEPENDENT_AMBULATORY_CARE_PROVIDER_SITE_OTHER): Payer: Medicare Other | Admitting: Podiatry

## 2021-02-17 ENCOUNTER — Other Ambulatory Visit: Payer: Self-pay

## 2021-02-17 DIAGNOSIS — M79676 Pain in unspecified toe(s): Secondary | ICD-10-CM | POA: Diagnosis not present

## 2021-02-17 DIAGNOSIS — L600 Ingrowing nail: Secondary | ICD-10-CM

## 2021-02-17 NOTE — Progress Notes (Signed)
  Subjective:  Patient ID: Lee Smith, male    DOB: 1946/04/11,  MRN: [id number]  Chief Complaint  Patient presents with   Ingrown Toenail    Right great ingrown x 3-4 days. Edema, redness and soreness. No drainage or bleeding. Pt states pain has improved but still wants ingrown removed and toe checked.    75 y.o. male presents with the above complaint. History confirmed with patient.   Objective:  Physical Exam: warm, good capillary refill, no trophic changes or ulcerative lesions, normal DP and PT pulses, and normal sensory exam.  Painful ingrowing nail at  medial border of the right, hallux; local warmth noted and local erythema noted  Assessment:   1. Ingrown nail   2. Pain around toenail    Plan:  Patient was evaluated and treated and all questions answered.  Ingrown Nail, right -Patient elects to proceed with ingrown toenail removal today -Ingrown nail excised. See procedure note. Procedure: Excision of Ingrown Toenail Location: Right 1st toe  medial border Anesthesia: Lidocaine 1% plain; 62m, digital block. Skin Prep: Betadine. Dressing: Silvadene; telfa; dry, sterile, compression dressing. Technique: Following skin prep, the toe was exsanguinated and a tourniquet was secured at the base of the toe. The affected nail border was freed, split with a nail splitter, and excised. The area was cleansed. The tourniquet was then removed and sterile dressing applied. Disposition: Patient tolerated procedure well.  Return if symptoms worsen or fail to improve.

## 2021-02-17 NOTE — Patient Instructions (Signed)

## 2021-03-17 ENCOUNTER — Encounter: Payer: Self-pay | Admitting: Family Medicine

## 2021-03-17 ENCOUNTER — Other Ambulatory Visit: Payer: Self-pay

## 2021-03-17 ENCOUNTER — Ambulatory Visit (INDEPENDENT_AMBULATORY_CARE_PROVIDER_SITE_OTHER): Payer: Medicare Other | Admitting: Family Medicine

## 2021-03-17 VITALS — HR 75 | Temp 98.0°F | Wt 229.3 lb

## 2021-03-17 DIAGNOSIS — L299 Pruritus, unspecified: Secondary | ICD-10-CM

## 2021-03-17 MED ORDER — TRIAMCINOLONE ACETONIDE 0.1 % EX CREA: 1.0000 "application " | TOPICAL_CREAM | Freq: Two times a day (BID) | CUTANEOUS | 0 refills | Status: DC

## 2021-03-17 MED ORDER — METHYLPREDNISOLONE ACETATE 80 MG/ML IJ SUSP
80.0000 mg | Freq: Once | INTRAMUSCULAR | Status: AC
  Administered 2021-03-17: 80 mg via INTRAMUSCULAR

## 2021-03-17 NOTE — Progress Notes (Signed)
prurit  Established Patient Office Visit  Subjective:  Patient ID: Lee Smith, male    DOB: 1946-05-23  Age: 75 y.o. MRN: [id number]  CC:  Chief Complaint  Patient presents with   Rash    Itching on entire torso, not so much on extremities, visible rash at waistline but no other location, x 3 weeks    HPI Lee Smith presents for pruritus involving his trunk for past 3 weeks.  Just got back from a trip to [place].  He has pruritus with no clear triggers other than he noticed this is worse in the heat.  Denies any changes soaps or detergents.  He started vitamin D about a month ago but no other new medications.  Also had Moderna booster about a month ago.  Has never had any hives.  No real visible rash.  He has tried Benadryl with minimal relief.  He has sparing of upper and lower extremities as well as head and neck.  Has not tried any topicals.  He does recall couple of bee stings this summer but had localized swelling only with those which eventually resolved.  Past Medical History:  Diagnosis Date   Allergic rhinitis    Arthritis    bilateral knees; has planned partial knee replacement next month   Genital warts    History of kidney stones 11/2013   lithotripsy for right ureteral stone   Hx of colonic polyps    2008   Hyperlipidemia    Hypertension    Right nephrolithiasis     Past Surgical History:  Procedure Laterality Date   CHOLECYSTECTOMY  2002   CYSTOSCOPY W/ URETERAL STENT PLACEMENT Right 02/12/2014   Procedure: CYSTOSCOPY WITH RETROGRADE PYELOGRAM/URETERAL STENT PLACEMENT;  Surgeon: Festus Aloe, MD;  Location: [facility];  Service: Urology;  Laterality: Right;   CYSTOSCOPY WITH RETROGRADE PYELOGRAM, URETEROSCOPY AND STENT PLACEMENT Right 02/12/2014   Procedure: RIGHT URETEROSCOPY ;  Surgeon: Festus Aloe, MD;  [facility];  Service: Urology;  Laterality: Right;   CYSTOSCOPY WITH URETEROSCOPY AND STENT  PLACEMENT Right 03/05/2014   Procedure: CYSTOSCOPY WITH URETEROSCOPY AND STENT EXCHANGE, STONE EXTRACTION;  Surgeon: Festus Aloe, MD;  Location: [facility];  Service: Urology;  Laterality: Right;   CYSTOSCOPY WITH URETEROSCOPY AND STENT PLACEMENT Right 03/26/2014   Procedure: CYSTO WITH RIGHT URETEROSCOPY/STENT PLACEMENT;  Surgeon: Festus Aloe, MD;  Location: [facility];  Service: Urology;  Laterality: Right;   EXTRACORPOREAL SHOCK WAVE LITHOTRIPSY Right 12-17-2013   HOLMIUM LASER APPLICATION Right [id number]   Procedure: HOLMIUM LASER APPLICATION;  Surgeon: Festus Aloe, MD;  Location: [facility];  Service: Urology;  Laterality: Right;   INGUINAL HERNIA REPAIR Bilateral right 2003/   left 2004   STONE EXTRACTION WITH BASKET Right 03/26/2014   Procedure: STONE EXTRACTION WITH BASKET;  Surgeon: Festus Aloe, MD;  Location: [facility];  Service: Urology;  Laterality: Right;   TOTAL KNEE ARTHROPLASTY Left [id number]   UMBILICAL HERNIA REPAIR  2000    Family History  Problem Relation Age of Onset   Cancer Father        prostate   Cancer Sister        breast   Arthritis Unknown        family hx   Hyperlipidemia Unknown        family hx   Hypertension Unknown        family hx   Hypertension Mother  Social History   Socioeconomic History   Marital status: Married    Spouse name: Not on file   Number of children: Not on file   Years of education: Not on file   Highest education level: Not on file  Occupational History   Not on file  Tobacco Use   Smoking status: Former    Packs/day: 1.00    Years: 6.00    Pack years: 6.00    Types: Cigarettes    Quit date: 10/12/1970    Years since quitting: 50.4   Smokeless tobacco: Former    Types: Chew  Substance and Sexual Activity   Alcohol use: Yes    Alcohol/week: 7.0 standard drinks    Types: 7 Glasses of wine per week    Comment: never drinks more than  one per day   Drug use: No   Sexual activity: Not on file  Other Topics Concern   Not on file  Social History Narrative   Not on file   Social Determinants of Health   Financial Resource Strain: Not on file  Food Insecurity: Not on file  Transportation Needs: Not on file  Physical Activity: Not on file  Stress: Not on file  Social Connections: Not on file  Intimate Partner Violence: Not on file    Outpatient Medications Prior to Visit  Medication Sig Dispense Refill   acetaminophen (TYLENOL) 500 MG tablet Take 500 mg by mouth every 6 (six) hours as needed.     albuterol (VENTOLIN HFA) 108 (90 Base) MCG/ACT inhaler Inhale 2 puffs into the lungs every 6 (six) hours as needed for wheezing or shortness of breath. 1 each 0   atorvastatin (LIPITOR) 20 MG tablet Take 1 tablet (20 mg total) by mouth daily. 90 tablet 0   losartan (COZAAR) 100 MG tablet Take 1 tablet (100 mg total) by mouth daily. 90 tablet 2   meloxicam (MOBIC) 15 MG tablet Take 1 tablet (15 mg total) by mouth daily. 90 tablet 1   Naproxen Sodium 220 MG CAPS Take by mouth.     tadalafil (CIALIS) 20 MG tablet Take 1 tablet (20 mg total) by mouth daily as needed for erectile dysfunction. 6 tablet 5   tamsulosin (FLOMAX) 0.4 MG CAPS capsule Take 0.4 mg by mouth.     No facility-administered medications prior to visit.    No Known Allergies  ROS Review of Systems  Constitutional:  Negative for chills and fever.  HENT:  Negative for sore throat.   Respiratory:  Negative for shortness of breath.   Skin:  Negative for rash.  Hematological:  Negative for adenopathy.     Objective:    Physical Exam Vitals reviewed.  Constitutional:      Appearance: Normal appearance.  Cardiovascular:     Rate and Rhythm: Normal rate and regular rhythm.  Skin:    Comments: Skin exam reveals no urticaria.  Skin is not particularly dry to touch.  No excoriations.  Only area of erythema is around his waistline but this looks like some  mild redness from elasticity around the waist but no true urticaria and no vesicles.  Neurological:     Mental Status: He is alert.    Pulse 75   Temp 98 F (36.7 C) (Oral)   Wt 229 lb 4.8 oz (104 kg)   SpO2 97%   BMI 31.10 kg/m  Wt Readings from Last 3 Encounters:  03/17/21 229 lb 4.8 oz (104 kg)  06/20/20 222 lb 3.2 oz (  100.8 kg)  12/05/19 216 lb 1.6 oz (98 kg)     Health Maintenance Due  Topic Date Due   INFLUENZA VACCINE  02/16/2021    There are no preventive care reminders to display for this patient.  Lab Results  Component Value Date   TSH 2.14 06/20/2020   Lab Results  Component Value Date   WBC 9.6 11/22/2013   HGB 13.6 03/05/2014   HCT 40.0 03/05/2014   MCV 94.6 11/22/2013   PLT 234 11/22/2013   Lab Results  Component Value Date   NA 139 06/20/2020   K 4.6 06/20/2020   CO2 26 06/20/2020   GLUCOSE 92 06/20/2020   BUN 16 06/20/2020   CREATININE 0.87 06/20/2020   BILITOT 0.8 12/05/2019   ALKPHOS 64 12/05/2019   AST 21 12/05/2019   ALT 18 12/05/2019   PROT 6.4 06/20/2020   ALBUMIN 4.3 12/05/2019   CALCIUM 9.1 06/20/2020   GFR 74.60 12/05/2019   Lab Results  Component Value Date   CHOL 162 12/05/2019   Lab Results  Component Value Date   HDL 49.90 12/05/2019   Lab Results  Component Value Date   LDLCALC 98 12/05/2019   Lab Results  Component Value Date   TRIG 67.0 12/05/2019   Lab Results  Component Value Date   CHOLHDL 3 12/05/2019   No results found for: HGBA1C    Assessment & Plan:   Pruritus.  Etiology unclear.  No evidence clinically to suggest scabies.  No visible rash at this time.  No recent change of soap or detergent.  -Cautioned about potential anticholinergic side effects from Benadryl.  Consider antihistamine with lesser anticholinergic side effects such as Zyrtec or Allegra -Avoid heat as much as possible -Consider anti-itch soap such as Aveeno -Triamcinolone 0.1% cream to use twice daily as needed -Has had  fairly severe symptoms so we decided to go and give Depo-Medrol 80 mg IM.  Meds ordered this encounter  Medications   triamcinolone cream (KENALOG) 0.1 %    Sig: Apply 1 application topically 2 (two) times daily. Apply to affected areas bid as needed.    Dispense:  453 g    Refill:  0    Follow-up: No follow-ups on file.    Carolann Littler, MD

## 2021-03-17 NOTE — Patient Instructions (Signed)
Consider trial of Aveeno soap  Try to avoid scratching as much as possible

## 2021-03-20 ENCOUNTER — Encounter: Payer: Self-pay | Admitting: Family Medicine

## 2021-03-24 ENCOUNTER — Other Ambulatory Visit: Payer: Self-pay

## 2021-03-24 ENCOUNTER — Ambulatory Visit (INDEPENDENT_AMBULATORY_CARE_PROVIDER_SITE_OTHER): Payer: Medicare Other | Admitting: Neurology

## 2021-03-24 ENCOUNTER — Encounter: Payer: Self-pay | Admitting: Neurology

## 2021-03-24 ENCOUNTER — Other Ambulatory Visit (INDEPENDENT_AMBULATORY_CARE_PROVIDER_SITE_OTHER): Payer: Medicare Other

## 2021-03-24 VITALS — BP 151/94 | HR 75 | Ht 72.0 in | Wt 229.0 lb

## 2021-03-24 DIAGNOSIS — G43109 Migraine with aura, not intractable, without status migrainosus: Secondary | ICD-10-CM | POA: Diagnosis not present

## 2021-03-24 DIAGNOSIS — E538 Deficiency of other specified B group vitamins: Secondary | ICD-10-CM | POA: Diagnosis not present

## 2021-03-24 DIAGNOSIS — E639 Nutritional deficiency, unspecified: Secondary | ICD-10-CM | POA: Diagnosis not present

## 2021-03-24 DIAGNOSIS — G250 Essential tremor: Secondary | ICD-10-CM

## 2021-03-24 LAB — B12 AND FOLATE PANEL
Folate: 7.7 ng/mL (ref >5.9)
Vitamin B-12: 1096 pg/mL — ABNORMAL HIGH (ref 211–911)

## 2021-03-24 NOTE — Progress Notes (Signed)
[facility] Clinic Note - Initial Visit   Date: 03/24/21  [name] MRN: [id number] DOB: [birth year]   Dear Kristopher Oppenheim, PA:  Thank you for your kind referral of Lee Smith for consultation of left foot numbness. Although his history is well known to you, please allow Lee Smith to reiterate it for the purpose of our medical record. The patient was accompanied to the clinic by self.    History of Present Illness: Lee Smith is a 75 y.o. right-handed male with hypertension and  hyperlipidemia presenting for evaluation of toe numbness and tremors.    He has numbness and tingling involving the right 2nd and 3rd toes for the past several years.  He mentioned this to his PCP who found that his vitamin B12 was low-normal.  Since starting vitamin B12 1075mg daily, the tingling has resolved, but continues to have numbness, albeit less intense.  Numbness does not extends into the other toes or higher into the feet.  He has not seen podiatry.  No problems with weakness in this feet or imbalance.  He walks daily for 2.5 miles.  He also has numbness involving both lips, which has been present for the past year. No problems with swallowing or talking.   During the summer of 2022, he has about 5-6 spells of right hand tremor, which would occur when he was trying to do fine motor tasks, such as turning pages when reading.  He was able to resolve it by resting the hand.  He has not had this occur in the past 6 weeks.   He also complains of visual changes involving the eyes where he sees jagged lines, periodic sharp pain involving the right temple.  This occurs a few times per year.   He drinks one glass of wine nightly. He is retired from [employer]    Out-side paper records, electronic medical record, and images have been reviewed where available and summarized as:  Lab Results  Component Value Date   VITAMINB12 247 06/20/2020   Lab  Results  Component Value Date   TSH 2.14 06/20/2020    Past Medical History:  Diagnosis Date   Allergic rhinitis    Arthritis    bilateral knees; has planned partial knee replacement next month   Genital warts    History of kidney stones 11/2013   lithotripsy for right ureteral stone   Hx of colonic polyps    2008   Hyperlipidemia    Hypertension    Right nephrolithiasis     Past Surgical History:  Procedure Laterality Date   CHOLECYSTECTOMY  2002   CYSTOSCOPY W/ URETERAL STENT PLACEMENT Right 02/12/2014   Procedure: CYSTOSCOPY WITH RETROGRADE PYELOGRAM/URETERAL STENT PLACEMENT;  Surgeon: MFestus Aloe MD;  [facility]  Service: Urology;  Laterality: Right;   CYSTOSCOPY WITH RETROGRADE PYELOGRAM, URETEROSCOPY AND STENT PLACEMENT Right 02/12/2014   Procedure: RIGHT URETEROSCOPY ;  Surgeon: MFestus Aloe MD;  [facility]  Service: Urology;  Laterality: Right;   CYSTOSCOPY WITH URETEROSCOPY AND STENT PLACEMENT Right 03/05/2014   Procedure: CYSTOSCOPY WITH URETEROSCOPY AND STENT EXCHANGE, STONE EXTRACTION;  Surgeon: MFestus Aloe MD;  [facility]  Service: Urology;  Laterality: Right;   CYSTOSCOPY WITH URETEROSCOPY AND STENT PLACEMENT Right 03/26/2014   Procedure: CYSTO WITH RIGHT URETEROSCOPY/STENT PLACEMENT;  Surgeon: MFestus Aloe MD;  [facility]  Service: Urology;  Laterality: Right;   EXTRACORPOREAL SHOCK  WAVE LITHOTRIPSY Right 12-17-2013   HOLMIUM LASER APPLICATION Right [id number]   Procedure: HOLMIUM LASER APPLICATION;  Surgeon: Festus Aloe, MD;  [facility];  Service: Urology;  Laterality: Right;   INGUINAL HERNIA REPAIR Bilateral right 2003/   left 2004   STONE EXTRACTION WITH BASKET Right 03/26/2014   Procedure: STONE EXTRACTION WITH BASKET;  Surgeon: Festus Aloe, MD;  [facility];  Service: Urology;   Laterality: Right;   TOTAL KNEE ARTHROPLASTY Left [id number]   UMBILICAL HERNIA REPAIR  2000     Medications:  Outpatient Encounter Medications as of 03/24/2021  Medication Sig   acetaminophen (TYLENOL) 500 MG tablet Take 500 mg by mouth every 6 (six) hours as needed.   albuterol (VENTOLIN HFA) 108 (90 Base) MCG/ACT inhaler Inhale 2 puffs into the lungs every 6 (six) hours as needed for wheezing or shortness of breath.   atorvastatin (LIPITOR) 20 MG tablet Take 1 tablet (20 mg total) by mouth daily.   cetirizine (ZYRTEC) 10 MG tablet Take 10 mg by mouth daily.   Cholecalciferol (D3-50 PO) Take by mouth.   fluticasone (FLONASE) 50 MCG/ACT nasal spray Place into both nostrils daily.   losartan (COZAAR) 100 MG tablet Take 1 tablet (100 mg total) by mouth daily.   meloxicam (MOBIC) 15 MG tablet Take 1 tablet (15 mg total) by mouth daily.   Naproxen Sodium 220 MG CAPS Take by mouth.   tadalafil (CIALIS) 20 MG tablet Take 1 tablet (20 mg total) by mouth daily as needed for erectile dysfunction.   tadalafil (CIALIS) 20 MG tablet tadalafil 20 mg tablet  Take 1 tablet every day by oral route.   tamsulosin (FLOMAX) 0.4 MG CAPS capsule Take 0.4 mg by mouth.   triamcinolone cream (KENALOG) 0.1 % Apply 1 application topically 2 (two) times daily. Apply to affected areas bid as needed.   vitamin B-12 (CYANOCOBALAMIN) 500 MCG tablet Take 2 tablets by mouth daily.   No facility-administered encounter medications on file as of 03/24/2021.    Allergies: No Known Allergies  Family History: Family History  Problem Relation Age of Onset   Hypertension Mother    Cancer Father        prostate   Cancer Sister        breast   Arthritis Other        family hx   Hyperlipidemia Other        family hx   Hypertension Other        family hx    Social History: Social History   Tobacco Use   Smoking status: Former    Packs/day: 1.00    Years: 6.00    Pack years: 6.00    Types: Cigarettes    Quit  date: 10/12/1970    Years since quitting: 50.4   Smokeless tobacco: Former    Types: Chew  Substance Use Topics   Alcohol use: Yes    Alcohol/week: 7.0 standard drinks    Types: 7 Glasses of wine per week    Comment: never drinks more than one per day   Drug use: No   Social History   Social History Narrative   Right Handed    Lives in a two story home with a basement.   Has 1 daughter    Vital Signs:  BP (!) 151/94   Pulse 75   Ht 6' (1.829 m)   Wt 229 lb (103.9 kg)   SpO2 96%   BMI 31.06  kg/m    Neurological Exam: MENTAL STATUS including orientation to time, place, person, recent and remote memory, attention span and concentration, language, and fund of knowledge is normal.  Speech is not dysarthric.  CRANIAL NERVES: II:  No visual field defects.   III-IV-VI: Pupils equal round and reactive to light.  Normal conjugate, extra-ocular eye movements in all directions of gaze.  No nystagmus.  No ptosis.   V:  Normal facial sensation.    VII:  Normal facial symmetry and movements.   VIII:  Reduced hearing bilaterally to finger rub.    IX-X:  Normal palatal movement.   XI:  Normal shoulder shrug and head rotation.   XII:  Normal tongue strength and range of motion, no deviation or fasciculation.  MOTOR:  Mild intention tremor in the hands at end-point.  No resting tremor. No atrophy or fasciculations.  No pronator drift.   Upper Extremity:  Right  Left  Deltoid  5/5   5/5   Biceps  5/5   5/5   Triceps  5/5   5/5   Infraspinatus 5/5  5/5  Medial pectoralis 5/5  5/5  Wrist extensors  5/5   5/5   Wrist flexors  5/5   5/5   Finger extensors  5/5   5/5   Finger flexors  5/5   5/5   Dorsal interossei  5/5   5/5   Abductor pollicis  5/5   5/5   Tone (Ashworth scale)  0  0   Lower Extremity:  Right  Left  Hip flexors  5/5   5/5   Hip extensors  5/5   5/5   Adductor 5/5  5/5  Abductor 5/5  5/5  Knee flexors  5/5   5/5   Knee extensors  5/5   5/5   Dorsiflexors  5/5    5/5   Plantarflexors  5/5   5/5   Toe extensors  5/5   5/5   Toe flexors  5/5   5/5   Tone (Ashworth scale)  0  0   MSRs:  Right        Left                  brachioradialis 2+  2+  biceps 2+  2+  triceps 2+  2+  patellar 2+  2+  ankle jerk 1+  1+  Hoffman no  no  plantar response down  down   SENSORY:  Vibration diminished at the great toe bilaterally, normal at the ankles.  Temperature and pin prick intact in the feet and lower legs.  Mild sway with Rhomberg testing.  COORDINATION/GAIT: Normal finger-to- nose-finger.  Intact rapid alternating movements bilaterally.  Gait narrow based and stable. Stressed gait intact, unsteady with tandem gait.   IMPRESSION: Benign essential tremor, very mild and intermittent.  Patient was reassured that he does not have any signs of Parkinson's disease - TSH is normal - Continue to monitor, no role for medications as symptoms are very intermittent and not interfering with quality of life  2.   Toe numbness involving the 2rd and 3rd toe on the right.  Given the very local symptoms, this may be more due to Morton's neuroma than neuropathy.  His vibration distally is reduced which can be age-appropriate findings.  I offered NCS/EMG of the legs to better characterize the nature of his symptoms, however, he would like to hold on testing since symptoms are not bothersome.  Symptoms did get  slightly better after being supplemented with B12.  I will check vitamin B12, vitamin B1, and folate.   3.  Ocular migraine manifesting with scintillating scotoma, infrequent.    - Monitor   Return to clinic as needed   Thank you for allowing me to participate in patient's care.  If I can answer any additional questions, I would be pleased to do so.    Sincerely,    Lind Ausley K. Posey Pronto, DO

## 2021-03-24 NOTE — Patient Instructions (Addendum)
Check labs. Results will be posted to [facility] [place].   Return to clinic as neded

## 2021-03-27 ENCOUNTER — Encounter: Payer: Self-pay | Admitting: Family Medicine

## 2021-03-28 LAB — VITAMIN B1: Vitamin B1 (Thiamine): <6 nmol/L — ABNORMAL LOW (ref 8–30)

## 2021-04-02 ENCOUNTER — Other Ambulatory Visit: Payer: Self-pay

## 2021-04-03 ENCOUNTER — Ambulatory Visit (INDEPENDENT_AMBULATORY_CARE_PROVIDER_SITE_OTHER): Payer: Medicare Other | Admitting: Family Medicine

## 2021-04-03 VITALS — BP 130/80 | HR 65 | Temp 98.0°F | Wt 233.1 lb

## 2021-04-03 DIAGNOSIS — L299 Pruritus, unspecified: Secondary | ICD-10-CM

## 2021-04-03 NOTE — Patient Instructions (Signed)
Try the Triamcinolone cream twice daily for the next couple of weeks and especially at night.    If not better in two weeks let me know and we might need to set up dermatology referral.

## 2021-04-03 NOTE — Progress Notes (Signed)
Established Patient Office Visit  Subjective:  Patient ID: Lee Smith, male    DOB: February 11, 1946  Age: 75 y.o. MRN: [id number]  CC:  Chief Complaint  Patient presents with   Follow-up    Rash, comes and goes randomly    HPI Lee Smith presents for pruritus with very faint rash which is confined to the trunk.  Refer to previous note for detail.  This started several weeks ago.  No clear etiology.  No changes soaps or detergent.  Interestingly, he has had sparing of sun exposed areas such as arms and legs.  His rash is somewhat intermittent.  Not very bothersome today but fairly severe yesterday.  He is taking Zyrtec 1 daily and also using Aveeno soap.  We had instructed him to avoid anticholinergic medications such as Benadryl because of his BPH.  Would also not be a great candidate for medication such as doxepin because of that  No history of any known food allergies.  No change in medication.  His wife may be using a new type of sheet for the dryer.  He has gotten some relief with topical triamcinolone cream but using very sporadically.  He tends to avoid long or very hot showers  Past Medical History:  Diagnosis Date   Allergic rhinitis    Arthritis    bilateral knees; has planned partial knee replacement next month   Genital warts    History of kidney stones 11/2013   lithotripsy for right ureteral stone   Hx of colonic polyps    2008   Hyperlipidemia    Hypertension    Right nephrolithiasis     Past Surgical History:  Procedure Laterality Date   CHOLECYSTECTOMY  2002   CYSTOSCOPY W/ URETERAL STENT PLACEMENT Right 02/12/2014   Procedure: CYSTOSCOPY WITH RETROGRADE PYELOGRAM/URETERAL STENT PLACEMENT;  Surgeon: Festus Aloe, MD;  Location: [facility];  Service: Urology;  Laterality: Right;   CYSTOSCOPY WITH RETROGRADE PYELOGRAM, URETEROSCOPY AND STENT PLACEMENT Right 02/12/2014   Procedure: RIGHT URETEROSCOPY ;  Surgeon: Festus Aloe, MD;   Location: [facility];  Service: Urology;  Laterality: Right;   CYSTOSCOPY WITH URETEROSCOPY AND STENT PLACEMENT Right 03/05/2014   Procedure: CYSTOSCOPY WITH URETEROSCOPY AND STENT EXCHANGE, STONE EXTRACTION;  Surgeon: Festus Aloe, MD;  Location[facility];  Service: Urology;  Laterality: Right;   CYSTOSCOPY WITH URETEROSCOPY AND STENT PLACEMENT Right 03/26/2014   Procedure: CYSTO WITH RIGHT URETEROSCOPY/STENT PLACEMENT;  Surgeon: Festus Aloe, MD;  [facility];  Service: Urology;  Laterality: Right;   EXTRACORPOREAL SHOCK WAVE LITHOTRIPSY Right 12-17-2013   HOLMIUM LASER APPLICATION Right [id number]   Procedure: HOLMIUM LASER APPLICATION;  Surgeon: Festus Aloe, MD;  Location: [facility];  Service: Urology;  Laterality: Right;   INGUINAL HERNIA REPAIR Bilateral right 2003/   left 2004   STONE EXTRACTION WITH BASKET Right 03/26/2014   Procedure: STONE EXTRACTION WITH BASKET;  Surgeon: Festus Aloe, MD;  Location: [facility];  Service: Urology;  Laterality: Right;   TOTAL KNEE ARTHROPLASTY Left Q000111Q   UMBILICAL HERNIA REPAIR  2000    Family History  Problem Relation Age of Onset   Hypertension Mother    Cancer Father        prostate   Cancer Sister        breast   Arthritis Other        family hx   Hyperlipidemia Other  family hx   Hypertension Other        family hx    Social History   Socioeconomic History   Marital status: Married    Spouse name: Not on file   Number of children: 1   Years of education: Not on file   Highest education level: Not on file  Occupational History   Not on file  Tobacco Use   Smoking status: Former    Packs/day: 1.00    Years: 6.00    Pack years: 6.00    Types: Cigarettes    Quit date: 10/12/1970    Years since quitting: 50.5   Smokeless tobacco: Former    Types: Chew  Substance and Sexual Activity   Alcohol use: Yes     Alcohol/week: 7.0 standard drinks    Types: 7 Glasses of wine per week    Comment: never drinks more than one per day   Drug use: No   Sexual activity: Not on file  Other Topics Concern   Not on file  Social History Narrative   Right Handed    Lives in a two story home with a basement.   Has 1 daughter   Social Determinants of Radio broadcast assistant Strain: Not on file  Food Insecurity: Not on file  Transportation Needs: Not on file  Physical Activity: Not on file  Stress: Not on file  Social Connections: Not on file  Intimate Partner Violence: Not on file    Outpatient Medications Prior to Visit  Medication Sig Dispense Refill   acetaminophen (TYLENOL) 500 MG tablet Take 500 mg by mouth every 6 (six) hours as needed.     albuterol (VENTOLIN HFA) 108 (90 Base) MCG/ACT inhaler Inhale 2 puffs into the lungs every 6 (six) hours as needed for wheezing or shortness of breath. 1 each 0   atorvastatin (LIPITOR) 20 MG tablet Take 1 tablet (20 mg total) by mouth daily. 90 tablet 0   cetirizine (ZYRTEC) 10 MG tablet Take 10 mg by mouth daily.     Cholecalciferol (D3-50 PO) Take by mouth.     fluticasone (FLONASE) 50 MCG/ACT nasal spray Place into both nostrils daily.     losartan (COZAAR) 100 MG tablet Take 1 tablet (100 mg total) by mouth daily. 90 tablet 2   meloxicam (MOBIC) 15 MG tablet Take 1 tablet (15 mg total) by mouth daily. 90 tablet 1   Naproxen Sodium 220 MG CAPS Take by mouth.     tadalafil (CIALIS) 20 MG tablet tadalafil 20 mg tablet  Take 1 tablet every day by oral route.     tamsulosin (FLOMAX) 0.4 MG CAPS capsule Take 0.4 mg by mouth.     triamcinolone cream (KENALOG) 0.1 % Apply 1 application topically 2 (two) times daily. Apply to affected areas bid as needed. 453 g 0   vitamin B-12 (CYANOCOBALAMIN) 500 MCG tablet Take 2 tablets by mouth daily.     tadalafil (CIALIS) 20 MG tablet Take 1 tablet (20 mg total) by mouth daily as needed for erectile dysfunction. 6  tablet 5   No facility-administered medications prior to visit.    No Known Allergies  ROS Review of Systems  Constitutional:  Negative for chills and fever.  Respiratory:  Negative for shortness of breath.   Skin:  Positive for rash.  Hematological:  Negative for adenopathy.     Objective:    Physical Exam Vitals reviewed.  Constitutional:      Appearance: Normal appearance.  Cardiovascular:     Rate and Rhythm: Normal rate and regular rhythm.  Skin:    Comments: Only very subtle faint erythematous macular blanching rash in a couple areas of the back.  Nonscaly.  No pustules.  No vesicles.  Nontender.  Neurological:     Mental Status: He is alert.    BP 130/80 (BP Location: Left Arm, Patient Position: Sitting, Cuff Size: Normal)   Pulse 65   Temp 98 F (36.7 C) (Oral)   Wt 233 lb 1.6 oz (105.7 kg)   SpO2 97%   BMI 31.61 kg/m  Wt Readings from Last 3 Encounters:  04/03/21 233 lb 1.6 oz (105.7 kg)  03/24/21 229 lb (103.9 kg)  03/17/21 229 lb 4.8 oz (104 kg)     Health Maintenance Due  Topic Date Due   INFLUENZA VACCINE  02/16/2021    There are no preventive care reminders to display for this patient.  Lab Results  Component Value Date   TSH 2.14 06/20/2020   Lab Results  Component Value Date   WBC 9.6 11/22/2013   HGB 13.6 03/05/2014   HCT 40.0 03/05/2014   MCV 94.6 11/22/2013   PLT 234 11/22/2013   Lab Results  Component Value Date   NA 139 06/20/2020   K 4.6 06/20/2020   CO2 26 06/20/2020   GLUCOSE 92 06/20/2020   BUN 16 06/20/2020   CREATININE 0.87 06/20/2020   BILITOT 0.8 12/05/2019   ALKPHOS 64 12/05/2019   AST 21 12/05/2019   ALT 18 12/05/2019   PROT 6.4 06/20/2020   ALBUMIN 4.3 12/05/2019   CALCIUM 9.1 06/20/2020   GFR 74.60 12/05/2019   Lab Results  Component Value Date   CHOL 162 12/05/2019   Lab Results  Component Value Date   HDL 49.90 12/05/2019   Lab Results  Component Value Date   LDLCALC 98 12/05/2019   Lab  Results  Component Value Date   TRIG 67.0 12/05/2019   Lab Results  Component Value Date   CHOLHDL 3 12/05/2019   No results found for: HGBA1C    Assessment & Plan:   Problem List Items Addressed This Visit   None Visit Diagnoses     Pruritus    -  Primary     Patient now has had several weeks of intermittent pruritus with minimal if any rash visible at times.  Seems to be confined to the trunk.  No evidence clinically to suggest scabies.  Question is whether he is having some sort of irritation from cling free sheets that are used in his dryer  -Avoid anticholinergics -We recommend being more proactive with using triamcinolone twice daily for a week to 10 days.  If still having breakthrough symptoms with that consider dermatology referral. -Avoid prolonged exposure to heat which could exacerbate  No orders of the defined types were placed in this encounter.   Follow-up: No follow-ups on file.    Carolann Littler, MD

## 2021-04-10 ENCOUNTER — Other Ambulatory Visit: Payer: Self-pay | Admitting: Family Medicine

## 2021-04-21 DIAGNOSIS — L298 Other pruritus: Secondary | ICD-10-CM | POA: Diagnosis not present

## 2021-05-13 ENCOUNTER — Encounter: Payer: Self-pay | Admitting: Family Medicine

## 2021-05-18 ENCOUNTER — Ambulatory Visit (INDEPENDENT_AMBULATORY_CARE_PROVIDER_SITE_OTHER): Payer: Medicare Other | Admitting: Family Medicine

## 2021-05-18 ENCOUNTER — Other Ambulatory Visit: Payer: Self-pay

## 2021-05-18 VITALS — BP 148/88 | HR 65 | Temp 98.0°F | Wt 232.6 lb

## 2021-05-18 DIAGNOSIS — R42 Dizziness and giddiness: Secondary | ICD-10-CM | POA: Diagnosis not present

## 2021-05-18 DIAGNOSIS — I10 Essential (primary) hypertension: Secondary | ICD-10-CM | POA: Diagnosis not present

## 2021-05-18 DIAGNOSIS — L299 Pruritus, unspecified: Secondary | ICD-10-CM | POA: Diagnosis not present

## 2021-05-18 MED ORDER — LOSARTAN POTASSIUM 100 MG PO TABS: 100.0000 mg | ORAL_TABLET | Freq: Every day | ORAL | 3 refills | Status: DC

## 2021-05-18 MED ORDER — AMLODIPINE BESYLATE 5 MG PO TABS: 5.0000 mg | ORAL_TABLET | Freq: Every day | ORAL | 3 refills | Status: DC

## 2021-05-18 NOTE — Progress Notes (Signed)
Established Patient Office Visit  Subjective:  Patient ID: Lee Smith, male    DOB: 08-17-1945  Age: 75 y.o. MRN: [id number]  CC:  Chief Complaint  Patient presents with   Follow-up    Hypertension, has BP readings from home     HPI TRENTEN WATCHMAN presents for exacerbation of chronic hypertension.  Who is currently on losartan 100 mg daily.  He has gained a bit of weight recently.  Still stays active with walking couple miles most days and very active with yard work.  He is apprised of the weight gain as he generally skips breakfast eats a relatively light lunch and then a healthy dinner.  Only about 1 glass of wine every 5 days.  Brings in readings from the past week.  Systolic readings have been fairly consistently 140s to 160.  Diastolics consistently 49F and 80s.  No recent headaches.  Previously took amlodipine in combination with losartan with good success.  He lost some weight couple years ago and blood pressure improved and we dropped the amlodipine at that time  Has had a couple episodes of acute dizziness with walking.  This sounded more to vestibular and episodes only lasted about 30 seconds.  He had to hold onto steady himself and walking.  Did not have any speech change, focal weakness, ataxia, or any other acute symptoms.  Continues to have generalized pruritus.  No significant rash.  No new soaps or detergents.  Saw a dermatologist.  Was instructed to take Zyrtec 3 times daily but he is reluctant to do so.  Continues to take this once daily.  He is not aware of any food allergies.  Itching usually relieved when he is in the shower or after sweating.  No urticaria.  Past Medical History:  Diagnosis Date   Allergic rhinitis    Arthritis    bilateral knees; has planned partial knee replacement next month   Genital warts    History of kidney stones 11/2013   lithotripsy for right ureteral stone   Hx of colonic polyps    2008   Hyperlipidemia    Hypertension     Right nephrolithiasis     Past Surgical History:  Procedure Laterality Date   CHOLECYSTECTOMY  2002   CYSTOSCOPY W/ URETERAL STENT PLACEMENT Right 02/12/2014   Procedure: CYSTOSCOPY WITH RETROGRADE PYELOGRAM/URETERAL STENT PLACEMENT;  Surgeon: Festus Aloe, MD;  Location: [facility];  Service: Urology;  Laterality: Right;   CYSTOSCOPY WITH RETROGRADE PYELOGRAM, URETEROSCOPY AND STENT PLACEMENT Right 02/12/2014   Procedure: RIGHT URETEROSCOPY ;  Surgeon: Festus Aloe, MD;  Location: [facility];  Service: Urology;  Laterality: Right;   CYSTOSCOPY WITH URETEROSCOPY AND STENT PLACEMENT Right 03/05/2014   Procedure: CYSTOSCOPY WITH URETEROSCOPY AND STENT EXCHANGE, STONE EXTRACTION;  Surgeon: Festus Aloe, MD;  Location: [facility];  Service: Urology;  Laterality: Right;   CYSTOSCOPY WITH URETEROSCOPY AND STENT PLACEMENT Right 03/26/2014   Procedure: CYSTO WITH RIGHT URETEROSCOPY/STENT PLACEMENT;  Surgeon: Festus Aloe, MD;  Location: [facility];  Service: Urology;  Laterality: Right;   EXTRACORPOREAL SHOCK WAVE LITHOTRIPSY Right 12-17-2013   HOLMIUM LASER APPLICATION Right 0/26/3785   Procedure: HOLMIUM LASER APPLICATION;  Surgeon: Festus Aloe, MD;  Location: [facility];  Service: Urology;  Laterality: Right;   INGUINAL HERNIA REPAIR Bilateral right 2003/   left 2004   STONE EXTRACTION WITH BASKET Right 03/26/2014   Procedure: STONE EXTRACTION WITH BASKET;  Surgeon: Festus Aloe,  MD;  Location: [place];  Service: Urology;  Laterality: Right;   TOTAL KNEE ARTHROPLASTY Left 50/93/2671   UMBILICAL HERNIA REPAIR  2000    Family History  Problem Relation Age of Onset   Hypertension Mother    Cancer Father        prostate   Cancer Sister        breast   Arthritis Other        family hx   Hyperlipidemia Other        family hx   Hypertension Other        family hx    Social  History   Socioeconomic History   Marital status: Married    Spouse name: Not on file   Number of children: 1   Years of education: Not on file   Highest education level: Not on file  Occupational History   Not on file  Tobacco Use   Smoking status: Former    Packs/day: 1.00    Years: 6.00    Pack years: 6.00    Types: Cigarettes    Quit date: 10/12/1970    Years since quitting: 50.6   Smokeless tobacco: Former    Types: Chew  Substance and Sexual Activity   Alcohol use: Yes    Alcohol/week: 7.0 standard drinks    Types: 7 Glasses of wine per week    Comment: never drinks more than one per day   Drug use: No   Sexual activity: Not on file  Other Topics Concern   Not on file  Social History Narrative   Right Handed    Lives in a two story home with a basement.   Has 1 daughter   Social Determinants of Radio broadcast assistant Strain: Unknown   Difficulty of Paying Living Expenses: Patient refused  Food Insecurity: Unknown   Worried About Charity fundraiser in the Last Year: Patient refused   Arboriculturist in the Last Year: Patient refused  Transportation Needs: Unknown   Film/video editor (Medical): Patient refused   Film/video editor (Non-Medical): Patient refused  Physical Activity: Sufficiently Active   Days of Exercise per Week: 7 days   Minutes of Exercise per Session: 60 min  Stress: No Stress Concern Present   Feeling of Stress : Not at all  Social Connections: Socially Integrated   Frequency of Communication with Friends and Family: More than three times a week   Frequency of Social Gatherings with Friends and Family: Once a week   Attends Religious Services: More than 4 times per year   Active Member of [employer] or Organizations: Yes   Attends Music therapist: More than 4 times per year   Marital Status: Married  Human resources officer Violence: Not on file    Outpatient Medications Prior to Visit  Medication Sig Dispense Refill    acetaminophen (TYLENOL) 500 MG tablet Take 500 mg by mouth every 6 (six) hours as needed.     albuterol (VENTOLIN HFA) 108 (90 Base) MCG/ACT inhaler Inhale 2 puffs into the lungs every 6 (six) hours as needed for wheezing or shortness of breath. 1 each 0   atorvastatin (LIPITOR) 20 MG tablet Take 1 tablet (20 mg total) by mouth daily. *labs required for any future refills* 30 tablet 0   cetirizine (ZYRTEC) 10 MG tablet Take 10 mg by mouth daily.     Cholecalciferol (D3-50 PO) Take by mouth.     fluticasone (  FLONASE) 50 MCG/ACT nasal spray Place into both nostrils daily.     meloxicam (MOBIC) 15 MG tablet Take 1 tablet (15 mg total) by mouth daily. 90 tablet 1   Naproxen Sodium 220 MG CAPS Take by mouth.     tadalafil (CIALIS) 20 MG tablet tadalafil 20 mg tablet  Take 1 tablet every day by oral route.     tamsulosin (FLOMAX) 0.4 MG CAPS capsule Take 0.4 mg by mouth.     triamcinolone cream (KENALOG) 0.1 % Apply 1 application topically 2 (two) times daily. Apply to affected areas bid as needed. 453 g 0   vitamin B-12 (CYANOCOBALAMIN) 500 MCG tablet Take 2 tablets by mouth daily.     losartan (COZAAR) 100 MG tablet Take 1 tablet (100 mg total) by mouth daily. 90 tablet 2   No facility-administered medications prior to visit.    No Known Allergies  ROS Review of Systems  Constitutional:  Negative for fatigue and unexpected weight change.  Eyes:  Negative for visual disturbance.  Respiratory:  Negative for cough, chest tightness and shortness of breath.   Cardiovascular:  Negative for chest pain, palpitations and leg swelling.  Neurological:  Negative for syncope, weakness, light-headedness and headaches.     Objective:    Physical Exam Vitals reviewed.  Constitutional:      Appearance: Normal appearance.  Cardiovascular:     Rate and Rhythm: Normal rate and regular rhythm.  Pulmonary:     Effort: Pulmonary effort is normal.     Breath sounds: Normal breath sounds.   Musculoskeletal:     Cervical back: Neck supple.     Right lower leg: No edema.     Left lower leg: No edema.  Neurological:     General: No focal deficit present.     Mental Status: He is alert.    BP 138/76 (BP Location: Left Arm, Patient Position: Sitting, Cuff Size: Normal)   Pulse 65   Temp 98 F (36.7 C) (Oral)   Wt 232 lb 9.6 oz (105.5 kg)   SpO2 98%   BMI 31.55 kg/m  Wt Readings from Last 3 Encounters:  05/18/21 232 lb 9.6 oz (105.5 kg)  04/03/21 233 lb 1.6 oz (105.7 kg)  03/24/21 229 lb (103.9 kg)     Health Maintenance Due  Topic Date Due   COVID-19 Vaccine (5 - Booster for Pfizer series) 04/09/2021    There are no preventive care reminders to display for this patient.  Lab Results  Component Value Date   TSH 2.14 06/20/2020   Lab Results  Component Value Date   WBC 9.6 11/22/2013   HGB 13.6 03/05/2014   HCT 40.0 03/05/2014   MCV 94.6 11/22/2013   PLT 234 11/22/2013   Lab Results  Component Value Date   NA 139 06/20/2020   K 4.6 06/20/2020   CO2 26 06/20/2020   GLUCOSE 92 06/20/2020   BUN 16 06/20/2020   CREATININE 0.87 06/20/2020   BILITOT 0.8 12/05/2019   ALKPHOS 64 12/05/2019   AST 21 12/05/2019   ALT 18 12/05/2019   PROT 6.4 06/20/2020   ALBUMIN 4.3 12/05/2019   CALCIUM 9.1 06/20/2020   GFR 74.60 12/05/2019   Lab Results  Component Value Date   CHOL 162 12/05/2019   Lab Results  Component Value Date   HDL 49.90 12/05/2019   Lab Results  Component Value Date   LDLCALC 98 12/05/2019   Lab Results  Component Value Date   TRIG 67.0 12/05/2019  Lab Results  Component Value Date   CHOLHDL 3 12/05/2019   No results found for: HGBA1C    Assessment & Plan:   #1 hypertension poorly controlled by several home readings.  Also, repeat reading in office today by me left arm seated 148/88  -Add back amlodipine 5 mg daily.  He has follow-up appointment in 2 weeks and reassess at that time. -Continue regular aerobic exercise such  as walking  #2 recent transient dizziness.  This sounded by description more likely vestibular.  Symptoms are very transient lasting only about 30 seconds.  Be in touch for any recurrent symptoms  #3 generalized pruritus.  No visible rash.  Unimproved recently with antihistamines.  If symptoms persist consider referral to allergist for further testing   Meds ordered this encounter  Medications   losartan (COZAAR) 100 MG tablet    Sig: Take 1 tablet (100 mg total) by mouth daily.    Dispense:  90 tablet    Refill:  3   amLODipine (NORVASC) 5 MG tablet    Sig: Take 1 tablet (5 mg total) by mouth daily.    Dispense:  90 tablet    Refill:  3    Follow-up: No follow-ups on file.    Carolann Littler, MD

## 2021-05-20 ENCOUNTER — Encounter: Payer: Self-pay | Admitting: Family Medicine

## 2021-05-20 DIAGNOSIS — L299 Pruritus, unspecified: Secondary | ICD-10-CM

## 2021-05-20 NOTE — Telephone Encounter (Signed)
Please advise 

## 2021-05-22 ENCOUNTER — Encounter: Payer: Self-pay | Admitting: Family Medicine

## 2021-05-22 ENCOUNTER — Other Ambulatory Visit: Payer: Self-pay | Admitting: Family Medicine

## 2021-05-22 MED ORDER — ATORVASTATIN CALCIUM 20 MG PO TABS
20.0000 mg | ORAL_TABLET | Freq: Every day | ORAL | 1 refills | Status: DC
Start: 2021-05-22 — End: 2021-05-22

## 2021-05-22 MED ORDER — ATORVASTATIN CALCIUM 20 MG PO TABS: 20.0000 mg | ORAL_TABLET | Freq: Every day | ORAL | 3 refills | Status: DC

## 2021-06-01 DIAGNOSIS — L570 Actinic keratosis: Secondary | ICD-10-CM | POA: Diagnosis not present

## 2021-06-01 DIAGNOSIS — L239 Allergic contact dermatitis, unspecified cause: Secondary | ICD-10-CM | POA: Diagnosis not present

## 2021-06-01 DIAGNOSIS — L814 Other melanin hyperpigmentation: Secondary | ICD-10-CM | POA: Diagnosis not present

## 2021-06-01 DIAGNOSIS — D225 Melanocytic nevi of trunk: Secondary | ICD-10-CM | POA: Diagnosis not present

## 2021-06-01 DIAGNOSIS — L298 Other pruritus: Secondary | ICD-10-CM | POA: Diagnosis not present

## 2021-06-01 DIAGNOSIS — L821 Other seborrheic keratosis: Secondary | ICD-10-CM | POA: Diagnosis not present

## 2021-06-04 DIAGNOSIS — L298 Other pruritus: Secondary | ICD-10-CM | POA: Diagnosis not present

## 2021-06-04 DIAGNOSIS — L309 Dermatitis, unspecified: Secondary | ICD-10-CM | POA: Diagnosis not present

## 2021-06-16 ENCOUNTER — Encounter: Payer: Self-pay | Admitting: Family Medicine

## 2021-06-16 DIAGNOSIS — L299 Pruritus, unspecified: Secondary | ICD-10-CM

## 2021-06-16 NOTE — Telephone Encounter (Signed)
I definitely agree with getting some follow-up labs, although generally I would say that blood work up usually has a fairly low yield for generalized pruritus without rash.  I did review the labs he had through the [place] last summer including liver functions, A1c, thyroid, etc. and these were all basically normal.  That being said, I still agree with getting some follow-up labs including CBC, CMP, TSH, A1c, hepatitis C, hepatitis B.  Would also suggest chest x-ray to rule out any lymphadenopathy.  There is no single blood test per se to screen for lymphoma but would be helpful to get a chest x-ray to make sure no evidence for adenopathy  I am placing order for multiple labs that include chest x-ray as above and he needs to schedule those labs at his convenience

## 2021-06-18 DIAGNOSIS — H7201 Central perforation of tympanic membrane, right ear: Secondary | ICD-10-CM | POA: Diagnosis not present

## 2021-06-18 DIAGNOSIS — Z974 Presence of external hearing-aid: Secondary | ICD-10-CM | POA: Diagnosis not present

## 2021-06-18 DIAGNOSIS — H9313 Tinnitus, bilateral: Secondary | ICD-10-CM | POA: Diagnosis not present

## 2021-06-18 DIAGNOSIS — H903 Sensorineural hearing loss, bilateral: Secondary | ICD-10-CM | POA: Diagnosis not present

## 2021-06-19 ENCOUNTER — Other Ambulatory Visit (INDEPENDENT_AMBULATORY_CARE_PROVIDER_SITE_OTHER): Payer: Medicare Other

## 2021-06-19 ENCOUNTER — Ambulatory Visit (INDEPENDENT_AMBULATORY_CARE_PROVIDER_SITE_OTHER): Payer: Medicare Other

## 2021-06-19 ENCOUNTER — Other Ambulatory Visit: Payer: Self-pay

## 2021-06-19 DIAGNOSIS — L299 Pruritus, unspecified: Secondary | ICD-10-CM

## 2021-06-19 DIAGNOSIS — I700 Atherosclerosis of aorta: Secondary | ICD-10-CM | POA: Diagnosis not present

## 2021-06-19 LAB — COMPREHENSIVE METABOLIC PANEL
ALT: 18 U/L (ref 0–53)
AST: 19 U/L (ref 0–37)
Albumin: 4.3 g/dL (ref 3.5–5.2)
Alkaline Phosphatase: 62 U/L (ref 39–117)
BUN: 20 mg/dL (ref 6–23)
CO2: 28 mEq/L (ref 19–32)
Calcium: 9.2 mg/dL (ref 8.4–10.5)
Chloride: 104 mEq/L (ref 96–112)
Creatinine, Ser: 0.92 mg/dL (ref 0.40–1.50)
GFR: 80.89 mL/min (ref >60.00)
Glucose, Bld: 92 mg/dL (ref 70–99)
Potassium: 4.1 mEq/L (ref 3.5–5.1)
Sodium: 138 mEq/L (ref 135–145)
Total Bilirubin: 0.7 mg/dL (ref 0.2–1.2)
Total Protein: 6.8 g/dL (ref 6.0–8.3)

## 2021-06-19 LAB — CBC WITH DIFFERENTIAL/PLATELET
Basophils Absolute: 0 10*3/uL (ref 0.0–0.1)
Basophils Relative: 0.8 % (ref 0.0–3.0)
Eosinophils Absolute: 0.2 10*3/uL (ref 0.0–0.7)
Eosinophils Relative: 3.8 % (ref 0.0–5.0)
HCT: 43.6 % (ref 39.0–52.0)
Hemoglobin: 14.5 g/dL (ref 13.0–17.0)
Lymphocytes Relative: 27.2 % (ref 12.0–46.0)
Lymphs Abs: 1.1 10*3/uL (ref 0.7–4.0)
MCHC: 33.3 g/dL (ref 30.0–36.0)
MCV: 97.4 fl (ref 78.0–100.0)
Monocytes Absolute: 0.4 10*3/uL (ref 0.1–1.0)
Monocytes Relative: 8.6 % (ref 3.0–12.0)
Neutro Abs: 2.5 10*3/uL (ref 1.4–7.7)
Neutrophils Relative %: 59.6 % (ref 43.0–77.0)
Platelets: 189 10*3/uL (ref 150.0–400.0)
RBC: 4.48 Mil/uL (ref 4.22–5.81)
RDW: 13.3 % (ref 11.5–15.5)
WBC: 4.1 10*3/uL (ref 4.0–10.5)

## 2021-06-19 LAB — TSH: TSH: 3.12 u[IU]/mL (ref 0.35–5.50)

## 2021-06-19 LAB — SEDIMENTATION RATE: Sed Rate: 7 mm/hr (ref 0–20)

## 2021-06-22 DIAGNOSIS — R350 Frequency of micturition: Secondary | ICD-10-CM | POA: Diagnosis not present

## 2021-06-22 DIAGNOSIS — N5201 Erectile dysfunction due to arterial insufficiency: Secondary | ICD-10-CM | POA: Diagnosis not present

## 2021-06-22 DIAGNOSIS — N402 Nodular prostate without lower urinary tract symptoms: Secondary | ICD-10-CM | POA: Diagnosis not present

## 2021-06-22 DIAGNOSIS — N401 Enlarged prostate with lower urinary tract symptoms: Secondary | ICD-10-CM | POA: Diagnosis not present

## 2021-06-23 LAB — HEPATITIS C ANTIBODY
Hepatitis C Ab: NONREACTIVE
SIGNAL TO CUT-OFF: 0.05 (ref <1.00)

## 2021-06-23 LAB — HEPATITIS B SURFACE ANTIGEN: Hepatitis B Surface Ag: NONREACTIVE

## 2021-06-24 DIAGNOSIS — Z23 Encounter for immunization: Secondary | ICD-10-CM | POA: Diagnosis not present

## 2021-06-25 ENCOUNTER — Ambulatory Visit (INDEPENDENT_AMBULATORY_CARE_PROVIDER_SITE_OTHER): Payer: Medicare Other | Admitting: Allergy

## 2021-06-25 ENCOUNTER — Encounter: Payer: Self-pay | Admitting: Allergy

## 2021-06-25 ENCOUNTER — Other Ambulatory Visit: Payer: Self-pay

## 2021-06-25 VITALS — BP 124/70 | HR 80 | Temp 98.0°F | Resp 18 | Ht 72.0 in | Wt 232.4 lb

## 2021-06-25 DIAGNOSIS — L299 Pruritus, unspecified: Secondary | ICD-10-CM

## 2021-06-25 DIAGNOSIS — J310 Chronic rhinitis: Secondary | ICD-10-CM

## 2021-06-25 DIAGNOSIS — R062 Wheezing: Secondary | ICD-10-CM

## 2021-06-25 MED ORDER — HYDROXYZINE HCL 25 MG PO TABS: ORAL_TABLET | ORAL | 2 refills | Status: DC

## 2021-06-25 NOTE — Assessment & Plan Note (Signed)
Pruritus started in the spring 2022 and getting worse.  No specific triggers noted.  Saw dermatology and had patch testing which was negative.  CBC differential, CMP, TSH, CXR normal.  Tried triamcinolone and Zyrtec with some benefit.  Prednisone ineffective. Tried to stop some of his medications with no improvement. Rarely has a rash with this. Not moisturizing daily. Concerned about allergic triggers.  Today's skin prick testing showed: Negative to indoor/outdoor allergens and common foods.  Discussed with patient that the number one cause for pruritus in his age group is xerosis.  . See below for proper skin care - stressed importance of daily moisturization and using fragrance free and dye free products.  . Stop zyrtec. . Take hydroxyzine 25mg  1/2 tablet to 1 tablet every 8 hours as needed for itching.  o If you notice worsening urine flow then stop and let us know - has BPH. o If it makes you drowsy let us know.  . Get bloodwork to rule out other etiologies.

## 2021-06-25 NOTE — Assessment & Plan Note (Signed)
Rare episodes of wheezing from the "throat" and uses albuterol prn with good benefit. . May use albuterol rescue inhaler 2 puffs every 4 to 6 hours as needed for shortness of breath, chest tightness, coughing, and wheezing. Monitor frequency of use.

## 2021-06-25 NOTE — Progress Notes (Signed)
New Patient Note  RE: Lee Smith MRN: [id number] DOB: 1945-07-27 Date of Office Visit: 06/25/2021  Consult requested by: Lee Post, MD Primary care provider: [name], MD  Chief Complaint: Pruritus (Started last spring and has gotten worse over the summer. Itching all over the body - was put on zyrtec and sent to dermatology they tested for different chemicals all were negative. He was also given a steroid shot and that did not help much.  ), Allergic Rhinitis  (Constant head congestion ), Other (Last spring in march had hearing loss - 10% in the right ear. He has to wear hearing aids in both ears. - They did a lot of test and he came back healthy on both blood work and chest x ray. ), and Rash (Under arms, on chest , and lower back appears with itching )  History of Present Illness: I had the pleasure of seeing Lee Smith for initial evaluation at the [facility] on 06/25/2021. He is a 75 y.o. male, who is referred here by Lee Post, MD for the evaluation of pruritus.  Itching:  Itching started about spring 2022 and it seems to be getting worse. Mainly occurs on his torso. Describes them as itchy and does not usually have a rash but sometimes has a slight pink hue. Associated symptoms include: none.  Frequency of episodes: daily and throughout the day. Suspected triggers are none. Denies any fevers, chills, changes in medications, foods, personal care products or recent infections. He tried to stop his vitamin supplements, Flomax with no improvement in his symptoms.  He has tried the following therapies: triamcinolone, zyrtec 2m with some benefit. Systemic steroids yes with no benefit. Currently on zyrtec 187m20mg daily.  Previous work up includes: 06/19/2021 CBC diff, CMP, ESR normal. Patient saw dermatology and patch test was negative per patient report.  Previous history of rash/hives: none. Patient is up to date with the  following cancer screening tests: physical exam, colonoscopy - had polyps in the past. Currently using moisturizing body wash and sometimes moisturizes but not daily.  Rhinitis: He reports symptoms of nasal congestion, PND, sneezing fits. Symptoms have been going on for 3-5 years. The symptoms are present  all year around with worsening in spring and summer. Anosmia: no. Headache: no. He has used zyrtec, Flonase with fair improvement in symptoms. Sinus infections: no. Previous work up includes: none. Previous ENT evaluation: yes for hearing issues. History of reflux: denies.  Patient going to [place] April. He thinks the itching is less while he is down there vs in [place].  06/19/2021 CXR: "IMPRESSION: 1.  No radiographic evidence of acute cardiopulmonary disease. 2. Aortic atherosclerosis."  Assessment and Plan: [name] a [age].o. male with: Pruritus Pruritus started in the spring 2022 and getting worse.  No specific triggers noted.  Saw dermatology and had patch testing which was negative.  CBC differential, CMP, TSH, CXR normal.  Tried triamcinolone and Zyrtec with some benefit.  Prednisone ineffective. Tried to stop some of his medications with no improvement. Rarely has a rash with this. Not moisturizing daily. Concerned about allergic triggers. Today's skin prick testing showed: Negative to indoor/outdoor allergens and common foods. Discussed with patient that the number one cause for pruritus in his age group is xerosis.  See below for proper skin care - stressed importance of daily moisturization and using fragrance free and dye free products.  Stop zyrtec. Take hydroxyzine 2558m/2 tablet to  1 tablet every 8 hours as needed for itching.  If you notice worsening urine flow then stop and let us know - has BPH. If it makes you drowsy let us know.  Get bloodwork to rule out other etiologies.   Chronic rhinitis Perennial rhinitis symptoms with worsening in the spring and summer.   Tried Zyrtec and Flonase with some benefit.  No prior evaluation.  Denies reflux. Today's skin prick testing showed: Negative to indoor/outdoor allergens. Use Flonase (fluticasone) OR Nasacort (triamcinolone) nasal spray 1 spray per nostril twice a day as needed for nasal congestion.  Nasal saline spray (i.e., Simply Saline) or nasal saline lavage (i.e., NeilMed) is recommended as needed and prior to medicated nasal sprays.  Wheezing Rare episodes of wheezing from the "throat" and uses albuterol prn with good benefit. May use albuterol rescue inhaler 2 puffs every 4 to 6 hours as needed for shortness of breath, chest tightness, coughing, and wheezing. Monitor frequency of use.   Return in about 4 months (around 10/24/2021).  Meds ordered this encounter  Medications   hydrOXYzine (ATARAX) 25 MG tablet    Sig: Take 1/2 tablet to 1 tablet every 8 hours as needed for itching.    Dispense:  90 tablet    Refill:  2    Lab Orders         ANA w/Reflex         Alpha-Gal Panel         Tryptase      Other allergy screening: Asthma:  Sometimes gets wheezy in the throat and uses albuterol 3-4 times per year with good benefit. Food allergy: no Medication allergy: no Hymenoptera allergy: no Urticaria: no Eczema:no History of recurrent infections suggestive of immunodeficency: no  Diagnostics: Skin Testing: Environmental allergy panel and select foods. Negative to indoor/outdoor allergens and common foods. Results discussed with patient/family.  Airborne Adult Perc - 06/25/21 0855     Time Antigen Placed 0900    Allergen Manufacturer Lavella Hammock    Location Back    Number of Test 69    1. Control-Buffer 50% Glycerol Negative    2. Control-Histamine 1 mg/ml 2+    3. Albumin saline Negative    4. Marrero Negative    5. [place] Negative    6. Johnson Negative    7. [place] Blue Negative    8. Meadow Fescue Negative    9. Perennial Rye Negative    10. Sweet Vernal Negative    11. Timothy  Negative    12. Cocklebur Negative    13. Burweed Marshelder Negative    14. Ragweed, short Negative    15. Ragweed, Giant Negative    16. Plantain,  English Negative    17. Lamb's Quarters Negative    18. Sheep Sorrell Negative    19. Rough Pigweed Negative    20. Marsh Elder, Rough Negative    21. Mugwort, Common Negative    22. Ash mix Negative    23. Birch mix Negative    24. Beech American Negative    25. Box, Elder Negative    26. Cedar, red Negative    27. Cottonwood, [place] Negative    28. Elm mix Negative    29. Hickory Negative    30. Maple mix Negative    31. Oak, [place] mix Negative    32. Pecan Pollen Negative    33. Pine mix Negative    34. Sycamore Eastern Negative    35. Walnut, Black Pollen Negative  36. Alternaria alternata Negative    37. Cladosporium Herbarum Negative    38. Aspergillus mix Negative    39. Penicillium mix Negative    40. Bipolaris sorokiniana (Helminthosporium) Negative    41. Drechslera spicifera (Curvularia) Negative    42. Mucor plumbeus Negative    43. Fusarium moniliforme Negative    44. Aureobasidium pullulans (pullulara) Negative    45. Rhizopus oryzae Negative    46. Botrytis cinera Negative    47. Epicoccum nigrum Negative    48. Phoma betae Negative    49. Candida Albicans Negative    50. Trichophyton mentagrophytes Negative    51. Mite, D Farinae  5,000 AU/ml Negative    52. Mite, D Pteronyssinus  5,000 AU/ml Negative    53. Cat Hair 10,000 BAU/ml Negative    54.  Dog Epithelia Negative    55. Mixed Feathers Negative    56. Horse Epithelia Negative    57. Cockroach, German Negative    58. Mouse Negative    59. Tobacco Leaf Negative             Food Perc - 06/25/21 0856       Test Information   Time Antigen Placed 0900    Allergen Manufacturer Lavella Hammock    Location Back    Number of allergen test 10      Food   1. Peanut Negative    2. Soybean food Negative    3. Wheat, whole Negative    4. Sesame  Negative    5. Milk, cow Negative    6. Egg White, chicken Negative    7. Casein Negative    8. Shellfish mix Negative    9. Fish mix Negative    10. 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   UNSPECIFIED HEARING LOSS 11/01/2008   Essential hypertension 11/01/2008   ROTATOR CUFF SYNDROME, 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   Past Medical History:  Diagnosis Date   Allergic rhinitis    Arthritis    bilateral knees; has planned partial knee replacement next month   Genital warts    History of kidney stones 11/2013   lithotripsy for right ureteral stone   Hx of colonic polyps    2008   Hyperlipidemia    Hypertension    Right nephrolithiasis    Past Surgical History: Past Surgical History:  Procedure Laterality Date   CHOLECYSTECTOMY  2002   CYSTOSCOPY W/ URETERAL STENT PLACEMENT Right 02/12/2014   Procedure: CYSTOSCOPY WITH RETROGRADE PYELOGRAM/URETERAL STENT PLACEMENT;  Surgeon: Festus Aloe, MD;  Location: [facility];  Service: Urology;  Laterality: Right;   CYSTOSCOPY WITH RETROGRADE PYELOGRAM, URETEROSCOPY AND STENT PLACEMENT Right 02/12/2014   Procedure: RIGHT URETEROSCOPY ;  Surgeon: Festus Aloe, MD;  Location: [facility];  Service: Urology;  Laterality: Right;   CYSTOSCOPY WITH URETEROSCOPY AND STENT PLACEMENT Right 03/05/2014   Procedure: CYSTOSCOPY WITH URETEROSCOPY AND STENT EXCHANGE, STONE EXTRACTION;  Surgeon: Festus Aloe, MD;  [facility];  Service: Urology;  Laterality: Right;   CYSTOSCOPY WITH URETEROSCOPY AND STENT PLACEMENT Right 03/26/2014   Procedure: CYSTO WITH RIGHT URETEROSCOPY/STENT PLACEMENT;  Surgeon: Festus Aloe, MD;  Location: [facility];  Service: Urology;  Laterality: Right;   EXTRACORPOREAL SHOCK WAVE LITHOTRIPSY Right 12-17-2013   HOLMIUM LASER APPLICATION Right 08/06/1476   Procedure: HOLMIUM LASER APPLICATION;  Surgeon: Festus Aloe, MD;  Location: [facility];  Service: Urology;  Laterality: Right;   INGUINAL HERNIA REPAIR Bilateral right 2003/   left 2004   STONE EXTRACTION WITH BASKET Right 03/26/2014   Procedure: STONE EXTRACTION WITH BASKET;  Surgeon: Festus Aloe, MD;  Location: [facility];  Service: Urology;  Laterality: Right;   TOTAL KNEE ARTHROPLASTY Left 29/56/2130   UMBILICAL HERNIA REPAIR  2000   Medication List:  Current Outpatient Medications  Medication Sig Dispense Refill   acetaminophen (TYLENOL) 500 MG tablet Take 500 mg by mouth every 6 (six) hours as needed.     albuterol (VENTOLIN HFA) 108 (90 Base) MCG/ACT inhaler Inhale 2 puffs into the lungs every 6 (six) hours as needed for wheezing or shortness of breath. 1 each 0   amLODipine (NORVASC) 5 MG tablet Take 1 tablet (5 mg total) by mouth daily. 90 tablet 3   atorvastatin (LIPITOR) 20 MG tablet Take 1 tablet (20 mg total) by mouth daily. *labs required for any future refills* 90 tablet 3   Cholecalciferol (D3-50 PO) Take by mouth.     fluticasone (FLONASE) 50 MCG/ACT nasal spray Place into both nostrils daily.     hydrOXYzine (ATARAX) 25 MG tablet Take 1/2 tablet to 1 tablet every 8 hours as needed for itching. 90 tablet 2   losartan (COZAAR) 100 MG tablet Take 1 tablet (100 mg total) by mouth daily. 90 tablet 3   meloxicam (MOBIC) 15 MG tablet Take 1 tablet (15 mg total) by mouth daily. 90 tablet 1   Naproxen Sodium 220 MG CAPS  Take by mouth.     tadalafil (CIALIS) 20 MG tablet tadalafil 20 mg tablet  Take 1 tablet every day by oral route.     tamsulosin (FLOMAX) 0.4 MG CAPS capsule Take 0.4 mg by mouth.     triamcinolone cream (KENALOG) 0.1 % Apply 1 application topically 2 (two) times daily. Apply to affected areas bid as needed. 453 g 0   vitamin B-12 (CYANOCOBALAMIN) 500 MCG tablet Take 2 tablets by mouth daily.     No current facility-administered medications for this visit.   Allergies: No Known Allergies Social History: Social History   Socioeconomic History   Marital status: Married    Spouse name: Not on file   Number of children: 1   Years of education: Not on file   Highest education level: Not on file  Occupational History   Not on file  Tobacco Use   Smoking status: Former    Packs/day: 1.00    Years: 6.00    Pack years: 6.00    Types: Cigarettes    Quit date: 10/12/1970    Years since quitting: 50.7   Smokeless tobacco: Former    Types: Chew  Substance and Sexual Activity   Alcohol use: Yes    Alcohol/week: 7.0 standard drinks    Types: 7 Glasses of wine per week    Comment: never drinks more than one per day   Drug use: No   Sexual activity: Not on  file  Other Topics Concern   Not on file  Social History Narrative   Right Handed    Lives in a two story home with a basement.   Has 1 daughter   Social Determinants of Radio broadcast assistant Strain: Unknown   Difficulty of Paying Living Expenses: Patient refused  Food Insecurity: Unknown   Worried About Charity fundraiser in the Last Year: Patient refused   Arboriculturist in the Last Year: Patient refused  Transportation Needs: Unknown   Film/video editor (Medical): Patient refused   Film/video editor (Non-Medical): Patient refused  Physical Activity: Sufficiently Active   Days of Exercise per Week: 7 days   Minutes of Exercise per Session: 60 min  Stress: No Stress Concern Present   Feeling of Stress :  Not at all  Social Connections: Socially Integrated   Frequency of Communication with Friends and Family: More than three times a week   Frequency of Social Gatherings with Friends and Family: Once a week   Attends Religious Services: More than 4 times per year   Active Member of [employer] or Organizations: Yes   Attends Music therapist: More than 4 times per year   Marital Status: Married   Lives in a 76 year old house. Smoking: denies Occupation: retired  Programme researcher, broadcasting/film/video HistoryFreight forwarder in the house: no Charity fundraiser in the family room: no Carpet in the bedroom: yes Heating: gas Cooling: central Pet: no  Family History: Family History  Problem Relation Age of Onset   Hypertension Mother    Cancer Father        prostate   Cancer Sister        breast   Arthritis Other        family hx   Hyperlipidemia Other        family hx   Hypertension Other        family hx   Problem                               Relation Asthma                                   no Eczema                                daughter Food allergy                          daughter Allergic rhino conjunctivitis     no  Review of Systems  Constitutional:  Negative for appetite change, chills, fever and unexpected weight change.  HENT:  Negative for congestion and rhinorrhea.   Eyes:  Negative for itching.  Respiratory:  Negative for cough, chest tightness, shortness of breath and wheezing.   Cardiovascular:  Negative for chest pain.  Gastrointestinal:  Negative for abdominal pain.  Genitourinary:  Negative for difficulty urinating.  Skin:  Negative for rash.       itching  Allergic/Immunologic: Negative for environmental allergies and food allergies.  Neurological:  Negative for headaches.   Objective: BP 124/70   Pulse 80   Temp 98 F (36.7 C)   Resp 18   Ht 6' (1.829 m)   Wt 232 lb 6.4  oz (105.4 kg)   SpO2 96%   BMI 31.52 kg/m  Body mass index is 31.52 kg/m. Physical  Exam Vitals and nursing note reviewed.  Constitutional:      Appearance: Normal appearance. He is well-developed.  HENT:     Head: Normocephalic and atraumatic.     Right Ear: Tympanic membrane and external ear normal.     Left Ear: Tympanic membrane and external ear normal.     Nose: Nose normal.     Mouth/Throat:     Mouth: Mucous membranes are moist.     Pharynx: Oropharynx is clear.  Eyes:     Conjunctiva/sclera: Conjunctivae normal.  Cardiovascular:     Rate and Rhythm: Normal rate and regular rhythm.     Heart sounds: Normal heart sounds. No murmur heard.   No friction rub. No gallop.  Pulmonary:     Effort: Pulmonary effort is normal.     Breath sounds: Normal breath sounds. No wheezing, rhonchi or rales.  Musculoskeletal:     Cervical back: Neck supple.  Skin:    General: Skin is warm.     Findings: No rash.  Neurological:     Mental Status: He is alert and oriented to person, place, and time.  Psychiatric:        Behavior: Behavior normal.   The plan was reviewed with the patient/family, and all questions/concerned were addressed.  It was my pleasure to see Lee Smith today and participate in his care. Please feel free to contact me with any questions or concerns.  Sincerely,  Rexene Alberts, DO Allergy & Immunology  [facility] office: [place] office: [phone]

## 2021-06-25 NOTE — Assessment & Plan Note (Signed)
Perennial rhinitis symptoms with worsening in the spring and summer.  Tried Zyrtec and Flonase with some benefit.  No prior evaluation.  Denies reflux.  Today's skin prick testing showed: Negative to indoor/outdoor allergens. . Use Flonase (fluticasone) OR Nasacort (triamcinolone) nasal spray 1 spray per nostril twice a day as needed for nasal congestion.  . Nasal saline spray (i.e., Simply Saline) or nasal saline lavage (i.e., NeilMed) is recommended as needed and prior to medicated nasal sprays.

## 2021-06-25 NOTE — Patient Instructions (Addendum)
Today's skin testing showed: Negative to indoor/outdoor allergens and common foods. Results given.   Itching:  See below for proper skin care. Make sure you moisturize your skin daily and use fragrance free and dye free products.  Stop zyrtec. Take hydroxyzine 25mg  1/2 tablet to 1 tablet every 8 hours as needed for itching.  If you notice worsening urine flow then stop and let us know.  If it makes you drowsy let us know.  Get bloodwork We are ordering labs, so please allow 1-2 weeks for the results to come back. With the newly implemented Cures Act, the labs might be visible to you at the same time that they become visible to me. However, I will not address the results until all of the results are back, so please be patient.  In the meantime, continue recommendations in your patient instructions, including avoidance measures (if applicable), until you hear from me.  Rhinitis:  Use Flonase (fluticasone) OR Nasacort (triamcinolone) nasal spray 1 spray per nostril twice a day as needed for nasal congestion.  Nasal saline spray (i.e., Simply Saline) or nasal saline lavage (i.e., NeilMed) is recommended as needed and prior to medicated nasal sprays.  Wheezing: May use albuterol rescue inhaler 2 puffs every 4 to 6 hours as needed for shortness of breath, chest tightness, coughing, and wheezing. Monitor frequency of use.   Follow up in 4 months or sooner if needed.    Skin care recommendations  Bath time: Always use lukewarm water. AVOID very hot or cold water. Keep bathing time to 5-10 minutes. Do NOT use bubble bath. Use a mild soap and use just enough to wash the dirty areas. Do NOT scrub skin vigorously.  After bathing, pat dry your skin with a towel. Do NOT rub or scrub the skin.  Moisturizers and prescriptions:  ALWAYS apply moisturizers immediately after bathing (within 3 minutes). This helps to lock-in moisture. Use the moisturizer several times a day over the whole body. Good  summer moisturizers include: Aveeno, CeraVe, Cetaphil. Good winter moisturizers include: Aquaphor, Vaseline, Cerave, Cetaphil, Eucerin, Vanicream. When using moisturizers along with medications, the moisturizer should be applied about one hour after applying the medication to prevent diluting effect of the medication or moisturize around where you applied the medications. When not using medications, the moisturizer can be continued twice daily as maintenance.  Laundry and clothing: Avoid laundry products with added color or perfumes. Use unscented hypo-allergenic laundry products such as Tide free, Cheer free & gentle, and All free and clear.  If the skin still seems dry or sensitive, you can try double-rinsing the clothes. Avoid tight or scratchy clothing such as wool. Do not use fabric softeners or dyer sheets.

## 2021-06-26 DIAGNOSIS — L299 Pruritus, unspecified: Secondary | ICD-10-CM | POA: Diagnosis not present

## 2021-06-29 LAB — ALPHA-GAL PANEL
Allergen Lamb IgE: <0.1 kU/L
Beef IgE: <0.1 kU/L
IgE (Immunoglobulin E), Serum: 82 IU/mL (ref 6–495)
O215-IgE Alpha-Gal: <0.1 kU/L
Pork IgE: <0.1 kU/L

## 2021-06-29 LAB — TRYPTASE: Tryptase: 8.8 ug/L (ref 2.2–13.2)

## 2021-06-29 LAB — ANA W/REFLEX: Anti Nuclear Antibody (ANA): NEGATIVE

## 2021-07-14 ENCOUNTER — Telehealth: Payer: Self-pay | Admitting: Family Medicine

## 2021-07-14 NOTE — Telephone Encounter (Signed)
Spoke to patient to schedule Medicare Annual Wellness Visit (AWV) either virtually or in office.   Patient declined    Last AWV 12/05/19  please schedule at anytime with [facility] Nurse Health Advisor 1 or 2   This should be a 45 minute visit.

## 2021-07-27 DIAGNOSIS — Z20822 Contact with and (suspected) exposure to covid-19: Secondary | ICD-10-CM | POA: Diagnosis not present

## 2021-11-03 ENCOUNTER — Ambulatory Visit (INDEPENDENT_AMBULATORY_CARE_PROVIDER_SITE_OTHER): Payer: Medicare Other | Admitting: Allergy

## 2021-11-03 ENCOUNTER — Encounter: Payer: Self-pay | Admitting: Allergy

## 2021-11-03 VITALS — BP 122/76 | HR 86 | Temp 97.8°F | Resp 18 | Ht 72.0 in | Wt 233.8 lb

## 2021-11-03 DIAGNOSIS — L299 Pruritus, unspecified: Secondary | ICD-10-CM | POA: Diagnosis not present

## 2021-11-03 DIAGNOSIS — J310 Chronic rhinitis: Secondary | ICD-10-CM | POA: Diagnosis not present

## 2021-11-03 DIAGNOSIS — R062 Wheezing: Secondary | ICD-10-CM | POA: Diagnosis not present

## 2021-11-03 MED ORDER — EPICERAM EX EMUL: CUTANEOUS | 5 refills | Status: DC

## 2021-11-03 NOTE — Assessment & Plan Note (Signed)
Past history - Pruritus started in the spring 2022 and getting worse.  No specific triggers noted.  Saw dermatology and had patch testing which was negative.  CBC differential, CMP, TSH, CXR normal.  Tried triamcinolone and Zyrtec with some benefit.  Prednisone ineffective. Tried to stop some of his medications with no improvement. Rarely has a rash with this. 2022 skin prick testing showed: Negative to indoor/outdoor allergens and common foods. ?Interim history - 2022 bloodwork normal (ANA, tryptase, alpha gal). 50% improvement with skin care and hydroxyzine.  ?? Continue proper skin care. ?? Use epicerum moisturizer 1-2 times a day. ?o Samples given and prescription sent in.  ?? Make sure you moisturize your skin daily and use fragrance free and dye free products.  ?? Start allegra '180mg'$  daily in the morning. Samples given.  ?? Take hydroxyzine '25mg'$  1 tablet every 8 hours as needed for itching.  ?o If you notice worsening urine flow then stop and let us know.  ?o If it makes you drowsy let us know.  ?

## 2021-11-03 NOTE — Assessment & Plan Note (Signed)
Past history - Perennial rhinitis symptoms with worsening in the spring and summer.  Tried Zyrtec and Flonase with some benefit.  2022 skin prick testing showed: Negative to indoor/outdoor allergens. ?Interim history - noted some nasal congestion with high pollen counts.  ?? Use Flonase (fluticasone) OR Nasacort (triamcinolone) nasal spray 1 spray per nostril twice a day as needed for nasal congestion.  ?? Nasal saline spray (i.e., Simply Saline) or nasal saline lavage (i.e., NeilMed) is recommended as needed and prior to medicated nasal sprays. ?o Neilmed bottle given. ?

## 2021-11-03 NOTE — Progress Notes (Signed)
? ?Follow Up Note ? ?RE: Lee Smith MRN: [id number] DOB: 08/03/1945 ?Date of Office Visit: 11/03/2021 ? ?Referring provider: [name], MD ?Primary care provider: [name], MD ? ?Chief Complaint: Pruritus (Shea butter body wash helps and less showers. ), Medication Management, and Allergic Rhinitis  (Possible tree pollen allergy. Eye itching, congestion, stuffy nose.) ? ?History of Present Illness: ?I had the pleasure of seeing Lee Smith for a follow up visit at the [facility] on 11/03/2021. He is a 76 y.o. male, who is being followed for pruritus, nonallergic rhinitis and wheezing. His previous allergy office visit was on 06/25/2021 with Dr. Maudie Mercury. Today is a regular follow up visit. ? ?Pruritus ?Taking showers every 1-2 days now instead of multiple ones during the day. ? ?He is trying to moisturize better and using a shea butter based lotion. ?Taking hydroxyzine '25mg'$  1 tablet at night with some benefit.  ?Noticed some drowsiness with hydroxyzine if taken in the mornings.  ?Symptoms are about 50% improved with above regimen. ? ?Aveeno soap caused more itching.  ? ?Chronic rhinitis ?Noted some nasal congestion. ?Taking Flonase 1 spray per each nostril once a day with some benefit.  ?  ?Wheezing ?Only used albuterol 2-3 times the past 4 months with good benefit.  ? ?Assessment and Plan: ?Lee Smith is a 76 y.o. male with: ?Pruritus ?Past history - Pruritus started in the spring 2022 and getting worse.  No specific triggers noted.  Saw dermatology and had patch testing which was negative.  CBC differential, CMP, TSH, CXR normal.  Tried triamcinolone and Zyrtec with some benefit.  Prednisone ineffective. Tried to stop some of his medications with no improvement. Rarely has a rash with this. 2022 skin prick testing showed: Negative to indoor/outdoor allergens and common foods. ?Interim history - 2022 bloodwork normal (ANA, tryptase, alpha gal). 50% improvement with skin care  and hydroxyzine.  ?Continue proper skin care. ?Use epicerum moisturizer 1-2 times a day. ?Samples given and prescription sent in.  ?Make sure you moisturize your skin daily and use fragrance free and dye free products.  ?Start allegra '180mg'$  daily in the morning. Samples given.  ?Take hydroxyzine '25mg'$  1 tablet every 8 hours as needed for itching.  ?If you notice worsening urine flow then stop and let us know.  ?If it makes you drowsy let us know.  ? ?Chronic rhinitis ?Past history - Perennial rhinitis symptoms with worsening in the spring and summer.  Tried Zyrtec and Flonase with some benefit.  2022 skin prick testing showed: Negative to indoor/outdoor allergens. ?Interim history - noted some nasal congestion with high pollen counts.  ?Use Flonase (fluticasone) OR Nasacort (triamcinolone) nasal spray 1 spray per nostril twice a day as needed for nasal congestion.  ?Nasal saline spray (i.e., Simply Saline) or nasal saline lavage (i.e., NeilMed) is recommended as needed and prior to medicated nasal sprays. ?Neilmed bottle given. ? ?Wheezing ?Past history - Rare episodes of wheezing from the "throat" and uses albuterol prn with good benefit. ?Interim history - Used albuterol less than once per month since last OV. ?May use albuterol rescue inhaler 2 puffs every 4 to 6 hours as needed for shortness of breath, chest tightness, coughing, and wheezing. Monitor frequency of use.  ? ?Return in about 4 months (around 03/05/2022). ? ?Meds ordered this encounter  ?Medications  ? Dermatological Products, Misc. [facility]) lotion  ?  Sig: Apply 1-2 times per day as a moisturizer.  ?  Dispense:  225  g  ?  Refill:  5  ?  [phone]  ? ?Lab Orders  ?No laboratory test(s) ordered today  ? ? ?Diagnostics: ?None.  ? ?Medication List:  ?Current Outpatient Medications  ?Medication Sig Dispense Refill  ? acetaminophen (TYLENOL) 500 MG tablet Take 500 mg by mouth every 6 (six) hours as needed.    ? albuterol (VENTOLIN HFA) 108 (90 Base)  MCG/ACT inhaler Inhale 2 puffs into the lungs every 6 (six) hours as needed for wheezing or shortness of breath. 1 each 0  ? amLODipine (NORVASC) 5 MG tablet Take 1 tablet (5 mg total) by mouth daily. 90 tablet 3  ? atorvastatin (LIPITOR) 20 MG tablet Take 1 tablet (20 mg total) by mouth daily. *labs required for any future refills* 90 tablet 3  ? Cholecalciferol (D3-50 PO) Take by mouth.    ? Dermatological Products, Misc. [facility]) lotion Apply 1-2 times per day as a moisturizer. 225 g 5  ? fluticasone (FLONASE) 50 MCG/ACT nasal spray Place into both nostrils daily.    ? hydrOXYzine (ATARAX) 25 MG tablet Take 1/2 tablet to 1 tablet every 8 hours as needed for itching. 90 tablet 2  ? losartan (COZAAR) 100 MG tablet Take 1 tablet (100 mg total) by mouth daily. 90 tablet 3  ? meloxicam (MOBIC) 15 MG tablet Take 1 tablet (15 mg total) by mouth daily. 90 tablet 1  ? Naproxen Sodium 220 MG CAPS Take by mouth.    ? tadalafil (CIALIS) 20 MG tablet tadalafil 20 mg tablet ? Take 1 tablet every day by oral route.    ? tamsulosin (FLOMAX) 0.4 MG CAPS capsule Take 0.4 mg by mouth.    ? triamcinolone cream (KENALOG) 0.1 % Apply 1 application topically 2 (two) times daily. Apply to affected areas bid as needed. 453 g 0  ? vitamin B-12 (CYANOCOBALAMIN) 500 MCG tablet Take 2 tablets by mouth daily.    ? ?No current facility-administered medications for this visit.  ? ?Allergies: ?No Known Allergies ?I reviewed his past medical history, social history, family history, and environmental history and no significant changes have been reported from his previous visit. ? ?Review of Systems  ?Constitutional:  Negative for appetite change, chills, fever and unexpected weight change.  ?HENT:  Positive for congestion. Negative for rhinorrhea.   ?Eyes:  Negative for itching.  ?Respiratory:  Negative for cough, chest tightness, shortness of breath and wheezing.   ?Cardiovascular:  Negative for chest pain.  ?Gastrointestinal:  Negative for  abdominal pain.  ?Genitourinary:  Negative for difficulty urinating.  ?Skin:  Negative for rash.  ?     itching  ?Allergic/Immunologic: Negative for environmental allergies and food allergies.  ?Neurological:  Negative for headaches.  ? ?Objective: ?BP 122/76   Pulse 86   Temp 97.8 ?F (36.6 ?C)   Resp 18   Ht 6' (1.829 m)   Wt 233 lb 12.8 oz (106.1 kg)   SpO2 97%   BMI 31.71 kg/m?  ?Body mass index is 31.71 kg/m?Marland Kitchen ?Physical Exam ?Vitals and nursing note reviewed.  ?Constitutional:   ?   Appearance: Normal appearance. He is well-developed.  ?HENT:  ?   Head: Normocephalic and atraumatic.  ?   Right Ear: Tympanic membrane and external ear normal.  ?   Left Ear: Tympanic membrane and external ear normal.  ?   Nose: Nose normal.  ?   Mouth/Throat:  ?   Mouth: Mucous membranes are moist.  ?   Pharynx: Oropharynx is clear.  ?  Eyes:  ?   Conjunctiva/sclera: Conjunctivae normal.  ?Cardiovascular:  ?   Rate and Rhythm: Normal rate and regular rhythm.  ?   Heart sounds: Normal heart sounds. No murmur heard. ?  No friction rub. No gallop.  ?Pulmonary:  ?   Effort: Pulmonary effort is normal.  ?   Breath sounds: Normal breath sounds. No wheezing, rhonchi or rales.  ?Musculoskeletal:  ?   Cervical back: Neck supple.  ?Skin: ?   General: Skin is warm.  ?   Findings: No rash.  ?Neurological:  ?   Mental Status: He is alert and oriented to person, place, and time.  ?Psychiatric:     ?   Behavior: Behavior normal.  ? ?Previous notes and tests were reviewed. ?The plan was reviewed with the patient/family, and all questions/concerned were addressed. ? ?It was my pleasure to see Lee Smith today and participate in his care. Please feel free to contact me with any questions or concerns. ? ?Sincerely, ? ?Rexene Alberts, DO ?Allergy & Immunology ? ?[facility] [place] ?[place] office: [phone] ?[place] office: [phone] ?

## 2021-11-03 NOTE — Assessment & Plan Note (Signed)
Past history - Rare episodes of wheezing from the "throat" and uses albuterol prn with good benefit. ?Interim history - Used albuterol less than once per month since last OV. ?? May use albuterol rescue inhaler 2 puffs every 4 to 6 hours as needed for shortness of breath, chest tightness, coughing, and wheezing. Monitor frequency of use.  ?

## 2021-11-03 NOTE — Patient Instructions (Addendum)
Itching:  ?Continue proper skin care. ?Use epicerum moisturizer 1-2 times a day. ?Samples given and prescription sent in.  ?Make sure you moisturize your skin daily and use fragrance free and dye free products.  ?Start allegra '180mg'$  daily in the morning.  ?Take hydroxyzine '25mg'$  1 tablet every 8 hours as needed for itching.  ?If you notice worsening urine flow then stop and let us know.  ?If it makes you drowsy let us know.  ? ?Rhinitis:  ?Use Flonase (fluticasone) OR Nasacort (triamcinolone) nasal spray 1 spray per nostril twice a day as needed for nasal congestion.  ?Nasal saline spray (i.e., Simply Saline) or nasal saline lavage (i.e., NeilMed) is recommended as needed and prior to medicated nasal sprays. ? ?Wheezing: ?May use albuterol rescue inhaler 2 puffs every 4 to 6 hours as needed for shortness of breath, chest tightness, coughing, and wheezing. Monitor frequency of use.  ? ?Follow up in 4 months or sooner if needed.   ? ?Buffered Isotonic Saline Irrigations: ? ?Goal: ?When you irrigate with the isotonic saline (salt water) it washes mucous and other debris from your nose that could be contributing to your nasal symptoms.  ? ?Recipe: ?Obtain 1 quart jar that is clean ?Fill with clean (bottled, boiled or distilled) water ?Add 1-2 heaping teaspoons of salt without iodine ?If the solution with 2 teaspoons of salt is too strong, adjust the amount down until better tolerated ?Add 1 teaspoon of [employer] baking soda (pure bicarbonate) ?Mix ingredients together and store at room temperature and discard after 1 week ?* Alternatively you can buy pre made salt packets for the NeilMed bottle or there    ?      are other over the counter brands available ? ?Instructions: ?Warm ? cup of the solution in the microwave if desired but be careful not to overheat as this will burn the inside of your nose ?Stand over a sink (or do it while you shower) and squirt the solution into one side of your nose aiming towards the  back of your head ?Sometimes saying ?coca cola? while irrigating can be helpful to prevent fluid from going down your throat  ?The solution will travel to the back of your nose and then come out the other side ?Perform this again on the other side ?Try to do this twice a day ?If you are using a nasal spray in addition to the irrigation, irrigate first and then use the topical nasal spray otherwise you will wash the nasal spray out of your nose ? ? ?Skin care recommendations ? ?Bath time: ?Always use lukewarm water. AVOID very hot or cold water. ?Keep bathing time to 5-10 minutes. ?Do NOT use bubble bath. ?Use a mild soap and use just enough to wash the dirty areas. ?Do NOT scrub skin vigorously.  ?After bathing, pat dry your skin with a towel. Do NOT rub or scrub the skin. ? ?Moisturizers and prescriptions:  ?ALWAYS apply moisturizers immediately after bathing (within 3 minutes). This helps to lock-in moisture. ?Use the moisturizer several times a day over the whole body. ?Good summer moisturizers include: Aveeno, CeraVe, Cetaphil. ?Good winter moisturizers include: Aquaphor, Vaseline, Cerave, Cetaphil, Eucerin, Vanicream. ?When using moisturizers along with medications, the moisturizer should be applied about one hour after applying the medication to prevent diluting effect of the medication or moisturize around where you applied the medications. When not using medications, the moisturizer can be continued twice daily as maintenance. ? ?Laundry and clothing: ?Avoid laundry products  with added color or perfumes. ?Use unscented hypo-allergenic laundry products such as Tide free, Cheer free & gentle, and All free and clear.  ?If the skin still seems dry or sensitive, you can try double-rinsing the clothes. ?Avoid tight or scratchy clothing such as wool. ?Do not use fabric softeners or dyer sheets.  ?

## 2021-11-09 DIAGNOSIS — L821 Other seborrheic keratosis: Secondary | ICD-10-CM | POA: Diagnosis not present

## 2021-11-09 DIAGNOSIS — L570 Actinic keratosis: Secondary | ICD-10-CM | POA: Diagnosis not present

## 2021-11-09 DIAGNOSIS — L814 Other melanin hyperpigmentation: Secondary | ICD-10-CM | POA: Diagnosis not present

## 2021-11-09 DIAGNOSIS — R233 Spontaneous ecchymoses: Secondary | ICD-10-CM | POA: Diagnosis not present

## 2021-11-09 DIAGNOSIS — D225 Melanocytic nevi of trunk: Secondary | ICD-10-CM | POA: Diagnosis not present

## 2021-11-09 DIAGNOSIS — L298 Other pruritus: Secondary | ICD-10-CM | POA: Diagnosis not present

## 2021-11-11 ENCOUNTER — Ambulatory Visit (INDEPENDENT_AMBULATORY_CARE_PROVIDER_SITE_OTHER): Payer: Medicare Other | Admitting: Family Medicine

## 2021-11-11 ENCOUNTER — Encounter: Payer: Self-pay | Admitting: Family Medicine

## 2021-11-11 VITALS — BP 130/76 | HR 69 | Temp 97.6°F | Ht 71.0 in | Wt 237.8 lb

## 2021-11-11 DIAGNOSIS — I10 Essential (primary) hypertension: Secondary | ICD-10-CM | POA: Diagnosis not present

## 2021-11-11 DIAGNOSIS — Z0000 Encounter for general adult medical examination without abnormal findings: Secondary | ICD-10-CM | POA: Diagnosis not present

## 2021-11-11 DIAGNOSIS — E785 Hyperlipidemia, unspecified: Secondary | ICD-10-CM | POA: Diagnosis not present

## 2021-11-11 DIAGNOSIS — R252 Cramp and spasm: Secondary | ICD-10-CM

## 2021-11-11 LAB — LIPID PANEL
Cholesterol: 139 mg/dL (ref 0–200)
HDL: 47.3 mg/dL (ref >39.00)
LDL Cholesterol: 81 mg/dL (ref 0–99)
NonHDL: 92.16
Total CHOL/HDL Ratio: 3
Triglycerides: 57 mg/dL (ref 0.0–149.0)
VLDL: 11.4 mg/dL (ref 0.0–40.0)

## 2021-11-11 MED ORDER — ATORVASTATIN CALCIUM 20 MG PO TABS: 20.0000 mg | ORAL_TABLET | Freq: Every day | ORAL | 3 refills | Status: DC

## 2021-11-11 MED ORDER — AMLODIPINE BESYLATE 5 MG PO TABS: 5.0000 mg | ORAL_TABLET | Freq: Every day | ORAL | 3 refills | Status: DC

## 2021-11-11 MED ORDER — LOSARTAN POTASSIUM 100 MG PO TABS: 100.0000 mg | ORAL_TABLET | Freq: Every day | ORAL | 3 refills | Status: DC

## 2021-11-11 NOTE — Progress Notes (Signed)
? ?Annual Wellness Visit ? ?  ? ?Patient: Lee Smith, Male    DOB: [birth year], 76 y.o.   MRN: [id number] ? ?Subjective  ?Chief Complaint  ?Patient presents with  ? Medicare Wellness  ? ? ?Lee Smith is a 76 y.o. male who presents today for his Annual Wellness Visit and medical follow-up.  He has been battling with some chronic rhinitis issues and saw allergist.  Has been trying moisturizers and has seen some improvement.  He had work-up for generalized pruritus which was basically unremarkable. ? ?He has hyperlipidemia.  Treated with atorvastatin 20 mg daily.  Needs follow-up lipid panel.  He did have recent liver panel which was normal.  This does not need to be repeated today.  He has hypertension treated with amlodipine 5 mg daily and losartan 100 mg daily.  No recent dizziness.  Compliant with therapies.  No chest pains. ? ?Does have another issue which is cramps involving multiple muscles including legs and hands and occasionally even side.  Does try to stay very well-hydrated.  Does take occasional multivitamin. ? ?Generally doing well.  Is exercising with walking couple miles several days per week.  No recent falls.  Mood stable. ? ?HPI ? ?He gets vision screen through the [place] every 1 to 2 years.  They have done glaucoma screening there as well.  He had previous cataract surgery on both eyes. ? ? ?Past Medical History:  ?Diagnosis Date  ? Allergic rhinitis   ? Arthritis   ? bilateral knees; has planned partial knee replacement next month  ? Genital warts   ? History of kidney stones 11/16/2013  ? lithotripsy for right ureteral stone  ? Hx of colonic polyps   ? 2008  ? Hyperlipidemia   ? Hypertension   ? Recurrent upper respiratory infection (URI)   ? Right nephrolithiasis   ? ?Past Surgical History:  ?Procedure Laterality Date  ? ADENOIDECTOMY    ? CHOLECYSTECTOMY  07/19/2000  ? CYSTOSCOPY W/ URETERAL STENT PLACEMENT Right 02/12/2014  ? Procedure: CYSTOSCOPY WITH RETROGRADE PYELOGRAM/URETERAL STENT  PLACEMENT;  Surgeon: Festus Aloe, MD;  Location: [facility];  Service: Urology;  Laterality: Right;  ? CYSTOSCOPY WITH RETROGRADE PYELOGRAM, URETEROSCOPY AND STENT PLACEMENT Right 02/12/2014  ? Procedure: RIGHT URETEROSCOPY ;  Surgeon: Festus Aloe, MD;  Location[facility];  Service: Urology;  Laterality: Right;  ? CYSTOSCOPY WITH URETEROSCOPY AND STENT PLACEMENT Right 03/05/2014  ? Procedure: CYSTOSCOPY WITH URETEROSCOPY AND STENT EXCHANGE, STONE EXTRACTION;  Surgeon: Festus Aloe, MD;  Location: [facility];  Service: Urology;  Laterality: Right;  ? CYSTOSCOPY WITH URETEROSCOPY AND STENT PLACEMENT Right 03/26/2014  ? Procedure: CYSTO WITH RIGHT URETEROSCOPY/STENT PLACEMENT;  Surgeon: Festus Aloe, MD;  Location[facility]  Service: Urology;  Laterality: Right;  ? EXTRACORPOREAL SHOCK WAVE LITHOTRIPSY Right 12/17/2013  ? HOLMIUM LASER APPLICATION Right 10/62/6948  ? Procedure: HOLMIUM LASER APPLICATION;  Surgeon: Festus Aloe, MD;  Location: [facility];  Service: Urology;  Laterality: Right;  ? INGUINAL HERNIA REPAIR Bilateral right 2003/   left 2004  ? STONE EXTRACTION WITH BASKET Right 03/26/2014  ? Procedure: STONE EXTRACTION WITH BASKET;  Surgeon: Festus Aloe, MD;  Location[facility];  Service: Urology;  Laterality: Right;  ? TONSILLECTOMY    ? 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 UMBILICAL HERNIA REPAIR  07/19/1998  ? ?Social History  ? ?Tobacco Use  ? Smoking status: Former  ?  Packs/day:  1.00  ?  Years: 6.00  ?  Pack years: 6.00  ?  Types: Cigarettes  ?  Quit date: 10/12/1970  ?  Years since quitting: 51.1  ? Smokeless tobacco: Former  ?  Types: Chew  ?Substance Use Topics  ? Alcohol use: Yes  ?  Alcohol/week: 7.0 standard drinks  ?  Types: 7 Glasses of wine per week  ?  Comment: never drinks more than one per day  ? Drug use: No  ? ?Family History  ?Problem Relation Age of  Onset  ? Hypertension Mother   ? Cancer Father   ?     prostate  ? Cancer Sister   ?     breast  ? Arthritis Other   ?     family hx  ? Hyperlipidemia Other   ?     family hx  ? Hypertension Other   ?     family hx  ? ?  ? ?Medications: ?Outpatient Medications Prior to Visit  ?Medication Sig  ? acetaminophen (TYLENOL) 500 MG tablet Take 500 mg by mouth every 6 (six) hours as needed.  ? albuterol (VENTOLIN HFA) 108 (90 Base) MCG/ACT inhaler Inhale 2 puffs into the lungs every 6 (six) hours as needed for wheezing or shortness of breath.  ? Cholecalciferol (D3-50 PO) Take by mouth.  ? Dermatological Products, Misc. [facility]) lotion Apply 1-2 times per day as a moisturizer.  ? fluticasone (FLONASE) 50 MCG/ACT nasal spray Place into both nostrils daily.  ? hydrOXYzine (ATARAX) 25 MG tablet Take 1/2 tablet to 1 tablet every 8 hours as needed for itching.  ? meloxicam (MOBIC) 15 MG tablet Take 1 tablet (15 mg total) by mouth daily.  ? Naproxen Sodium 220 MG CAPS Take by mouth.  ? tadalafil (CIALIS) 20 MG tablet tadalafil 20 mg tablet ? Take 1 tablet every day by oral route.  ? tamsulosin (FLOMAX) 0.4 MG CAPS capsule Take 0.4 mg by mouth.  ? triamcinolone cream (KENALOG) 0.1 % Apply 1 application topically 2 (two) times daily. Apply to affected areas bid as needed.  ? vitamin B-12 (CYANOCOBALAMIN) 500 MCG tablet Take 2 tablets by mouth daily.  ? [DISCONTINUED] amLODipine (NORVASC) 5 MG tablet Take 1 tablet (5 mg total) by mouth daily.  ? [DISCONTINUED] atorvastatin (LIPITOR) 20 MG tablet Take 1 tablet (20 mg total) by mouth daily. *labs required for any future refills*  ? [DISCONTINUED] losartan (COZAAR) 100 MG tablet Take 1 tablet (100 mg total) by mouth daily.  ? ?No facility-administered medications prior to visit.  ?  ?No Known Allergies ? ?Patient Care Team: ?[name], MD as PCP - General ?Alda Berthold, DO as Consulting Physician (Neurology) ? ?Review of Systems  ?Constitutional:  Negative for chills,  fever and weight loss.  ?HENT:    ?     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  ?Respiratory:  Negative for cough and hemoptysis.   ?Cardiovascular:  Negative for chest pain.  ?     Mild left ankle edema late day but improves with elevation  ?Gastrointestinal:  Negative for abdominal pain, constipation, diarrhea, melena, nausea and vomiting.  ?Genitourinary:  Negative for dysuria.  ?Musculoskeletal:  Positive for joint pain.  ?Neurological:  Negative for dizziness and headaches.  ?Psychiatric/Behavioral:  Negative for memory loss.   ? ? ?  ? ?Objective  ?BP 130/76 (BP Location: Left Arm, Patient Position: Sitting, Cuff Size: Normal)   Pulse 69   Temp 97.6 ?  F (36.4 ?C) (Oral)   Ht '5\' 11"'$  (1.803 m)   Wt 237 lb 12.8 oz (107.9 kg)   SpO2 97%   BMI 33.17 kg/m?  ?BP Readings from Last 3 Encounters:  ?11/11/21 130/76  ?11/03/21 122/76  ?06/25/21 124/70  ? ?  ? ?Physical Exam ?Constitutional:   ?   Appearance: He is well-developed.  ?HENT:  ?   Right Ear: External ear normal.  ?   Left Ear: External ear normal.  ?   Mouth/Throat:  ?   Mouth: Mucous membranes are moist.  ?   Pharynx: Oropharynx is clear.  ?Eyes:  ?   Pupils: Pupils are equal, round, and reactive to light.  ?Neck:  ?   Thyroid: No thyromegaly.  ?Cardiovascular:  ?   Rate and Rhythm: Normal rate and regular rhythm.  ?Pulmonary:  ?   Effort: Pulmonary effort is normal. No respiratory distress.  ?   Breath sounds: Normal breath sounds. No wheezing or rales.  ?Abdominal:  ?   Palpations: Abdomen is soft. There is no mass.  ?   Tenderness: There is no abdominal tenderness.  ?Musculoskeletal:  ?   Cervical back: Neck supple.  ?   Right lower leg: No edema.  ?   Left lower leg: No edema.  ?Neurological:  ?   Mental Status: He is alert and oriented to person, place, and time.  ? ? ? ? ?Most recent functional status assessment: ?   ? View : No data to display.  ?  ?  ?  ? ?Most recent fall risk assessment: ? ?  05/18/2021  ? 11:37 AM   ?Fall Risk   ?Falls in the past year? 0  ? ? Most recent depression screenings: ? ?  05/18/2021  ? 11:37 AM 12/05/2019  ?  8:12 AM  ?PHQ 2/9 Scores  ?PHQ - 2 Score 0 0  ? ?Most recent cognitive screening: ? ?Normal

## 2021-11-11 NOTE — Patient Instructions (Signed)
For muscle cramps: ? ?-consider OTC Magnesium 400 mg once daily ?-Consider Multi-B vitamin. ?-consider tumeric (mustard).  ?

## 2021-11-16 ENCOUNTER — Emergency Department (HOSPITAL_COMMUNITY): Payer: Medicare Other | Admitting: Certified Registered Nurse Anesthetist

## 2021-11-16 ENCOUNTER — Encounter (HOSPITAL_BASED_OUTPATIENT_CLINIC_OR_DEPARTMENT_OTHER): Payer: Self-pay

## 2021-11-16 ENCOUNTER — Emergency Department (HOSPITAL_BASED_OUTPATIENT_CLINIC_OR_DEPARTMENT_OTHER): Payer: Medicare Other | Admitting: Certified Registered Nurse Anesthetist

## 2021-11-16 ENCOUNTER — Encounter (HOSPITAL_COMMUNITY)
Admission: EM | Disposition: A | Discharge status: Home / Self Care | Payer: Self-pay | Source: Home / Self Care | Attending: Emergency Medicine

## 2021-11-16 ENCOUNTER — Ambulatory Visit (HOSPITAL_BASED_OUTPATIENT_CLINIC_OR_DEPARTMENT_OTHER)
Admission: EM | Admit: 2021-11-16 | Discharge: 2021-11-17 | Disposition: A | Discharge status: Home / Self Care | Payer: Medicare Other | Attending: Emergency Medicine | Admitting: Emergency Medicine

## 2021-11-16 ENCOUNTER — Other Ambulatory Visit: Payer: Self-pay

## 2021-11-16 ENCOUNTER — Ambulatory Visit (HOSPITAL_COMMUNITY): Admit: 2021-11-16 | Payer: Medicare Other | Admitting: Gastroenterology

## 2021-11-16 DIAGNOSIS — Q398 Other congenital malformations of esophagus: Secondary | ICD-10-CM | POA: Diagnosis not present

## 2021-11-16 DIAGNOSIS — K209 Esophagitis, unspecified without bleeding: Secondary | ICD-10-CM | POA: Insufficient documentation

## 2021-11-16 DIAGNOSIS — T18128A Food in esophagus causing other injury, initial encounter: Secondary | ICD-10-CM | POA: Insufficient documentation

## 2021-11-16 DIAGNOSIS — R1310 Dysphagia, unspecified: Secondary | ICD-10-CM | POA: Diagnosis not present

## 2021-11-16 DIAGNOSIS — K2270 Barrett's esophagus without dysplasia: Secondary | ICD-10-CM

## 2021-11-16 DIAGNOSIS — I10 Essential (primary) hypertension: Secondary | ICD-10-CM | POA: Insufficient documentation

## 2021-11-16 DIAGNOSIS — T189XXA Foreign body of alimentary tract, part unspecified, initial encounter: Secondary | ICD-10-CM

## 2021-11-16 DIAGNOSIS — K222 Esophageal obstruction: Secondary | ICD-10-CM | POA: Diagnosis not present

## 2021-11-16 DIAGNOSIS — X58XXXA Exposure to other specified factors, initial encounter: Secondary | ICD-10-CM | POA: Insufficient documentation

## 2021-11-16 DIAGNOSIS — Z87891 Personal history of nicotine dependence: Secondary | ICD-10-CM | POA: Insufficient documentation

## 2021-11-16 HISTORY — PX: IMPACTION REMOVAL: SHX5858

## 2021-11-16 HISTORY — PX: ESOPHAGOGASTRODUODENOSCOPY: SHX5428

## 2021-11-16 SURGERY — EGD (ESOPHAGOGASTRODUODENOSCOPY)
Anesthesia: General

## 2021-11-16 MED ORDER — FENTANYL CITRATE (PF) 100 MCG/2ML IJ SOLN
INTRAMUSCULAR | Status: AC
  Filled 2021-11-16: qty 2

## 2021-11-16 MED ORDER — OMEPRAZOLE MAGNESIUM 20 MG PO TBEC: 20.0000 mg | DELAYED_RELEASE_TABLET | Freq: Two times a day (BID) | ORAL | 1 refills | Status: DC

## 2021-11-16 MED ORDER — LACTATED RINGERS IV SOLN: INTRAVENOUS | Status: DC

## 2021-11-16 MED ORDER — PROPOFOL 10 MG/ML IV BOLUS
INTRAVENOUS | Status: AC
  Filled 2021-11-16: qty 20

## 2021-11-16 MED ORDER — LIDOCAINE 2% (20 MG/ML) 5 ML SYRINGE
INTRAMUSCULAR | Status: DC | PRN
  Administered 2021-11-16: 80 mg via INTRAVENOUS

## 2021-11-16 MED ORDER — LORAZEPAM 2 MG/ML IJ SOLN
1.0000 mg | Freq: Once | INTRAMUSCULAR | Status: AC
  Administered 2021-11-16: 1 mg via INTRAVENOUS
  Filled 2021-11-16: qty 1

## 2021-11-16 MED ORDER — PROPOFOL 10 MG/ML IV BOLUS
INTRAVENOUS | Status: DC | PRN
  Administered 2021-11-16: 170 mg via INTRAVENOUS

## 2021-11-16 MED ORDER — FENTANYL CITRATE (PF) 100 MCG/2ML IJ SOLN: 25.0000 ug | INTRAMUSCULAR | Status: DC | PRN

## 2021-11-16 MED ORDER — DEXAMETHASONE SODIUM PHOSPHATE 10 MG/ML IJ SOLN
INTRAMUSCULAR | Status: DC | PRN
  Administered 2021-11-16: 10 mg via INTRAVENOUS

## 2021-11-16 MED ORDER — GLUCAGON HCL RDNA (DIAGNOSTIC) 1 MG IJ SOLR
1.0000 mg | Freq: Once | INTRAMUSCULAR | Status: AC
  Administered 2021-11-16: 1 mg via INTRAVENOUS
  Filled 2021-11-16: qty 1

## 2021-11-16 MED ORDER — ONDANSETRON HCL 4 MG/2ML IJ SOLN
INTRAMUSCULAR | Status: DC | PRN
  Administered 2021-11-16: 4 mg via INTRAVENOUS

## 2021-11-16 MED ORDER — AMISULPRIDE (ANTIEMETIC) 5 MG/2ML IV SOLN
10.0000 mg | Freq: Once | INTRAVENOUS | Status: DC | PRN
  Filled 2021-11-16: qty 4

## 2021-11-16 MED ORDER — FENTANYL CITRATE (PF) 100 MCG/2ML IJ SOLN
INTRAMUSCULAR | Status: DC | PRN
  Administered 2021-11-16: 100 ug via INTRAVENOUS

## 2021-11-16 MED ORDER — SUCCINYLCHOLINE CHLORIDE 200 MG/10ML IV SOSY
PREFILLED_SYRINGE | INTRAVENOUS | Status: DC | PRN
Start: 2021-11-16 — End: 2021-11-16
  Administered 2021-11-16: 140 mg via INTRAVENOUS

## 2021-11-16 NOTE — ED Notes (Signed)
Attempted to call Endo for report, phone number goes straight to voicemail. Report given to Anderson Malta, CN at [facility] ED.  ?

## 2021-11-16 NOTE — Discharge Instructions (Addendum)
YOU HAD AN ENDOSCOPIC PROCEDURE TODAY: Refer to the procedure report and other information in the discharge instructions given to you for any specific questions about what was found during the examination. If this information does not answer your questions, please call [facility] office at [phone] to clarify.  ? ?YOU SHOULD EXPECT: Some feelings of bloating in the abdomen. Passage of more gas than usual. Walking can help get rid of the air that was put into your GI tract during the procedure and reduce the bloating. If you had a lower endoscopy (such as a colonoscopy or flexible sigmoidoscopy) you may notice spotting of blood in your stool or on the toilet paper. Some abdominal soreness may be present for a day or two, also. ? ?DIET: Your first meal following the procedure should be a light meal and then it is ok to progress to your normal diet. A half-sandwich or bowl of soup is an example of a good first meal. Heavy or fried foods are harder to digest and may make you feel nauseous or bloated. Drink plenty of fluids but you should avoid alcoholic beverages for 24 hours. If you had a esophageal dilation, please see attached instructions for diet.   ? ?ACTIVITY: Your care partner should take you home directly after the procedure. You should plan to take it easy, moving slowly for the rest of the day. You can resume normal activity the day after the procedure however YOU SHOULD NOT DRIVE, use power tools, machinery or perform tasks that involve climbing or major physical exertion for 24 hours (because of the sedation medicines used during the test).  ? ?SYMPTOMS TO REPORT IMMEDIATELY: ?A gastroenterologist can be reached at any hour. Please call [phone]  for any of the following symptoms:  ?Following lower endoscopy (colonoscopy, flexible sigmoidoscopy) ?Excessive amounts of blood in the stool  ?Significant tenderness, worsening of abdominal pains  ?Swelling of the abdomen that is new, acute  ?Fever of 100?  or higher  ?Following upper endoscopy (EGD, EUS, ERCP, esophageal dilation) ?Vomiting of blood or coffee ground material  ?New, significant abdominal pain  ?New, significant chest pain or pain under the shoulder blades  ?Painful or persistently difficult swallowing  ?New shortness of breath  ?Black, tarry-looking or red, bloody stools ? ?FOLLOW UP:  ?If any biopsies were taken you will be contacted by phone or by letter within the next 1-3 weeks. Call [phone]  if you have not heard about the biopsies in 3 weeks.  ?Please also call with any specific questions about appointments or follow up tests. YOU HAD AN ENDOSCOPIC PROCEDURE TODAY: Refer to the procedure report and other information in the discharge instructions given to you for any specific questions about what was found during the examination. If this information does not answer your questions, please call [facility] office at [phone] to clarify.  ? ?YOU SHOULD EXPECT: Some feelings of bloating in the abdomen. Passage of more gas than usual. Walking can help get rid of the air that was put into your GI tract during the procedure and reduce the bloating. If you had a lower endoscopy (such as a colonoscopy or flexible sigmoidoscopy) you may notice spotting of blood in your stool or on the toilet paper. Some abdominal soreness may be present for a day or two, also. ? ?DIET: Soft diet (mashed potatoes, puddings, rice, finely chopped beef/chicken/turkey) until further notice ? ?ACTIVITY: Your care partner should take you home directly after the procedure. You should plan to take  it easy, moving slowly for the rest of the day. You can resume normal activity the day after the procedure however YOU SHOULD NOT DRIVE, use power tools, machinery or perform tasks that involve climbing or major physical exertion for 24 hours (because of the sedation medicines used during the test).  ? ?SYMPTOMS TO REPORT IMMEDIATELY: ?A gastroenterologist can be reached at any hour.  Please call [phone]  for any of the following symptoms:  ?Following lower endoscopy (colonoscopy, flexible sigmoidoscopy) ?Excessive amounts of blood in the stool  ?Significant tenderness, worsening of abdominal pains  ?Swelling of the abdomen that is new, acute  ?Fever of 100? or higher  ?Following upper endoscopy (EGD, EUS, ERCP, esophageal dilation) ?Vomiting of blood or coffee ground material  ?New, significant abdominal pain  ?New, significant chest pain or pain under the shoulder blades  ?Painful or persistently difficult swallowing  ?New shortness of breath  ?Black, tarry-looking or red, bloody stools ? ?FOLLOW UP:  ?If any biopsies were taken you will be contacted by phone or by letter within the next 1-3 weeks. Call [phone]  if you have not heard about the biopsies in 3 weeks.  ?Please also call with any specific questions about appointments or follow up tests.  ?

## 2021-11-16 NOTE — Transfer of Care (Signed)
Immediate Anesthesia Transfer of Care Note ? ?Patient: Lee Smith ? ?Procedure(s) Performed: Procedure(s): ?ESOPHAGOGASTRODUODENOSCOPY (EGD) (N/A) ?IMPACTION REMOVAL ? ?Patient Location: PACU ? ?Anesthesia Type:General ? ?Level of Consciousness: Alert, Awake, Oriented ? ?Airway & Oxygen Therapy: Patient Spontanous Breathing ? ?Post-op Assessment: Report given to RN ? ?Post vital signs: Reviewed and stable ? ?Last Vitals:  ?Vitals:  ? 11/16/21 2239 11/16/21 2335  ?BP: (!) 154/89 138/78  ?Pulse: 73 73  ?Resp: 20 16  ?Temp: 36.8 ?C 36.4 ?C  ?SpO2: 99%   ? ? ?Complications: No apparent anesthesia complications ? ?

## 2021-11-16 NOTE — Anesthesia Preprocedure Evaluation (Signed)
Anesthesia Evaluation  ? ?Patient awake ? ? ? ?Reviewed: ?Allergy & Precautions, NPO status , Patient's Chart, lab work & pertinent test results ? ?History of Anesthesia Complications ?Negative for: history of anesthetic complications ? ?Airway ?Mallampati: II ? ?TM Distance: >3 FB ?Neck ROM: Full ? ? ? Dental ?no notable dental hx. ?(+) Dental Advisory Given ?  ?Pulmonary ?neg pulmonary ROS, former smoker,  ?  ?Pulmonary exam normal ? ? ? ? ? ? ? Cardiovascular ?hypertension, negative cardio ROS ?Normal cardiovascular exam ? ? ?  ?Neuro/Psych ?negative neurological ROS ?   ? GI/Hepatic ?negative GI ROS, Neg liver ROS,   ?Endo/Other  ?negative endocrine ROS ? Renal/GU ?Renal disease  ? ?  ?Musculoskeletal ?negative musculoskeletal ROS ?(+)  ? Abdominal ?  ?Peds ? Hematology ?negative hematology ROS ?(+)   ?Anesthesia Other Findings ? ? Reproductive/Obstetrics ? ?  ? ? ? ? ? ? ? ? ? ? ? ? ? ?  ?  ? ? ? ? ? ? ? ? ?Anesthesia Physical ?Anesthesia Plan ? ?ASA: 2 and emergent ? ?Anesthesia Plan: General  ? ?Post-op Pain Management: Minimal or no pain anticipated  ? ?Induction: Intravenous, Rapid sequence and Cricoid pressure planned ? ?PONV Risk Score and Plan: 2 and Ondansetron and Dexamethasone ? ?Airway Management Planned: Oral ETT ? ?Additional Equipment:  ? ?Intra-op Plan:  ? ?Post-operative Plan: Extubation in OR ? ?Informed Consent: I have reviewed the patients History and Physical, chart, labs and discussed the procedure including the risks, benefits and alternatives for the proposed anesthesia with the patient or authorized representative who has indicated his/her understanding and acceptance.  ? ? ? ?Dental advisory given ? ?Plan Discussed with: CRNA and Anesthesiologist ? ?Anesthesia Plan Comments:   ? ? ? ? ? ? ?Anesthesia Quick Evaluation ? ?

## 2021-11-16 NOTE — ED Triage Notes (Signed)
Pt arrives POV with a piece of pork chop stuck in his esophagus from eating dinner earlier this evening. ? ?He is not able to swallow saliva.  Nothing will go past the blockage. ? ?States this has happened to him before. ? ?He denies any breathing problems. ? ?Pt ambulatory to triage, in NAD. ?

## 2021-11-16 NOTE — H&P (Signed)
[facility] H/P Note ? ?Chief Complaint: food in throat ? ?HPI: Lee Smith is an 76 y.o. male.  20+ years dysphagia (esophagram years ago negative, no prior egd) with sensation of pork chop stuck in mid esophagus after dinner.   ? ?Past Medical History:  ?Diagnosis Date  ? Allergic rhinitis   ? Arthritis   ? bilateral knees; has planned partial knee replacement next month  ? Genital warts   ? History of kidney stones 11/16/2013  ? lithotripsy for right ureteral stone  ? Hx of colonic polyps   ? 2008  ? Hyperlipidemia   ? Hypertension   ? Recurrent upper respiratory infection (URI)   ? Right nephrolithiasis   ? ? ?Past Surgical History:  ?Procedure Laterality Date  ? ADENOIDECTOMY    ? CHOLECYSTECTOMY  07/19/2000  ? CYSTOSCOPY W/ URETERAL STENT PLACEMENT Right 02/12/2014  ? Procedure: CYSTOSCOPY WITH RETROGRADE PYELOGRAM/URETERAL STENT PLACEMENT;  Surgeon: Festus Aloe, MD;  Location: [facility];  Service: Urology;  Laterality: Right;  ? CYSTOSCOPY WITH RETROGRADE PYELOGRAM, URETEROSCOPY AND STENT PLACEMENT Right 02/12/2014  ? Procedure: RIGHT URETEROSCOPY ;  Surgeon: Festus Aloe, MD;  Location: [facility];  Service: Urology;  Laterality: Right;  ? CYSTOSCOPY WITH URETEROSCOPY AND STENT PLACEMENT Right 03/05/2014  ? Procedure: CYSTOSCOPY WITH URETEROSCOPY AND STENT EXCHANGE, STONE EXTRACTION;  Surgeon: Festus Aloe, MD;  Location: [facility];  Service: Urology;  Laterality: Right;  ? CYSTOSCOPY WITH URETEROSCOPY AND STENT PLACEMENT Right 03/26/2014  ? Procedure: CYSTO WITH RIGHT URETEROSCOPY/STENT PLACEMENT;  Surgeon: Festus Aloe, MD;  Location: [facility];  Service: Urology;  Laterality: Right;  ? EXTRACORPOREAL SHOCK WAVE LITHOTRIPSY Right 12/17/2013  ? HOLMIUM LASER APPLICATION Right 10/93/2355  ? Procedure: HOLMIUM LASER APPLICATION;  Surgeon: Festus Aloe, MD;  Location: [facility];  Service:  Urology;  Laterality: Right;  ? INGUINAL HERNIA REPAIR Bilateral right 2003/   left 2004  ? STONE EXTRACTION WITH BASKET Right 03/26/2014  ? Procedure: STONE EXTRACTION WITH BASKET;  Surgeon: Festus Aloe, MD;  Location: [facility];  Service: Urology;  Laterality: Right;  ? TONSILLECTOMY    ? 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 UMBILICAL HERNIA REPAIR  07/19/1998  ? ? ?Medications Prior to Admission  ?Medication Sig Dispense Refill  ? acetaminophen (TYLENOL) 500 MG tablet Take 500 mg by mouth every 6 (six) hours as needed.    ? albuterol (VENTOLIN HFA) 108 (90 Base) MCG/ACT inhaler Inhale 2 puffs into the lungs every 6 (six) hours as needed for wheezing or shortness of breath. 1 each 0  ? amLODipine (NORVASC) 5 MG tablet Take 1 tablet (5 mg total) by mouth daily. 90 tablet 3  ? atorvastatin (LIPITOR) 20 MG tablet Take 1 tablet (20 mg total) by mouth daily. 90 tablet 3  ? Cholecalciferol (D3-50 PO) Take by mouth.    ? Dermatological Products, Misc. [facility]) lotion Apply 1-2 times per day as a moisturizer. 225 g 5  ? fluticasone (FLONASE) 50 MCG/ACT nasal spray Place into both nostrils daily.    ? hydrOXYzine (ATARAX) 25 MG tablet Take 1/2 tablet to 1 tablet every 8 hours as needed for itching. 90 tablet 2  ? losartan (COZAAR) 100 MG tablet Take 1 tablet (100 mg total) by mouth daily. 90 tablet 3  ? meloxicam (MOBIC) 15 MG tablet Take 1 tablet (15 mg total) by mouth daily. 90 tablet 1  ? Naproxen Sodium 220 MG CAPS Take by  mouth.    ? tamsulosin (FLOMAX) 0.4 MG CAPS capsule Take 0.4 mg by mouth.    ? triamcinolone cream (KENALOG) 0.1 % Apply 1 application topically 2 (two) times daily. Apply to affected areas bid as needed. 453 g 0  ? vitamin B-12 (CYANOCOBALAMIN) 500 MCG tablet Take 2 tablets by mouth daily.    ? tadalafil (CIALIS) 20 MG tablet tadalafil 20 mg tablet ? Take 1 tablet every day by oral route.    ? ? ?Allergies: No Known Allergies ? ?Family History  ?Problem Relation Age of  Onset  ? Hypertension Mother   ? Cancer Father   ?     prostate  ? Cancer Sister   ?     breast  ? Arthritis Other   ?     family hx  ? Hyperlipidemia Other   ?     family hx  ? Hypertension Other   ?     family hx  ? ? ?Social History:  reports that he quit smoking about 51 years ago. His smoking use included cigarettes. He has a 6.00 pack-year smoking history. He has quit using smokeless tobacco.  His smokeless tobacco use included chew. He reports current alcohol use of about 7.0 standard drinks per week. He reports that he does not use drugs. ? ? ?ROS: As per HPI, all others negative ? ?Blood pressure (!) 154/89, pulse 73, temperature 98.2 ?F (36.8 ?C), temperature source Oral, resp. rate 20, height '5\' 11"'$  (1.803 m), weight 33.1 kg, SpO2 99 %. ?General appearance: NAD ?HEENT:  [place]/AT, anicteric ?CV: Regular ?LUNGS:  Clear ?ABD:  Soft, non-tender ?NEURO:  Non encephalopathic; A/O ? ?No results found for this or any previous visit (from the past 48 hour(s)). ?No results found. ? ?Assessment/Plan ? ? Possible esophageal food impaction ?Endoscopy for possible foreign body (food) removal. ?Risks (bleeding, infection, bowel perforation that could require surgery, sedation-related changes in cardiopulmonary systems), benefits (identification and possible treatment of source of symptoms, exclusion of certain causes of symptoms), and alternatives (watchful waiting, radiographic imaging studies, empiric medical treatment) of upper endoscopy (EGD) were explained to patient/family in detail and patient wishes to proceed.  ? ?Landry Dyke ?11/16/2021, 11:04 PM  ?

## 2021-11-16 NOTE — ED Provider Notes (Signed)
?[facility] EMER[facility] DEPT ?Provider Note ? ? ?CSN: [id number] ?Arrival date & time: 11/16/21  2002 ? ?  ? ?History ? ?Chief Complaint  ?Patient presents with  ? Swallowed Foreign Body  ? ? ?Lee Smith is a 76 y.o. male presenting emergency department with foreign body in his throat.  Patient reports that he got a piece of pork chop stuck in his mid esophagus by feeling earlier this evening with dinner.  He has not been able to tolerate any fluids or swallow his saliva since then.  He says he has had food stuck in his throat before but usually goes down at home, and has never needed an endoscopy or emergency room visit for it.  This 1 is not going down however.  He follows with [facility] Dr Paulita Fujita ? ?HPI ? ?  ? ?Home Medications ?Prior to Admission medications   ?Medication Sig Start Date End Date Taking? Authorizing Provider  ?acetaminophen (TYLENOL) 500 MG tablet Take 500 mg by mouth every 6 (six) hours as needed.    [provider]  ?albuterol (VENTOLIN HFA) 108 (90 Base) MCG/ACT inhaler Inhale 2 puffs into the lungs every 6 (six) hours as needed for wheezing or shortness of breath. 01/07/21   Burchette, Alinda Sierras, MD  ?amLODipine (NORVASC) 5 MG tablet Take 1 tablet (5 mg total) by mouth daily. 11/11/21   Burchette, Alinda Sierras, MD  ?atorvastatin (LIPITOR) 20 MG tablet Take 1 tablet (20 mg total) by mouth daily. 11/11/21   Burchette, Alinda Sierras, MD  ?Cholecalciferol (D3-50 PO) Take by mouth.    [provider]  ?Dermatological Products, Misc. [facility]) lotion Apply 1-2 times per day as a moisturizer. 11/03/21   Garnet Sierras, DO  ?fluticasone (FLONASE) 50 MCG/ACT nasal spray Place into both nostrils daily.    [provider]  ?hydrOXYzine (ATARAX) 25 MG tablet Take 1/2 tablet to 1 tablet every 8 hours as needed for itching. 06/25/21   Garnet Sierras, DO  ?losartan (COZAAR) 100 MG tablet Take 1 tablet (100 mg total) by mouth daily. 11/11/21   Burchette, Alinda Sierras, MD  ?meloxicam (MOBIC) 15  MG tablet Take 1 tablet (15 mg total) by mouth daily. 11/15/18   Burchette, Alinda Sierras, MD  ?Naproxen Sodium 220 MG CAPS Take by mouth.    [provider]  ?tadalafil (CIALIS) 20 MG tablet tadalafil 20 mg tablet ? Take 1 tablet every day by oral route.    [provider]  ?tamsulosin (FLOMAX) 0.4 MG CAPS capsule Take 0.4 mg by mouth.    [provider]  ?triamcinolone cream (KENALOG) 0.1 % Apply 1 application topically 2 (two) times daily. Apply to affected areas bid as needed. 03/17/21   Burchette, Alinda Sierras, MD  ?vitamin B-12 (CYANOCOBALAMIN) 500 MCG tablet Take 2 tablets by mouth daily. 11/22/20   [provider]  ?   ? ?Allergies    ?Patient has no known allergies.   ? ?Review of Systems   ?Review of Systems ? ?Physical Exam ?Updated Vital Signs ?BP 131/77 (BP Location: Left Arm)   Pulse 70   Temp 98.1 ?F (36.7 ?C)   Resp 18   Ht '5\' 11"'$  (1.803 m)   Wt 33.1 kg   SpO2 99%   BMI 10.18 kg/m?  ?Physical Exam ?Constitutional:   ?   General: He is not in acute distress. ?HENT:  ?   Head: Normocephalic and atraumatic.  ?   Comments: Patient is not able to swallow,  gagging and belching ?No visible food bolus in the posterior oropharynx ?Eyes:  ?   Conjunctiva/sclera: Conjunctivae normal.  ?   Pupils: Pupils are equal, round, and reactive to light.  ?Cardiovascular:  ?   Rate and Rhythm: Normal rate and regular rhythm.  ?Pulmonary:  ?   Effort: Pulmonary effort is normal. No respiratory distress.  ?Abdominal:  ?   General: There is no distension.  ?   Tenderness: There is no abdominal tenderness.  ?Skin: ?   General: Skin is warm and dry.  ?Neurological:  ?   General: No focal deficit present.  ?   Mental Status: He is alert. Mental status is at baseline.  ?Psychiatric:     ?   Mood and Affect: Mood normal.     ?   Behavior: Behavior normal.  ? ? ?ED Results / Procedures / Treatments   ?Labs ?(all labs ordered are listed, but only abnormal results are displayed) ?Labs Reviewed - No data  to display ? ?EKG ?None ? ?Radiology ?No results found. ? ?Procedures ?Procedures  ? ? ?Medications Ordered in ED ?Medications  ?LORazepam (ATIVAN) injection 1 mg (1 mg Intravenous Given 11/16/21 2046)  ?glucagon (human recombinant) (GLUCAGEN) injection 1 mg (1 mg Intravenous Given 11/16/21 2046)  ? ? ?ED Course/ Medical Decision Making/ A&P ?Clinical Course as of 11/16/21 2153  ?Mon Nov 16, 2021  ?2112 Dr Paulita Fujita [MT]  ?2144 GI team advised transfer to [facility] for endoscopy.  Patient's GF will drive him. [MT]  ?  ?Clinical Course User Index ?[MT] Wyvonnia Dusky, MD  ? ?                        ?Medical Decision Making ?Risk ?Prescription drug management. ? ? ?Patient is here with suspected food bolus in the esophagus, airways patent and intact.  Ativan and glucagon were given with no relief of his symptoms.  He is not able to tolerate secretions.  GI was consulted by phone and recommended transfer to main hospital emergency department for likely endoscopy.  Patient's wife will be taking him by car as the fastest means of arriving in the ED.  I do think he is safe for travel like this.  They verbalized understanding and agreement with the plan ? ?[facility] team asks that they be contacted on the patient's arrival in the entrance of the [facility] ED to take the patient directly to endoscopy.  Please call Jaci Carrel, GI RN, [phone]. ? ? ? ? ? ? ? ?Final Clinical Impression(s) / ED Diagnoses ?Final diagnoses:  ?Food impaction of esophagus, initial encounter  ? ? ?Rx / DC Orders ?ED Discharge Orders   ? ? None  ? ?  ? ? ?  ?Wyvonnia Dusky, MD ?11/16/21 2153 ? ?

## 2021-11-16 NOTE — ED Notes (Signed)
Pt Lee Smith in bed, spitting saliva into emesis bag. A/ox4, speaking in full and ccomplete sentences. Pt states he has some pork chop stuck in throat. Pt states this has happened before, but typically it will pass on its own in 15-20 min. -endoscopy or ED TX before. Denies SOB ?

## 2021-11-16 NOTE — Anesthesia Procedure Notes (Signed)
Procedure Name: Intubation ?Date/Time: 11/16/2021 11:08 PM ?Performed by: Gerald Leitz, CRNA ?Pre-anesthesia Checklist: Patient identified, Patient being monitored, Timeout performed, Emergency Drugs available and Suction available ?Patient Re-evaluated:Patient Re-evaluated prior to induction ?Oxygen Delivery Method: Circle system utilized ?Preoxygenation: Pre-oxygenation with 100% oxygen ?Induction Type: IV induction ?Ventilation: Mask ventilation without difficulty ?Laryngoscope Size: Mac and 3 ?Grade View: Grade I ?Tube type: Oral ?Tube size: 7.5 mm ?Number of attempts: 1 ?Airway Equipment and Method: Stylet ?Placement Confirmation: ETT inserted through vocal cords under direct vision, positive ETCO2 and breath sounds checked- equal and bilateral ?Secured at: 23 cm ?Tube secured with: Tape ?Dental Injury: Teeth and Oropharynx as per pre-operative assessment  ? ? ? ? ?

## 2021-11-17 ENCOUNTER — Encounter (HOSPITAL_COMMUNITY): Payer: Self-pay | Admitting: Gastroenterology

## 2021-11-17 NOTE — Anesthesia Postprocedure Evaluation (Signed)
Anesthesia Post Note ? ?Patient: LUGENE BEOUGHER ? ?Procedure(s) Performed: ESOPHAGOGASTRODUODENOSCOPY (EGD) ?IMPACTION REMOVAL ? ?  ? ?Patient location during evaluation: PACU ?Anesthesia Type: General ?Level of consciousness: sedated ?Pain management: pain level controlled ?Vital Signs Assessment: post-procedure vital signs reviewed and stable ?Respiratory status: spontaneous breathing and respiratory function stable ?Cardiovascular status: stable ?Postop Assessment: no apparent nausea or vomiting ?Anesthetic complications: no ? ? ?No notable events documented. ? ?Last Vitals:  ?Vitals:  ? 11/16/21 2345 11/16/21 2354  ?BP: 127/74 136/74  ?Pulse: 76 77  ?Resp: 20 16  ?Temp:  (!) 36.4 ?C  ?SpO2: 97% 97%  ?  ?Last Pain:  ?Vitals:  ? 11/16/21 2354  ?TempSrc:   ?PainSc: 0-No pain  ? ? ?  ?  ?  ?  ?  ?  ? ?Dunia Pringle DANIEL ? ? ? ? ?

## 2021-11-17 NOTE — Op Note (Addendum)
[facility] ?Patient Name: Lee Smith ?Procedure Date: 11/16/2021 ?MRN: [id number] ?Attending MD: Arta Silence , MD ?Date of Birth: 1946-07-07 ?CSN: [id number] ?Age: 76 ?Admit Type: Outpatient ?Procedure:                Upper GI endoscopy ?Indications:              Foreign body in the esophagus ?Providers:                Arta Silence, MD, Jaci Carrel, RN, Elberta Fortis  ?                          Johnella Moloney, Technician ?Referring MD:              ?Medicines:                General Anesthesia ?Complications:            No immediate complications. ?Estimated Blood Loss:     Estimated blood loss: none. ?Procedure:                Pre-Anesthesia Assessment: ?                          - Prior to the procedure, a History and Physical  ?                          was performed, and patient medications and  ?                          allergies were reviewed. The patient's tolerance of  ?                          previous anesthesia was also reviewed. The risks  ?                          and benefits of the procedure and the sedation  ?                          options and risks were discussed with the patient.  ?                          All questions were answered, and informed consent  ?                          was obtained. Prior Anticoagulants: The patient has  ?                          taken no previous anticoagulant or antiplatelet  ?                          agents except for aspirin. ASA Grade Assessment: II  ?                          - A patient with mild systemic disease. After  ?  reviewing the risks and benefits, the patient was  ?                          deemed in satisfactory condition to undergo the  ?                          procedure. ?                          After obtaining informed consent, the endoscope was  ?                          passed under direct vision. Throughout the  ?                          procedure, the patient's blood pressure, pulse, and  ?                           oxygen saturations were monitored continuously. The  ?                          GIF-H190 ([id number]) Olympus endoscope was introduced  ?                          through the mouth, and advanced to the second part  ?                          of duodenum. The upper GI endoscopy was  ?                          accomplished without difficulty. The patient  ?                          tolerated the procedure well. ?Scope In: ?Scope Out: ?Findings: ?     Food was found in the proximal esophagus, 1-2 cm distal to UES. Removal  ?     of food was accomplished. Initially seemed patient had inflammation  ?     (hard to rule out stricture) and Barrett's extending to proximal  ?     esophagus; however, after successful removal of food, it was apparent  ?     that patient has proximal gastric inflet patch without Barrett's, and  ?     normal esophageal mucosal otherwise. ?     LA Grade B (one or more mucosal breaks greater than 5 mm, not extending  ?     between the tops of two mucosal folds) esophagitis was found. ?     The exam of the esophagus was otherwise normal. ?     The entire examined stomach was normal. ?     The duodenal bulb, first portion of the duodenum and second portion of  ?     the duodenum were normal. ?Impression:               - Food in the proximal esophagus. Removal was  ?  successful. ?                          - Possible proximal esophageal stricture (hard to  ?                          tell given inflammation). ?                          - LA Grade B esophagitis. ?                          - Normal stomach. ?                          - Normal duodenal bulb, first portion of the  ?                          duodenum and second portion of the duodenum. ?Moderate Sedation: ?     None ?Recommendation:           - Discharge patient to home (ambulatory). ?                          - Soft diet until further notice. ?                          - Use Prilosec  (omeprazole) 20 mg PO BID until  ?                          further notice. ?                          - Continue present medications. ?                          - Return to GI clinic in 4 weeks. Repeat endoscopy  ?                          within the next several weeks. ?                          - Return to referring physician as previously  ?                          scheduled. ?Procedure Code(s):        --- Professional --- ?                          [phone], Esophagogastroduodenoscopy, flexible,  ?                          transoral; with removal of foreign body(s) ?Diagnosis Code(s):        --- Professional --- ?                          Q33.007M, Food in esophagus causing other injury,  ?  initial encounter ?                          K20.90, Esophagitis, unspecified without bleeding ?                          T18.108A, Unspecified foreign body in esophagus  ?                          causing other injury, initial encounter ?CPT copyright 2019 [employer]. All rights reserved. ?The codes documented in this report are preliminary and upon coder review may  ?be revised to meet current compliance requirements. ?Arta Silence, MD ?11/16/2021 11:37:30 PM ?This report has been signed electronically. ?Number of Addenda: 0 ?

## 2021-12-06 ENCOUNTER — Encounter: Payer: Self-pay | Admitting: Family Medicine

## 2021-12-07 DIAGNOSIS — K222 Esophageal obstruction: Secondary | ICD-10-CM | POA: Diagnosis not present

## 2021-12-07 DIAGNOSIS — T18108A Unspecified foreign body in esophagus causing other injury, initial encounter: Secondary | ICD-10-CM | POA: Diagnosis not present

## 2021-12-07 DIAGNOSIS — T18128A Food in esophagus causing other injury, initial encounter: Secondary | ICD-10-CM | POA: Diagnosis not present

## 2021-12-07 DIAGNOSIS — Z86010 Personal history of colonic polyps: Secondary | ICD-10-CM | POA: Diagnosis not present

## 2021-12-07 DIAGNOSIS — T18128S Food in esophagus causing other injury, sequela: Secondary | ICD-10-CM | POA: Diagnosis not present

## 2021-12-07 DIAGNOSIS — R1310 Dysphagia, unspecified: Secondary | ICD-10-CM | POA: Diagnosis not present

## 2021-12-07 DIAGNOSIS — K209 Esophagitis, unspecified without bleeding: Secondary | ICD-10-CM | POA: Diagnosis not present

## 2021-12-07 LAB — HM COLONOSCOPY

## 2021-12-07 MED ORDER — COLCHICINE 0.6 MG PO TABS: ORAL_TABLET | ORAL | 1 refills | Status: DC

## 2021-12-15 ENCOUNTER — Encounter: Payer: Self-pay | Admitting: Family Medicine

## 2021-12-16 ENCOUNTER — Encounter: Payer: Self-pay | Admitting: Family Medicine

## 2021-12-18 DIAGNOSIS — D124 Benign neoplasm of descending colon: Secondary | ICD-10-CM | POA: Diagnosis not present

## 2021-12-18 DIAGNOSIS — K222 Esophageal obstruction: Secondary | ICD-10-CM | POA: Diagnosis not present

## 2021-12-18 DIAGNOSIS — Z09 Encounter for follow-up examination after completed treatment for conditions other than malignant neoplasm: Secondary | ICD-10-CM | POA: Diagnosis not present

## 2021-12-18 DIAGNOSIS — Z86010 Personal history of colonic polyps: Secondary | ICD-10-CM | POA: Diagnosis not present

## 2021-12-18 DIAGNOSIS — K449 Diaphragmatic hernia without obstruction or gangrene: Secondary | ICD-10-CM | POA: Diagnosis not present

## 2021-12-18 DIAGNOSIS — K649 Unspecified hemorrhoids: Secondary | ICD-10-CM | POA: Diagnosis not present

## 2021-12-18 DIAGNOSIS — R1310 Dysphagia, unspecified: Secondary | ICD-10-CM | POA: Diagnosis not present

## 2021-12-18 DIAGNOSIS — D123 Benign neoplasm of transverse colon: Secondary | ICD-10-CM | POA: Diagnosis not present

## 2021-12-18 DIAGNOSIS — D120 Benign neoplasm of cecum: Secondary | ICD-10-CM | POA: Diagnosis not present

## 2021-12-18 DIAGNOSIS — D122 Benign neoplasm of ascending colon: Secondary | ICD-10-CM | POA: Diagnosis not present

## 2021-12-23 DIAGNOSIS — D123 Benign neoplasm of transverse colon: Secondary | ICD-10-CM | POA: Diagnosis not present

## 2021-12-23 DIAGNOSIS — D122 Benign neoplasm of ascending colon: Secondary | ICD-10-CM | POA: Diagnosis not present

## 2021-12-23 DIAGNOSIS — D120 Benign neoplasm of cecum: Secondary | ICD-10-CM | POA: Diagnosis not present

## 2021-12-23 DIAGNOSIS — D124 Benign neoplasm of descending colon: Secondary | ICD-10-CM | POA: Diagnosis not present

## 2022-04-20 ENCOUNTER — Other Ambulatory Visit: Payer: Self-pay | Admitting: Allergy

## 2022-04-22 ENCOUNTER — Ambulatory Visit (INDEPENDENT_AMBULATORY_CARE_PROVIDER_SITE_OTHER): Payer: Medicare Other

## 2022-04-22 DIAGNOSIS — Z23 Encounter for immunization: Secondary | ICD-10-CM | POA: Diagnosis not present

## 2022-05-19 DIAGNOSIS — Z471 Aftercare following joint replacement surgery: Secondary | ICD-10-CM | POA: Diagnosis not present

## 2022-05-19 DIAGNOSIS — Z96653 Presence of artificial knee joint, bilateral: Secondary | ICD-10-CM | POA: Diagnosis not present

## 2022-05-19 DIAGNOSIS — M7061 Trochanteric bursitis, right hip: Secondary | ICD-10-CM | POA: Diagnosis not present

## 2022-05-19 DIAGNOSIS — M25551 Pain in right hip: Secondary | ICD-10-CM | POA: Diagnosis not present

## 2022-05-19 DIAGNOSIS — M1611 Unilateral primary osteoarthritis, right hip: Secondary | ICD-10-CM | POA: Diagnosis not present

## 2022-06-17 DIAGNOSIS — N402 Nodular prostate without lower urinary tract symptoms: Secondary | ICD-10-CM | POA: Diagnosis not present

## 2022-06-20 ENCOUNTER — Encounter: Payer: Self-pay | Admitting: Family Medicine

## 2022-06-24 DIAGNOSIS — R350 Frequency of micturition: Secondary | ICD-10-CM | POA: Diagnosis not present

## 2022-06-24 DIAGNOSIS — N200 Calculus of kidney: Secondary | ICD-10-CM | POA: Diagnosis not present

## 2022-06-24 DIAGNOSIS — N402 Nodular prostate without lower urinary tract symptoms: Secondary | ICD-10-CM | POA: Diagnosis not present

## 2022-06-24 DIAGNOSIS — N401 Enlarged prostate with lower urinary tract symptoms: Secondary | ICD-10-CM | POA: Diagnosis not present

## 2022-07-06 DIAGNOSIS — L814 Other melanin hyperpigmentation: Secondary | ICD-10-CM | POA: Diagnosis not present

## 2022-07-06 DIAGNOSIS — L821 Other seborrheic keratosis: Secondary | ICD-10-CM | POA: Diagnosis not present

## 2022-07-06 DIAGNOSIS — L570 Actinic keratosis: Secondary | ICD-10-CM | POA: Diagnosis not present

## 2022-07-06 DIAGNOSIS — D492 Neoplasm of unspecified behavior of bone, soft tissue, and skin: Secondary | ICD-10-CM | POA: Diagnosis not present

## 2022-07-06 DIAGNOSIS — D225 Melanocytic nevi of trunk: Secondary | ICD-10-CM | POA: Diagnosis not present

## 2022-07-27 ENCOUNTER — Encounter: Payer: Self-pay | Admitting: Family Medicine

## 2022-11-03 ENCOUNTER — Telehealth: Payer: Self-pay | Admitting: Family Medicine

## 2022-11-03 DIAGNOSIS — N200 Calculus of kidney: Secondary | ICD-10-CM | POA: Diagnosis not present

## 2022-11-03 DIAGNOSIS — N401 Enlarged prostate with lower urinary tract symptoms: Secondary | ICD-10-CM | POA: Diagnosis not present

## 2022-11-03 DIAGNOSIS — N281 Cyst of kidney, acquired: Secondary | ICD-10-CM | POA: Diagnosis not present

## 2022-11-03 DIAGNOSIS — R350 Frequency of micturition: Secondary | ICD-10-CM | POA: Diagnosis not present

## 2022-11-03 NOTE — Telephone Encounter (Signed)
Contacted Lee Smith to schedule their annual wellness visit. Patient declined to schedule AWV at this time.  DO NOT CALL  Lee Smith AWV direct phone # [phone]  Spoke with patient   he declined AWV  will see pcp

## 2022-11-07 ENCOUNTER — Other Ambulatory Visit: Payer: Self-pay | Admitting: Family Medicine

## 2022-11-22 ENCOUNTER — Other Ambulatory Visit: Payer: Self-pay | Admitting: Family Medicine

## 2022-11-23 ENCOUNTER — Encounter: Payer: Self-pay | Admitting: Family Medicine

## 2022-11-23 MED ORDER — LOSARTAN POTASSIUM 100 MG PO TABS: 100.0000 mg | ORAL_TABLET | Freq: Every day | ORAL | 0 refills | Status: DC

## 2022-12-12 ENCOUNTER — Other Ambulatory Visit: Payer: Self-pay | Admitting: Family Medicine

## 2022-12-14 ENCOUNTER — Encounter: Payer: Self-pay | Admitting: Family Medicine

## 2022-12-28 DIAGNOSIS — Z09 Encounter for follow-up examination after completed treatment for conditions other than malignant neoplasm: Secondary | ICD-10-CM | POA: Diagnosis not present

## 2022-12-28 DIAGNOSIS — Z86010 Personal history of colonic polyps: Secondary | ICD-10-CM | POA: Diagnosis not present

## 2022-12-28 DIAGNOSIS — D122 Benign neoplasm of ascending colon: Secondary | ICD-10-CM | POA: Diagnosis not present

## 2022-12-28 DIAGNOSIS — K5730 Diverticulosis of large intestine without perforation or abscess without bleeding: Secondary | ICD-10-CM | POA: Diagnosis not present

## 2022-12-28 DIAGNOSIS — D123 Benign neoplasm of transverse colon: Secondary | ICD-10-CM | POA: Diagnosis not present

## 2022-12-28 DIAGNOSIS — K648 Other hemorrhoids: Secondary | ICD-10-CM | POA: Diagnosis not present

## 2022-12-30 DIAGNOSIS — D122 Benign neoplasm of ascending colon: Secondary | ICD-10-CM | POA: Diagnosis not present

## 2022-12-30 DIAGNOSIS — D123 Benign neoplasm of transverse colon: Secondary | ICD-10-CM | POA: Diagnosis not present

## 2023-01-04 ENCOUNTER — Ambulatory Visit (INDEPENDENT_AMBULATORY_CARE_PROVIDER_SITE_OTHER): Payer: Medicare Other | Admitting: Family Medicine

## 2023-01-04 VITALS — BP 110/66 | HR 65 | Temp 97.7°F | Ht 70.87 in | Wt 234.7 lb

## 2023-01-04 DIAGNOSIS — E785 Hyperlipidemia, unspecified: Secondary | ICD-10-CM | POA: Diagnosis not present

## 2023-01-04 DIAGNOSIS — I10 Essential (primary) hypertension: Secondary | ICD-10-CM | POA: Diagnosis not present

## 2023-01-04 DIAGNOSIS — R011 Cardiac murmur, unspecified: Secondary | ICD-10-CM | POA: Diagnosis not present

## 2023-01-04 DIAGNOSIS — Z0000 Encounter for general adult medical examination without abnormal findings: Secondary | ICD-10-CM

## 2023-01-04 LAB — LIPID PANEL
Cholesterol: 129 mg/dL (ref 0–200)
HDL: 43.4 mg/dL (ref >39.00)
LDL Cholesterol: 70 mg/dL (ref 0–99)
NonHDL: 85.36
Total CHOL/HDL Ratio: 3
Triglycerides: 79 mg/dL (ref 0.0–149.0)
VLDL: 15.8 mg/dL (ref 0.0–40.0)

## 2023-01-04 LAB — COMPREHENSIVE METABOLIC PANEL
ALT: 18 U/L (ref 0–53)
AST: 21 U/L (ref 0–37)
Albumin: 4.1 g/dL (ref 3.5–5.2)
Alkaline Phosphatase: 63 U/L (ref 39–117)
BUN: 16 mg/dL (ref 6–23)
CO2: 26 mEq/L (ref 19–32)
Calcium: 9 mg/dL (ref 8.4–10.5)
Chloride: 105 mEq/L (ref 96–112)
Creatinine, Ser: 0.89 mg/dL (ref 0.40–1.50)
GFR: 82.44 mL/min (ref >60.00)
Glucose, Bld: 97 mg/dL (ref 70–99)
Potassium: 4 mEq/L (ref 3.5–5.1)
Sodium: 139 mEq/L (ref 135–145)
Total Bilirubin: 0.6 mg/dL (ref 0.2–1.2)
Total Protein: 6.9 g/dL (ref 6.0–8.3)

## 2023-01-04 LAB — CBC WITH DIFFERENTIAL/PLATELET
Basophils Absolute: 0 10*3/uL (ref 0.0–0.1)
Basophils Relative: 1 % (ref 0.0–3.0)
Eosinophils Absolute: 0.1 10*3/uL (ref 0.0–0.7)
Eosinophils Relative: 2.9 % (ref 0.0–5.0)
HCT: 42.1 % (ref 39.0–52.0)
Hemoglobin: 13.9 g/dL (ref 13.0–17.0)
Lymphocytes Relative: 29 % (ref 12.0–46.0)
Lymphs Abs: 1.4 10*3/uL (ref 0.7–4.0)
MCHC: 33 g/dL (ref 30.0–36.0)
MCV: 97.5 fl (ref 78.0–100.0)
Monocytes Absolute: 0.4 10*3/uL (ref 0.1–1.0)
Monocytes Relative: 9.5 % (ref 3.0–12.0)
Neutro Abs: 2.7 10*3/uL (ref 1.4–7.7)
Neutrophils Relative %: 57.6 % (ref 43.0–77.0)
Platelets: 218 10*3/uL (ref 150.0–400.0)
RBC: 4.31 Mil/uL (ref 4.22–5.81)
RDW: 13.3 % (ref 11.5–15.5)
WBC: 4.7 10*3/uL (ref 4.0–10.5)

## 2023-01-04 MED ORDER — LOSARTAN POTASSIUM 100 MG PO TABS: 100.0000 mg | ORAL_TABLET | Freq: Every day | ORAL | 3 refills | Status: DC

## 2023-01-04 MED ORDER — AMLODIPINE BESYLATE 5 MG PO TABS: 5.0000 mg | ORAL_TABLET | Freq: Every day | ORAL | 3 refills | Status: DC

## 2023-01-04 MED ORDER — ATORVASTATIN CALCIUM 20 MG PO TABS: 20.0000 mg | ORAL_TABLET | Freq: Every day | ORAL | 3 refills | Status: DC

## 2023-01-04 NOTE — Progress Notes (Signed)
Established Patient Office Visit  Subjective   Patient ID: Lee Smith, male    DOB: [birth year]  Age: 77 y.o. MRN: [id number]  Chief Complaint  Patient presents with   Medicare Wellness    HPI    Here for Medicare Annual Wellness Visit and medical follow-up.  He has history of hypertension, history of colon adenomas, hyperlipidemia.  Generally doing well.  Has done a lot of travel in the past few years and has a couple of trips planned.  Has had some difficulties losing weight.  Generally feels well.  Still goes to the [place] and states he had RSV vaccine there recently.  Remains on amlodipine 5 mg daily for hypertension, atorvastatin 20 mg daily for hyperlipidemia and losartan 100 mg daily.  Compliant with medications.  No recent dizziness.  Due for follow-up labs.  1.  Risk factors based on Past Medical , Social, and Family history reviewed and as indicated below with no changes  Past Medical History:  Diagnosis Date   Allergic rhinitis    Arthritis    bilateral knees; has planned partial knee replacement next month   Genital warts    History of kidney stones 11/16/2013   lithotripsy for right ureteral stone   Hx of colonic polyps    2008   Hyperlipidemia    Hypertension    Recurrent upper respiratory infection (URI)    Right nephrolithiasis      2.  Limitations in physical activities None.  No recent falls. Walks 2- 3 miles per day.  3.  Depression/mood No active depression or anxiety issues PHQ-2 =0.  4.  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  ADLs independent in all.  6.  Cognitive function (orientation to time and place, language, writing, speech,memory) no short or long term memory issues.  Language and judgement intact.  7.  Home Safety no issues  8.  Height, weight, and visual acuity.all stable. Wt Readings from Last 3 Encounters:  01/04/23 234 lb 11.2 oz (106.5 kg)  11/16/21 72 lb 15.6 oz (33.1 kg)  11/11/21 237 lb 12.8 oz  (107.9 kg)    9.  Counseling discussed -.Counseled regarding age and gender appropriate preventative screenings and immunizations.  10. Recommendation of preventive services.  Immunizations up to date.   Continue with annual flu vaccine.  11. Labs based on risk factors- lipid, CMP, CBC  12. Care Plan- as below.  13. Other Providers  Dr Mena Goes, Urology.  Dr Allena Katz, Neurology.  14. Written schedule of screening/prevention services given to patient. Health Maintenance  Topic Date Due   COVID-19 Vaccine (7 - 2023-24 season) 07/27/2022   INFLUENZA VACCINE  02/17/2023   Medicare Annual Wellness (AWV)  01/04/2024   DTaP/Tdap/Td (3 - Tdap) 12/04/2029   Pneumonia Vaccine 60+ Years old  Completed   Hepatitis C Screening  Completed   Zoster Vaccines- Shingrix  Completed   HPV VACCINES  Aged Out   Colonoscopy  Discontinued      Review of Systems  Constitutional:  Negative for chills, fever, malaise/fatigue and weight loss.  HENT:  Negative for hearing loss.   Eyes:  Negative for blurred vision and double vision.  Respiratory:  Negative for cough and shortness of breath.   Cardiovascular:  Negative for chest pain, palpitations and leg swelling.  Gastrointestinal:  Negative for abdominal pain, blood in stool, constipation and diarrhea.  Genitourinary:  Negative for dysuria.  Skin:  Negative for rash.  Neurological:  Negative  for dizziness, speech change, seizures, loss of consciousness and headaches.  Psychiatric/Behavioral:  Negative for depression.       Objective:     BP 110/66 (BP Location: Left Arm, Patient Position: Sitting, Cuff Size: Normal)   Pulse 65   Temp 97.7 F (36.5 C) (Oral)   Ht 5' 10.87" (1.8 m)   Wt 234 lb 11.2 oz (106.5 kg)   SpO2 98%   BMI 32.86 kg/m  BP Readings from Last 3 Encounters:  01/04/23 110/66  11/16/21 136/74  11/11/21 130/76   Wt Readings from Last 3 Encounters:  01/04/23 234 lb 11.2 oz (106.5 kg)  11/16/21 72 lb 15.6 oz (33.1 kg)   11/11/21 237 lb 12.8 oz (107.9 kg)      Physical Exam Vitals reviewed.  Constitutional:      General: He is not in acute distress.    Appearance: He is well-developed.  HENT:     Head: Normocephalic and atraumatic.     Right Ear: External ear normal.     Left Ear: External ear normal.  Eyes:     Conjunctiva/sclera: Conjunctivae normal.     Pupils: Pupils are equal, round, and reactive to light.  Neck:     Thyroid: No thyromegaly.  Cardiovascular:     Rate and Rhythm: Normal rate and regular rhythm.     Heart sounds: Murmur heard.     Comments: 2/6 systolic murmur heard best left sternal border.  Perhaps slightly worse with expiration Pulmonary:     Effort: No respiratory distress.     Breath sounds: No wheezing or rales.  Abdominal:     General: Bowel sounds are normal. There is no distension.     Palpations: Abdomen is soft. There is no mass.     Tenderness: There is no abdominal tenderness. There is no guarding or rebound.  Musculoskeletal:     Cervical back: Normal range of motion and neck supple.  Lymphadenopathy:     Cervical: No cervical adenopathy.  Skin:    Findings: No rash.  Neurological:     Mental Status: He is alert and oriented to person, place, and time.     Cranial Nerves: No cranial nerve deficit.     Deep Tendon Reflexes: Reflexes normal.      No results found for any visits on 01/04/23.  Last CBC Lab Results  Component Value Date   WBC 4.7 01/04/2023   HGB 13.9 01/04/2023   HCT 42.1 01/04/2023   MCV 97.5 01/04/2023   MCH 32.7 11/22/2013   RDW 13.3 01/04/2023   PLT 218.0 01/04/2023   Last metabolic panel Lab Results  Component Value Date   GLUCOSE 97 01/04/2023   NA 139 01/04/2023   K 4.0 01/04/2023   CL 105 01/04/2023   CO2 26 01/04/2023   BUN 16 01/04/2023   CREATININE 0.89 01/04/2023   GFRNONAA 75 (L) 11/22/2013   CALCIUM 9.0 01/04/2023   PROT 6.9 01/04/2023   ALBUMIN 4.1 01/04/2023   BILITOT 0.6 01/04/2023   ALKPHOS 63  01/04/2023   AST 21 01/04/2023   ALT 18 01/04/2023   Last lipids Lab Results  Component Value Date   CHOL 129 01/04/2023   HDL 43.40 01/04/2023   LDLCALC 70 01/04/2023   LDLDIRECT 166.7 04/23/2013   TRIG 79.0 01/04/2023   CHOLHDL 3 01/04/2023      The ASCVD Risk score (Arnett DK, et al., 2019) failed to calculate for the following reasons:   Unable to determine if patient  is Non-Hispanic African American    Assessment & Plan:   #1 Medicare subsequent annual wellness visit.  Patient goes to the [place] yearly and gets most of his vaccines there.  Recommend yearly flu vaccine.  Other vaccines up-to-date.  Would not likely need further colonoscopies.  #2 hypertension stable and well-controlled.  Continue amlodipine and losartan with refills provided.  #3 hyperlipidemia treated with atorvastatin.  Recheck lipid and CMP.  Continue atorvastatin 20 mg daily  #4 heart murmur noted left sternal border and right upper sternal border.  This is 2/6 systolic murmur.  Slightly more prominent with squat to stand.  Set up echocardiogram to further assess.   Evelena Peat, MD

## 2023-01-04 NOTE — Patient Instructions (Signed)
I will be setting up echocardiogram to further assess murmur.

## 2023-01-05 DIAGNOSIS — L648 Other androgenic alopecia: Secondary | ICD-10-CM | POA: Diagnosis not present

## 2023-01-05 DIAGNOSIS — L578 Other skin changes due to chronic exposure to nonionizing radiation: Secondary | ICD-10-CM | POA: Diagnosis not present

## 2023-01-05 DIAGNOSIS — L570 Actinic keratosis: Secondary | ICD-10-CM | POA: Diagnosis not present

## 2023-01-06 ENCOUNTER — Telehealth (HOSPITAL_BASED_OUTPATIENT_CLINIC_OR_DEPARTMENT_OTHER): Payer: Self-pay | Admitting: *Deleted

## 2023-01-06 NOTE — Telephone Encounter (Signed)
Left message for patient to call and schedule the Echocardiogram ordered by Dr. Burchette 

## 2023-01-07 DIAGNOSIS — T63461A Toxic effect of venom of wasps, accidental (unintentional), initial encounter: Secondary | ICD-10-CM | POA: Diagnosis not present

## 2023-02-08 ENCOUNTER — Ambulatory Visit (INDEPENDENT_AMBULATORY_CARE_PROVIDER_SITE_OTHER): Payer: Medicare Other

## 2023-02-08 DIAGNOSIS — R011 Cardiac murmur, unspecified: Secondary | ICD-10-CM | POA: Diagnosis not present

## 2023-02-09 LAB — ECHOCARDIOGRAM COMPLETE
AR max vel: 1.21 cm2
AV Area VTI: 1.25 cm2
AV Area mean vel: 1.16 cm2
AV Mean grad: 12 mmHg
AV Peak grad: 21.9 mmHg
Ao pk vel: 2.34 m/s
Area-P 1/2: 2.45 cm2
S' Lateral: 2.25 cm

## 2023-02-14 ENCOUNTER — Ambulatory Visit (INDEPENDENT_AMBULATORY_CARE_PROVIDER_SITE_OTHER): Payer: Medicare Other | Admitting: Family Medicine

## 2023-02-14 ENCOUNTER — Encounter: Payer: Self-pay | Admitting: Family Medicine

## 2023-02-14 VITALS — BP 124/68 | HR 76 | Temp 98.4°F | Ht 70.0 in | Wt 229.2 lb

## 2023-02-14 DIAGNOSIS — E785 Hyperlipidemia, unspecified: Secondary | ICD-10-CM | POA: Diagnosis not present

## 2023-02-14 DIAGNOSIS — R011 Cardiac murmur, unspecified: Secondary | ICD-10-CM | POA: Diagnosis not present

## 2023-02-14 DIAGNOSIS — I10 Essential (primary) hypertension: Secondary | ICD-10-CM | POA: Diagnosis not present

## 2023-02-14 NOTE — Progress Notes (Signed)
Established Patient Office Visit  Subjective   Patient ID: Lee Smith, male    DOB: Sep 30, 1945  Age: 77 y.o. MRN: [id number]  No chief complaint on file.   HPI   [name] is here today to discuss recent echocardiogram.  He had been seen here in June for Medicare annual wellness visit.  We auscultated a murmur which had not been documented previously which was heard best over the left sternal border.  He stays fairly active.  Does not have any history of syncope.  No dizziness or significant dyspnea with exercise or activities.  Patient with mild shortness of breath with climbing stairs. No recent chest pain.  Echo reviewed with patient.  He had left ventricular ejection fraction of 60 to 65%.  No regional wall motion abnormalities.  Mild LVH.  Left atrial size was moderately dilated.  Mitral valve was "degenerative" with only trivial mitral valve regurgitation and no evidence for mitral valve stenosis.  Aortic valve revealed mild calcification with mild aortic valve stenosis.  Borderline dilation of the ascending aorta measuring 41 mm.  Patient also goes to the [place] and was there recently and they also had auscultated his murmur.  He has hypertension and has been treated for years with amlodipine 5 mg daily and losartan 100 mg daily and blood pressure well-controlled.  Past Medical History:  Diagnosis Date   Allergic rhinitis    Arthritis    bilateral knees; has planned partial knee replacement next month   Genital warts    History of kidney stones 11/16/2013   lithotripsy for right ureteral stone   Hx of colonic polyps    2008   Hyperlipidemia    Hypertension    Recurrent upper respiratory infection (URI)    Right nephrolithiasis    Past Surgical History:  Procedure Laterality Date   ADENOIDECTOMY     CHOLECYSTECTOMY  07/19/2000   CYSTOSCOPY W/ URETERAL STENT PLACEMENT Right 02/12/2014   Procedure: CYSTOSCOPY WITH RETROGRADE PYELOGRAM/URETERAL STENT PLACEMENT;  Surgeon:  Jerilee Field, MD;  Location: [facility];  Service: Urology;  Laterality: Right;   CYSTOSCOPY WITH RETROGRADE PYELOGRAM, URETEROSCOPY AND STENT PLACEMENT Right 02/12/2014   Procedure: RIGHT URETEROSCOPY ;  Surgeon: Jerilee Field, MD;  Location: [facility];  Service: Urology;  Laterality: Right;   CYSTOSCOPY WITH URETEROSCOPY AND STENT PLACEMENT Right 03/05/2014   Procedure: CYSTOSCOPY WITH URETEROSCOPY AND STENT EXCHANGE, STONE EXTRACTION;  Surgeon: Jerilee Field, MD;  Location: [facility];  Service: Urology;  Laterality: Right;   CYSTOSCOPY WITH URETEROSCOPY AND STENT PLACEMENT Right 03/26/2014   Procedure: CYSTO WITH RIGHT URETEROSCOPY/STENT PLACEMENT;  Surgeon: Jerilee Field, MD;  Location: [facility];  Service: Urology;  Laterality: Right;   ESOPHAGOGASTRODUODENOSCOPY N/A 11/16/2021   Procedure: ESOPHAGOGASTRODUODENOSCOPY (EGD);  Surgeon: Willis Modena, MD;  Location: [facility];  Service: Gastroenterology;  Laterality: N/A;   EXTRACORPOREAL SHOCK WAVE LITHOTRIPSY Right 12/17/2013   HOLMIUM LASER APPLICATION Right 03/05/2014   Procedure: HOLMIUM LASER APPLICATION;  Surgeon: Jerilee Field, MD;  Location: [facility];  Service: Urology;  Laterality: Right;   IMPACTION REMOVAL  11/16/2021   Procedure: IMPACTION REMOVAL;  Surgeon: Willis Modena, MD;  Location: [facility];  Service: Gastroenterology;;   INGUINAL HERNIA REPAIR Bilateral right 2003/   left 2004   STONE EXTRACTION WITH BASKET Right 03/26/2014   Procedure: STONE EXTRACTION WITH BASKET;  Surgeon: Jerilee Field, MD;  Location: [facility];  Service: Urology;  Laterality: Right;  TONSILLECTOMY     TOTAL KNEE ARTHROPLASTY Left 02/27/2015   UMBILICAL HERNIA REPAIR  07/19/1998    reports that he quit smoking about 52 years ago. His smoking use included cigarettes. He started smoking about 58 years ago. He has a 6 pack-year  smoking history. He has quit using smokeless tobacco.  His smokeless tobacco use included chew. He reports current alcohol use of about 7.0 standard drinks of alcohol per week. He reports that he does not use drugs. family history includes Arthritis in an other family member; Cancer in his father and sister; Hyperlipidemia in an other family member; Hypertension in his mother and another family member. No Known Allergies  Review of Systems  Constitutional:  Negative for chills, fever and malaise/fatigue.  Eyes:  Negative for blurred vision.  Respiratory:  Negative for shortness of breath.   Cardiovascular:  Negative for chest pain.  Neurological:  Negative for dizziness, loss of consciousness, weakness and headaches.      Objective:     BP 124/68 (BP Location: Left Arm, Patient Position: Sitting, Cuff Size: Large)   Pulse 76   Temp 98.4 F (36.9 C) (Oral)   Ht 5\' 10"  (1.778 m)   Wt 229 lb 3.2 oz (104 kg)   SpO2 97%   BMI 32.89 kg/m  BP Readings from Last 3 Encounters:  02/14/23 124/68  01/04/23 110/66  11/16/21 136/74   Wt Readings from Last 3 Encounters:  02/14/23 229 lb 3.2 oz (104 kg)  01/04/23 234 lb 11.2 oz (106.5 kg)  11/16/21 72 lb 15.6 oz (33.1 kg)      Physical Exam Vitals reviewed.  Constitutional:      Appearance: Normal appearance.  Cardiovascular:     Rate and Rhythm: Normal rate and regular rhythm.     Heart sounds: Murmur heard.     Comments: Soft systolic murmur noted right upper sternal border and heard best along the left sternal border.  2 out of 6 Musculoskeletal:     Right lower leg: No edema.     Left lower leg: No edema.  Neurological:     Mental Status: He is alert.      No results found for any visits on 02/14/23.  Last CBC Lab Results  Component Value Date   WBC 4.7 01/04/2023   HGB 13.9 01/04/2023   HCT 42.1 01/04/2023   MCV 97.5 01/04/2023   MCH 32.7 11/22/2013   RDW 13.3 01/04/2023   PLT 218.0 01/04/2023   Last metabolic  panel Lab Results  Component Value Date   GLUCOSE 97 01/04/2023   NA 139 01/04/2023   K 4.0 01/04/2023   CL 105 01/04/2023   CO2 26 01/04/2023   BUN 16 01/04/2023   CREATININE 0.89 01/04/2023   GFR 82.44 01/04/2023   CALCIUM 9.0 01/04/2023   PROT 6.9 01/04/2023   ALBUMIN 4.1 01/04/2023   BILITOT 0.6 01/04/2023   ALKPHOS 63 01/04/2023   AST 21 01/04/2023   ALT 18 01/04/2023   Last lipids Lab Results  Component Value Date   CHOL 129 01/04/2023   HDL 43.40 01/04/2023   LDLCALC 70 01/04/2023   LDLDIRECT 166.7 04/23/2013   TRIG 79.0 01/04/2023   CHOLHDL 3 01/04/2023      The ASCVD Risk score (Arnett DK, et al., 2019) failed to calculate for the following reasons:   The valid total cholesterol range is 130 to 320 mg/dL   Unable to determine if patient is Non-Hispanic African American    Assessment &  Plan:   #1 hypertension well-controlled.  Continue amlodipine 5 mg daily and losartan 100 mg daily.  Continue low-sodium diet.  #2 heart murmur noted recently on exam.  Echocardiogram results reviewed with patient as above.  Mild aortic stenosis and degenerative mitral valve changes with only trivial mitral regurgitation.  Also comment of mildly dilated aortic root of 41 mm.  Patient is asymptomatic at this time.  He does request cardiology referral to discuss above findings in further detail  #3 hyperlipidemia fairly well-controlled with recent LDL cholesterol 70 on atorvastatin 20 mg daily  Evelena Peat, MD

## 2023-02-14 NOTE — Patient Instructions (Signed)
I will set up cardiology referral.

## 2023-03-10 ENCOUNTER — Encounter: Payer: Self-pay | Admitting: Family Medicine

## 2023-03-11 MED ORDER — ALBUTEROL SULFATE HFA 108 (90 BASE) MCG/ACT IN AERS: 2.0000 | INHALATION_SPRAY | Freq: Four times a day (QID) | RESPIRATORY_TRACT | 0 refills | Status: DC | PRN

## 2023-03-14 ENCOUNTER — Ambulatory Visit (INDEPENDENT_AMBULATORY_CARE_PROVIDER_SITE_OTHER): Payer: Medicare Other | Admitting: Family Medicine

## 2023-03-14 ENCOUNTER — Encounter: Payer: Self-pay | Admitting: Family Medicine

## 2023-03-14 VITALS — BP 130/80 | HR 70 | Temp 98.3°F | Ht 70.0 in | Wt 231.2 lb

## 2023-03-14 DIAGNOSIS — H60543 Acute eczematoid otitis externa, bilateral: Secondary | ICD-10-CM | POA: Diagnosis not present

## 2023-03-14 DIAGNOSIS — R1031 Right lower quadrant pain: Secondary | ICD-10-CM | POA: Diagnosis not present

## 2023-03-14 DIAGNOSIS — R0982 Postnasal drip: Secondary | ICD-10-CM | POA: Diagnosis not present

## 2023-03-14 DIAGNOSIS — R052 Subacute cough: Secondary | ICD-10-CM | POA: Diagnosis not present

## 2023-03-14 MED ORDER — MOMETASONE FUROATE 0.1 % EX SOLN: Freq: Every day | CUTANEOUS | 0 refills | Status: DC

## 2023-03-14 MED ORDER — PREDNISONE 20 MG PO TABS: ORAL_TABLET | ORAL | 0 refills | Status: DC

## 2023-03-14 NOTE — Progress Notes (Signed)
Established Patient Office Visit  Subjective   Patient ID: Lee Smith, male    DOB: 01/12/1946  Age: 77 y.o. MRN: [id number]  No chief complaint on file.   HPI   Lee Smith is seen with approximately 5 to 6-week history of intermittent nasal congestion.  He has some associated cough of clear mucus at night.  This seems to be worse later in the evenings.  He feels like this may be allergic.  He denies any facial pain, headache, fever, purulent nasal discharge, hemoptysis, or dyspnea.  He takes Flonase and Zyrtec regularly.  Home COVID test last week negative.  No body aches.  Quit smoking 1972.    He also complains of some pruritus involving both ear canals.  No drainage.  No associated pain.  He has had some sharp pains right inguinal area site of previous hernia repair.  He had bilateral hernia repair years ago.  Pain is sometimes noted just sitting.  Tends to be better with walking.  Has not noted any bulge.  Pain is usually very fleeting.  Past Medical History:  Diagnosis Date   Allergic rhinitis    Arthritis    bilateral knees; has planned partial knee replacement next month   Genital warts    History of kidney stones 11/16/2013   lithotripsy for right ureteral stone   Hx of colonic polyps    2008   Hyperlipidemia    Hypertension    Recurrent upper respiratory infection (URI)    Right nephrolithiasis    Past Surgical History:  Procedure Laterality Date   ADENOIDECTOMY     CHOLECYSTECTOMY  07/19/2000   CYSTOSCOPY W/ URETERAL STENT PLACEMENT Right 02/12/2014   Procedure: CYSTOSCOPY WITH RETROGRADE PYELOGRAM/URETERAL STENT PLACEMENT;  Surgeon: Jerilee Field, MD;  Location: [facility];  Service: Urology;  Laterality: Right;   CYSTOSCOPY WITH RETROGRADE PYELOGRAM, URETEROSCOPY AND STENT PLACEMENT Right 02/12/2014   Procedure: RIGHT URETEROSCOPY ;  Surgeon: Jerilee Field, MD;  Location: [facility];  Service: Urology;  Laterality: Right;    CYSTOSCOPY WITH URETEROSCOPY AND STENT PLACEMENT Right 03/05/2014   Procedure: CYSTOSCOPY WITH URETEROSCOPY AND STENT EXCHANGE, STONE EXTRACTION;  Surgeon: Jerilee Field, MD;  Location: [facility];  Service: Urology;  Laterality: Right;   CYSTOSCOPY WITH URETEROSCOPY AND STENT PLACEMENT Right 03/26/2014   Procedure: CYSTO WITH RIGHT URETEROSCOPY/STENT PLACEMENT;  Surgeon: Jerilee Field, MD;  Location: [facility];  Service: Urology;  Laterality: Right;   ESOPHAGOGASTRODUODENOSCOPY N/A 11/16/2021   Procedure: ESOPHAGOGASTRODUODENOSCOPY (EGD);  Surgeon: Willis Modena, MD;  Location: [facility];  Service: Gastroenterology;  Laterality: N/A;   EXTRACORPOREAL SHOCK WAVE LITHOTRIPSY Right 12/17/2013   HOLMIUM LASER APPLICATION Right 03/05/2014   Procedure: HOLMIUM LASER APPLICATION;  Surgeon: Jerilee Field, MD;  Location: [facility];  Service: Urology;  Laterality: Right;   IMPACTION REMOVAL  11/16/2021   Procedure: IMPACTION REMOVAL;  Surgeon: Willis Modena, MD;  Location: [facility];  Service: Gastroenterology;;   INGUINAL HERNIA REPAIR Bilateral right 2003/   left 2004   STONE EXTRACTION WITH BASKET Right 03/26/2014   Procedure: STONE EXTRACTION WITH BASKET;  Surgeon: Jerilee Field, MD;  Location: [facility];  Service: Urology;  Laterality: Right;   TONSILLECTOMY     TOTAL KNEE ARTHROPLASTY Left 02/27/2015   UMBILICAL HERNIA REPAIR  07/19/1998    reports that he quit smoking about 52 years ago. His smoking use included cigarettes. He started smoking about 58 years ago. He has a  6 pack-year smoking history. He has quit using smokeless tobacco.  His smokeless tobacco use included chew. He reports current alcohol use of about 7.0 standard drinks of alcohol per week. He reports that he does not use drugs. family history includes Arthritis in an other family member; Cancer in his father and sister; Hyperlipidemia in an other  family member; Hypertension in his mother and another family member. No Known Allergies  Review of Systems  Constitutional:  Negative for chills and fever.  HENT:  Positive for congestion. Negative for sinus pain.   Respiratory:  Positive for cough and wheezing. Negative for hemoptysis and shortness of breath.   Cardiovascular:  Negative for chest pain.      Objective:     There were no vitals taken for this visit. BP Readings from Last 3 Encounters:  02/14/23 124/68  01/04/23 110/66  11/16/21 136/74   Wt Readings from Last 3 Encounters:  02/14/23 229 lb 3.2 oz (104 kg)  01/04/23 234 lb 11.2 oz (106.5 kg)  11/16/21 72 lb 15.6 oz (33.1 kg)      Physical Exam Vitals reviewed.  Constitutional:      General: He is not in acute distress.    Appearance: Normal appearance. He is not ill-appearing.  HENT:     Right Ear: Tympanic membrane normal.     Left Ear: Tympanic membrane normal.     Ears:     Comments: Has little bit of flaking and dry skin external canals.  No significant erythema.  No exudate.    Mouth/Throat:     Mouth: Mucous membranes are moist.     Pharynx: Oropharynx is clear.  Cardiovascular:     Rate and Rhythm: Normal rate and regular rhythm.  Pulmonary:     Effort: Pulmonary effort is normal.     Breath sounds: Normal breath sounds. No wheezing or rales.  Genitourinary:    Comments: Right inguinal region examined.  No evidence for hernia.  Scar from previous surgery. Musculoskeletal:     Cervical back: Neck supple.  Lymphadenopathy:     Cervical: No cervical adenopathy.  Neurological:     Mental Status: He is alert.      No results found for any visits on 03/14/23.  Last CBC Lab Results  Component Value Date   WBC 4.7 01/04/2023   HGB 13.9 01/04/2023   HCT 42.1 01/04/2023   MCV 97.5 01/04/2023   MCH 32.7 11/22/2013   RDW 13.3 01/04/2023   PLT 218.0 01/04/2023   Last metabolic panel Lab Results  Component Value Date   GLUCOSE 97  01/04/2023   NA 139 01/04/2023   K 4.0 01/04/2023   CL 105 01/04/2023   CO2 26 01/04/2023   BUN 16 01/04/2023   CREATININE 0.89 01/04/2023   GFR 82.44 01/04/2023   CALCIUM 9.0 01/04/2023   PROT 6.9 01/04/2023   ALBUMIN 4.1 01/04/2023   BILITOT 0.6 01/04/2023   ALKPHOS 63 01/04/2023   AST 21 01/04/2023   ALT 18 01/04/2023   Last lipids Lab Results  Component Value Date   CHOL 129 01/04/2023   HDL 43.40 01/04/2023   LDLCALC 70 01/04/2023   LDLDIRECT 166.7 04/23/2013   TRIG 79.0 01/04/2023   CHOLHDL 3 01/04/2023      The ASCVD Risk score (Arnett DK, et al., 2019) failed to calculate for the following reasons:   The valid total cholesterol range is 130 to 320 mg/dL   Unable to determine if patient is Non-Hispanic African American  Assessment & Plan:   #1 patient presents with several week history of postnasal drip and intermittent productive cough at night.  Nonfocal exam.  No concerning symptoms such as fever, dyspnea, etc.  Suspect allergic postnasal drip related.  -Continue Flonase and Zyrtec -Short prednisone course 20 mg 2 tablets daily for 5 days -Consider addition of Astelin or Singulair if symptoms persist -Watch for any signs of secondary infection  #2 right inguinal pain.  No evidence for hernia on exam today.  Questional scar irritation.  Reassuring exam.  No adenopathy noted.  Observe for now.  #3 pruritus involving external ear canals.  Has some mild eczema changes.  Elocon lotion 0.1% to use once daily as needed  Evelena Peat, MD

## 2023-03-22 ENCOUNTER — Encounter: Payer: Self-pay | Admitting: Family Medicine

## 2023-03-22 DIAGNOSIS — Z23 Encounter for immunization: Secondary | ICD-10-CM | POA: Diagnosis not present

## 2023-03-24 MED ORDER — AZELASTINE HCL 0.1 % NA SOLN: 1.0000 | Freq: Two times a day (BID) | NASAL | 0 refills | Status: AC

## 2023-04-18 DIAGNOSIS — L570 Actinic keratosis: Secondary | ICD-10-CM | POA: Diagnosis not present

## 2023-04-22 NOTE — Progress Notes (Unsigned)
Cardiology Office Note:    Date:  04/22/2023   ID:  IMRAN NUON, DOB 05-10-1946, MRN [id number]  PCP:  Kristian Covey, MD   [facility] Providers Cardiologist:  None { Click to update primary MD,subspecialty MD or APP then REFRESH:1}    Referring MD: Kristian Covey, MD   No chief complaint on file. ***  History of Present Illness:    Lee Smith is a 77 y.o. male seen at the request of Dr Caryl Never for evaluation of heart murmur and aortic enlargement. He has a history of HLD and HTN.   Past Medical History:  Diagnosis Date   Allergic rhinitis    Arthritis    bilateral knees; has planned partial knee replacement next month   Genital warts    History of kidney stones 11/16/2013   lithotripsy for right ureteral stone   Hx of colonic polyps    2008   Hyperlipidemia    Hypertension    Recurrent upper respiratory infection (URI)    Right nephrolithiasis     Past Surgical History:  Procedure Laterality Date   ADENOIDECTOMY     CHOLECYSTECTOMY  07/19/2000   CYSTOSCOPY W/ URETERAL STENT PLACEMENT Right 02/12/2014   Procedure: CYSTOSCOPY WITH RETROGRADE PYELOGRAM/URETERAL STENT PLACEMENT;  Surgeon: Jerilee Field, MD;  Location: [facility];  Service: Urology;  Laterality: Right;   CYSTOSCOPY WITH RETROGRADE PYELOGRAM, URETEROSCOPY AND STENT PLACEMENT Right 02/12/2014   Procedure: RIGHT URETEROSCOPY ;  Surgeon: Jerilee Field, MD;  Location[facility];  Service: Urology;  Laterality: Right;   CYSTOSCOPY WITH URETEROSCOPY AND STENT PLACEMENT Right 03/05/2014   Procedure: CYSTOSCOPY WITH URETEROSCOPY AND STENT EXCHANGE, STONE EXTRACTION;  Surgeon: Jerilee Field, MD;  Location: [facility];  Service: Urology;  Laterality: Right;   CYSTOSCOPY WITH URETEROSCOPY AND STENT PLACEMENT Right 03/26/2014   Procedure: CYSTO WITH RIGHT URETEROSCOPY/STENT PLACEMENT;  Surgeon: Jerilee Field, MD;  Location:  [facility];  Service: Urology;  Laterality: Right;   ESOPHAGOGASTRODUODENOSCOPY N/A 11/16/2021   Procedure: ESOPHAGOGASTRODUODENOSCOPY (EGD);  Surgeon: Willis Modena, MD;  Location: [facility];  Service: Gastroenterology;  Laterality: N/A;   EXTRACORPOREAL SHOCK WAVE LITHOTRIPSY Right 12/17/2013   HOLMIUM LASER APPLICATION Right 03/05/2014   Procedure: HOLMIUM LASER APPLICATION;  Surgeon: Jerilee Field, MD;  Location: [facility];  Service: Urology;  Laterality: Right;   IMPACTION REMOVAL  11/16/2021   Procedure: IMPACTION REMOVAL;  Surgeon: Willis Modena, MD;  Location: [facility];  Service: Gastroenterology;;   INGUINAL HERNIA REPAIR Bilateral right 2003/   left 2004   STONE EXTRACTION WITH BASKET Right 03/26/2014   Procedure: STONE EXTRACTION WITH BASKET;  Surgeon: Jerilee Field, MD;  Location: [place];  Service: Urology;  Laterality: Right;   TONSILLECTOMY     TOTAL KNEE ARTHROPLASTY Left 02/27/2015   UMBILICAL HERNIA REPAIR  07/19/1998    Current Medications: No outpatient medications have been marked as taking for the 04/28/23 encounter (Appointment) with [place], Savvy Peeters M, MD.     Allergies:   Patient has no known allergies.   Social History   Socioeconomic History   Marital status: Married    Spouse name: Not on file   Number of children: 1   Years of education: Not on file   Highest education level: Bachelor's degree (e.g., BA, AB, BS)  Occupational History   Not on file  Tobacco Use   Smoking status: Former    Current packs/day: 0.00  Average packs/day: 1 pack/day for 6.0 years (6.0 ttl pk-yrs)    Types: Cigarettes    Start date: 10/11/1964    Quit date: 10/12/1970    Years since quitting: 52.5   Smokeless tobacco: Former    Types: Chew  Substance and Sexual Activity   Alcohol use: Yes    Alcohol/week: 7.0 standard drinks of alcohol    Types: 7 Glasses of wine per week    Comment: never drinks more than  one per day   Drug use: No   Sexual activity: Not on file  Other Topics Concern   Not on file  Social History Narrative   Right Handed    Lives in a two story home with a basement.   Has 1 daughter   Social Determinants of Health   Financial Resource Strain: Low Risk  (02/10/2023)   Overall Financial Resource Strain (CARDIA)    Difficulty of Paying Living Expenses: Not hard at all  Food Insecurity: No Food Insecurity (02/10/2023)   Hunger Vital Sign    Worried About Running Out of Food in the Last Year: Never true    Ran Out of Food in the Last Year: Never true  Transportation Needs: No Transportation Needs (02/10/2023)   PRAPARE - Administrator, Civil Service (Medical): No    Lack of Transportation (Non-Medical): No  Physical Activity: Sufficiently Active (02/10/2023)   Exercise Vital Sign    Days of Exercise per Week: 5 days    Minutes of Exercise per Session: 60 min  Stress: No Stress Concern Present (02/10/2023)   [employer] of Occupational Health - Occupational Stress Questionnaire    Feeling of Stress : Not at all  Social Connections: Socially Integrated (02/10/2023)   Social Connection and Isolation Panel [NHANES]    Frequency of Communication with Friends and Family: More than three times a week    Frequency of Social Gatherings with Friends and Family: More than three times a week    Attends Religious Services: More than 4 times per year    Active Member of [employer] or Organizations: Yes    Attends Engineer, structural: More than 4 times per year    Marital Status: Married     Family History: The patient's ***family history includes Arthritis in an other family member; Cancer in his father and sister; Hyperlipidemia in an other family member; Hypertension in his mother and another family member.  ROS:   Please see the history of present illness.    *** All other systems reviewed and are negative.  EKGs/Labs/Other Studies Reviewed:    The  following studies were reviewed today: Echo 02/08/23: IMPRESSIONS     1. Left ventricular ejection fraction, by estimation, is 60 to 65%. The  left ventricle has normal function. The left ventricle has no regional  wall motion abnormalities. There is mild left ventricular hypertrophy.  Left ventricular diastolic parameters  are indeterminate.   2. Right ventricular systolic function is normal. The right ventricular  size is normal. Tricuspid regurgitation signal is inadequate for assessing  PA pressure.   3. Left atrial size was moderately dilated.   4. The mitral valve is degenerative. Trivial mitral valve regurgitation.  No evidence of mitral stenosis.   5. The aortic valve is abnormal. There is mild calcification of the  aortic valve. There is mild thickening of the aortic valve. Aortic valve  regurgitation is not visualized. Mild aortic valve stenosis. Aortic valve  area, by VTI measures 1.25  cm. Aortic   valve mean gradient measures 12.0 mmHg. Aortic valve Vmax measures 2.34  m/s.   6. Aortic dilatation noted. There is borderline dilatation of the  ascending aorta, measuring 41 mm.   7. The inferior vena cava is normal in size with greater than 50%  respiratory variability, suggesting right atrial pressure of 3 mmHg.        Recent Labs: 01/04/2023: ALT 18; BUN 16; Creatinine, Ser 0.89; Hemoglobin 13.9; Platelets 218.0; Potassium 4.0; Sodium 139  Recent Lipid Panel    Component Value Date/Time   CHOL 129 01/04/2023 1359   TRIG 79.0 01/04/2023 1359   HDL 43.40 01/04/2023 1359   CHOLHDL 3 01/04/2023 1359   VLDL 15.8 01/04/2023 1359   LDLCALC 70 01/04/2023 1359   LDLDIRECT 166.7 04/23/2013 1055     Risk Assessment/Calculations:   {Does this patient have ATRIAL FIBRILLATION?:[phone]}  No BP recorded.  {Refresh Note OR Click here to enter BP  :1}***         Physical Exam:    VS:  There were no vitals taken for this visit.    Wt Readings from Last 3 Encounters:   03/14/23 231 lb 3.2 oz (104.9 kg)  02/14/23 229 lb 3.2 oz (104 kg)  01/04/23 234 lb 11.2 oz (106.5 kg)     GEN: *** Well nourished, well developed in no acute distress HEENT: Normal NECK: No JVD; No carotid bruits LYMPHATICS: No lymphadenopathy CARDIAC: ***RRR, no murmurs, rubs, gallops RESPIRATORY:  Clear to auscultation without rales, wheezing or rhonchi  ABDOMEN: Soft, non-tender, non-distended MUSCULOSKELETAL:  No edema; No deformity  SKIN: Warm and dry NEUROLOGIC:  Alert and oriented x 3 PSYCHIATRIC:  Normal affect   ASSESSMENT:    No diagnosis found. PLAN:    In order of problems listed above:  ***      {Are you ordering a CV Procedure (e.g. stress test, cath, DCCV, TEE, etc)?   Press F2        :[id number]}    Medication Adjustments/Labs and Tests Ordered: Current medicines are reviewed at length with the patient today.  Concerns regarding medicines are outlined above.  No orders of the defined types were placed in this encounter.  No orders of the defined types were placed in this encounter.   There are no Patient Instructions on file for this visit.   Signed, Joseh Sjogren [place], MD  04/22/2023 7:15 AM    [facility]

## 2023-04-28 ENCOUNTER — Encounter: Payer: Self-pay | Admitting: Cardiology

## 2023-04-28 ENCOUNTER — Ambulatory Visit: Payer: Medicare Other | Attending: Cardiology | Admitting: Cardiology

## 2023-04-28 VITALS — BP 134/80 | HR 69 | Ht 72.0 in | Wt 233.0 lb

## 2023-04-28 DIAGNOSIS — I4510 Unspecified right bundle-branch block: Secondary | ICD-10-CM | POA: Diagnosis not present

## 2023-04-28 DIAGNOSIS — E785 Hyperlipidemia, unspecified: Secondary | ICD-10-CM | POA: Diagnosis not present

## 2023-04-28 DIAGNOSIS — I350 Nonrheumatic aortic (valve) stenosis: Secondary | ICD-10-CM | POA: Diagnosis not present

## 2023-04-28 DIAGNOSIS — I10 Essential (primary) hypertension: Secondary | ICD-10-CM | POA: Diagnosis not present

## 2023-04-28 NOTE — Patient Instructions (Signed)
Medication Instructions:  Continue medication *If you need a refill on your cardiac medications before your next appointment, please call your pharmacy*   Lab Work: none  Testing/Procedures: none   Follow-Up: At [facility], you and your health needs are our priority.  As part of our continuing mission to provide you with exceptional heart care, we have created designated Provider Care Teams.  These Care Teams include your primary Cardiologist (physician) and Advanced Practice Providers (APPs -  Physician Assistants and Nurse Practitioners) who all work together to provide you with the care you need, when you need it.  We recommend signing up for the patient portal called "MyChart".  Sign up information is provided on this After Visit Summary.  MyChart is used to connect with patients for Virtual Visits (Telemedicine).  Patients are able to view lab/test results, encounter notes, upcoming appointments, etc.  Non-urgent messages can be sent to your provider as well.   To learn more about what you can do with MyChart, go to [email or web address].    Your next appointment:   Follow up in one Year  call to schedule in June to schedule for [facility]  Provider:   Dr. [place]

## 2023-05-03 DIAGNOSIS — Z23 Encounter for immunization: Secondary | ICD-10-CM | POA: Diagnosis not present

## 2023-05-17 DIAGNOSIS — L570 Actinic keratosis: Secondary | ICD-10-CM | POA: Diagnosis not present

## 2023-05-17 DIAGNOSIS — L821 Other seborrheic keratosis: Secondary | ICD-10-CM | POA: Diagnosis not present

## 2023-05-17 DIAGNOSIS — D225 Melanocytic nevi of trunk: Secondary | ICD-10-CM | POA: Diagnosis not present

## 2023-05-17 DIAGNOSIS — L814 Other melanin hyperpigmentation: Secondary | ICD-10-CM | POA: Diagnosis not present

## 2023-05-17 DIAGNOSIS — D492 Neoplasm of unspecified behavior of bone, soft tissue, and skin: Secondary | ICD-10-CM | POA: Diagnosis not present

## 2023-06-20 ENCOUNTER — Ambulatory Visit
Admission: EM | Admit: 2023-06-20 | Discharge: 2023-06-20 | Disposition: A | Discharge status: Home / Self Care | Payer: Medicare Other | Attending: Family Medicine | Admitting: Family Medicine

## 2023-06-20 ENCOUNTER — Ambulatory Visit (INDEPENDENT_AMBULATORY_CARE_PROVIDER_SITE_OTHER): Payer: Medicare Other

## 2023-06-20 DIAGNOSIS — J988 Other specified respiratory disorders: Secondary | ICD-10-CM | POA: Diagnosis not present

## 2023-06-20 DIAGNOSIS — R059 Cough, unspecified: Secondary | ICD-10-CM | POA: Diagnosis not present

## 2023-06-20 DIAGNOSIS — B9789 Other viral agents as the cause of diseases classified elsewhere: Secondary | ICD-10-CM | POA: Diagnosis not present

## 2023-06-20 DIAGNOSIS — J4530 Mild persistent asthma, uncomplicated: Secondary | ICD-10-CM | POA: Diagnosis not present

## 2023-06-20 DIAGNOSIS — J209 Acute bronchitis, unspecified: Secondary | ICD-10-CM | POA: Diagnosis not present

## 2023-06-20 DIAGNOSIS — R52 Pain, unspecified: Secondary | ICD-10-CM | POA: Diagnosis not present

## 2023-06-20 LAB — POCT INFLUENZA A/B
Influenza A, POC: NEGATIVE
Influenza B, POC: NEGATIVE

## 2023-06-20 MED ORDER — PREDNISONE 20 MG PO TABS: ORAL_TABLET | ORAL | 0 refills | Status: DC

## 2023-06-20 MED ORDER — CETIRIZINE HCL 5 MG PO TABS: 5.0000 mg | ORAL_TABLET | Freq: Every day | ORAL | 0 refills | Status: AC

## 2023-06-20 MED ORDER — PROMETHAZINE-DM 6.25-15 MG/5ML PO SYRP: 5.0000 mL | ORAL_SOLUTION | Freq: Three times a day (TID) | ORAL | 0 refills | Status: DC | PRN

## 2023-06-20 NOTE — Discharge Instructions (Addendum)
Start prednisone and albuterol to help with your sinus, coughing and breathing. Will update you with your x-ray and flu results later today. We will finalize diagnoses then but for now will manage for a viral respiratory illness. Covid test results will be available tomorrow.

## 2023-06-20 NOTE — ED Triage Notes (Signed)
Pt reports fever, cough and body ache sx 1 day. States he feels like having the Flu. Robitussin gives some relief.

## 2023-06-20 NOTE — ED Provider Notes (Signed)
[facility]  Note:  This document was prepared using Conservation officer, historic buildings and may include unintentional dictation errors.  MRN: [id number] DOB: [birth year]  Subjective:   Lee Smith is a 77 y.o. male presenting for 1 day history of acute onset fever, coughing, body pains, malaise and fatigue.  Reports that he feels like he has the flu.  Would like a flu test.  Has a history of reactive airways, chronic rhinitis.  Uses an inhaler, does not need refills.  Quit smoking many years ago.  No current facility-administered medications for this encounter.  Current Outpatient Medications:    acetaminophen (TYLENOL) 500 MG tablet, Take 500 mg by mouth every 6 (six) hours as needed., Disp: , Rfl:    albuterol (VENTOLIN HFA) 108 (90 Base) MCG/ACT inhaler, Inhale 2 puffs into the lungs every 6 (six) hours as needed for wheezing or shortness of breath., Disp: 1 each, Rfl: 0   amLODipine (NORVASC) 5 MG tablet, Take 1 tablet (5 mg total) by mouth daily., Disp: 90 tablet, Rfl: 3   atorvastatin (LIPITOR) 20 MG tablet, Take 1 tablet (20 mg total) by mouth daily., Disp: 90 tablet, Rfl: 3   azelastine (ASTELIN) 0.1 % nasal spray, Place 1-2 sprays into both nostrils 2 (two) times daily. as needed for allergies, Disp: 30 mL, Rfl: 0   Cholecalciferol (D3-50 PO), Take by mouth., Disp: , Rfl:    fluticasone (FLONASE) 50 MCG/ACT nasal spray, Place into both nostrils daily., Disp: , Rfl:    losartan (COZAAR) 100 MG tablet, Take 1 tablet (100 mg total) by mouth daily., Disp: 90 tablet, Rfl: 3   Naproxen Sodium 220 MG CAPS, Take by mouth., Disp: , Rfl:    tadalafil (CIALIS) 20 MG tablet, tadalafil 20 mg tablet  Take 1 tablet every day by oral route., Disp: , Rfl:    tamsulosin (FLOMAX) 0.4 MG CAPS capsule, Take 0.4 mg by mouth., Disp: , Rfl:    vitamin B-12 (CYANOCOBALAMIN) 500 MCG tablet, Take 2 tablets by mouth daily., Disp: , Rfl:    No Known Allergies  Past Medical  History:  Diagnosis Date   Allergic rhinitis    Arthritis    bilateral knees; has planned partial knee replacement next month   Genital warts    History of kidney stones 11/16/2013   lithotripsy for right ureteral stone   Hx of colonic polyps    2008   Hyperlipidemia    Hypertension    Recurrent upper respiratory infection (URI)    Right nephrolithiasis      Past Surgical History:  Procedure Laterality Date   ADENOIDECTOMY     CHOLECYSTECTOMY  07/19/2000   CYSTOSCOPY W/ URETERAL STENT PLACEMENT Right 02/12/2014   Procedure: CYSTOSCOPY WITH RETROGRADE PYELOGRAM/URETERAL STENT PLACEMENT;  Surgeon: Jerilee Field, MD;  Location: [facility];  Service: Urology;  Laterality: Right;   CYSTOSCOPY WITH RETROGRADE PYELOGRAM, URETEROSCOPY AND STENT PLACEMENT Right 02/12/2014   Procedure: RIGHT URETEROSCOPY ;  Surgeon: Jerilee Field, MD;  Location: [facility];  Service: Urology;  Laterality: Right;   CYSTOSCOPY WITH URETEROSCOPY AND STENT PLACEMENT Right 03/05/2014   Procedure: CYSTOSCOPY WITH URETEROSCOPY AND STENT EXCHANGE, STONE EXTRACTION;  Surgeon: Jerilee Field, MD;  Location: [facility];  Service: Urology;  Laterality: Right;   CYSTOSCOPY WITH URETEROSCOPY AND STENT PLACEMENT Right 03/26/2014   Procedure: CYSTO WITH RIGHT URETEROSCOPY/STENT PLACEMENT;  Surgeon: Jerilee Field, MD;  Location: [facility];  Service: Urology;  Laterality: Right;   ESOPHAGOGASTRODUODENOSCOPY N/A 11/16/2021   Procedure: ESOPHAGOGASTRODUODENOSCOPY (EGD);  Surgeon: Willis Modena, MD;  Location: [facility];  Service: Gastroenterology;  Laterality: N/A;   EXTRACORPOREAL SHOCK WAVE LITHOTRIPSY Right 12/17/2013   HOLMIUM LASER APPLICATION Right 03/05/2014   Procedure: HOLMIUM LASER APPLICATION;  Surgeon: Jerilee Field, MD;  Location: [facility];  Service: Urology;  Laterality: Right;   IMPACTION REMOVAL  11/16/2021    Procedure: IMPACTION REMOVAL;  Surgeon: Willis Modena, MD;  Location: [facility];  Service: Gastroenterology;;   INGUINAL HERNIA REPAIR Bilateral right 2003/   left 2004   STONE EXTRACTION WITH BASKET Right 03/26/2014   Procedure: STONE EXTRACTION WITH BASKET;  Surgeon: Jerilee Field, MD;  Location: [facility];  Service: Urology;  Laterality: Right;   TONSILLECTOMY     TOTAL KNEE ARTHROPLASTY Left 02/27/2015   UMBILICAL HERNIA REPAIR  07/19/1998    Family History  Problem Relation Age of Onset   Hypertension Mother    Cancer Father        prostate   Cancer Sister        breast   Arthritis Other        family hx   Hyperlipidemia Other        family hx   Hypertension Other        family hx    Social History   Tobacco Use   Smoking status: Former    Current packs/day: 0.00    Average packs/day: 1 pack/day for 6.0 years (6.0 ttl pk-yrs)    Types: Cigarettes    Start date: 10/11/1964    Quit date: 10/12/1970    Years since quitting: 52.7   Smokeless tobacco: Former    Types: Chew  Substance Use Topics   Alcohol use: Yes    Alcohol/week: 7.0 standard drinks of alcohol    Types: 7 Glasses of wine per week    Comment: never drinks more than one per day   Drug use: No    ROS   Objective:   Vitals: BP (!) 157/85 (BP Location: Left Arm)   Pulse 95   Temp 98.9 F (37.2 C) (Oral)   Resp 20   SpO2 92%   Physical Exam Constitutional:      General: He is not in acute distress.    Appearance: Normal appearance. He is well-developed and normal weight. He is not ill-appearing, toxic-appearing or diaphoretic.  HENT:     Head: Normocephalic and atraumatic.     Right Ear: Tympanic membrane, ear canal and external ear normal. No drainage, swelling or tenderness. No middle ear effusion. There is no impacted cerumen. Tympanic membrane is not erythematous or bulging.     Left Ear: Tympanic membrane, ear canal and external ear normal. No drainage, swelling or  tenderness.  No middle ear effusion. There is no impacted cerumen. Tympanic membrane is not erythematous or bulging.     Nose: Congestion present. No rhinorrhea.     Mouth/Throat:     Mouth: Mucous membranes are moist.     Pharynx: No oropharyngeal exudate or posterior oropharyngeal erythema.     Comments: Postnasal drainage overlying pharynx. Eyes:     General: No scleral icterus.       Right eye: No discharge.        Left eye: No discharge.     Extraocular Movements: Extraocular movements intact.     Conjunctiva/sclera: Conjunctivae normal.  Cardiovascular:     Rate and Rhythm: Normal rate and regular  rhythm.     Heart sounds: Normal heart sounds. No murmur heard.    No friction rub. No gallop.  Pulmonary:     Effort: Pulmonary effort is normal. No respiratory distress.     Breath sounds: No stridor. Rhonchi (light over mid lung fields bilaterally) present. No wheezing or rales.  Musculoskeletal:     Cervical back: Normal range of motion and neck supple. No rigidity. No muscular tenderness.  Neurological:     General: No focal deficit present.     Mental Status: He is alert and oriented to person, place, and time.  Psychiatric:        Mood and Affect: Mood normal.        Behavior: Behavior normal.        Thought Content: Thought content normal.    DG Chest 2 View  Result Date: 06/20/2023 CLINICAL DATA:  Cough. EXAM: CHEST - 2 VIEW COMPARISON:  Radiographs 06/19/2021. No additional comparison studies. FINDINGS: The heart size and mediastinal contours are stable. Suspected chronic central airway thickening without hyperinflation, confluent airspace disease, pleural effusion or pneumothorax. There are mild degenerative changes in the spine. No acute osseous abnormalities. IMPRESSION: Suspected chronic central airway thickening without evidence of acute cardiopulmonary process. Electronically Signed   By: Carey Bullocks M.D.   On: 06/20/2023 15:58    Results for orders placed or  performed during the hospital encounter of 06/20/23 (from the past 24 hour(s))  POCT Influenza A/B     Status: None   Collection Time: 06/20/23  1:20 PM  Result Value Ref Range   Influenza A, POC Negative Negative   Influenza B, POC Negative Negative   Assessment and Plan :   PDMP not reviewed this encounter.  1. Acute bronchitis, unspecified organism   2. Body aches   3. Mild persistent asthma, uncomplicated    Will manage for bronchitis with supportive care, prednisone, albuterol.  Patient has an inhaler at home, does not need a refill.  Chest x-ray did not demonstrate pneumonia and therefore we will hold off on antibiotics.  Counseled patient on potential for adverse effects with medications prescribed/recommended today, ER and return-to-clinic precautions discussed, patient verbalized understanding.    Wallis Bamberg, [place] 06/20/23 [id number]

## 2023-06-21 ENCOUNTER — Telehealth: Payer: Self-pay | Admitting: Family Medicine

## 2023-06-21 ENCOUNTER — Ambulatory Visit: Payer: Medicare Other | Admitting: Family Medicine

## 2023-06-21 LAB — SARS CORONAVIRUS 2 (TAT 6-24 HRS): SARS Coronavirus 2: POSITIVE — AB

## 2023-06-21 NOTE — Telephone Encounter (Signed)
Patient informed of the message below and voiced understanding. Patient reported he is feeling better today and declined antivirals at this time.

## 2023-06-21 NOTE — Telephone Encounter (Signed)
Pt went [facility] yesterday and tested positive for covid and he would like to know if he need anything. Pt is feeling better  and did cancel today appointment [facility] [address] [place] - [address] Phone: [phone]  Fax: [phone]

## 2023-06-21 NOTE — Telephone Encounter (Signed)
Left a message for the patient to return my call.  

## 2023-11-15 DIAGNOSIS — L570 Actinic keratosis: Secondary | ICD-10-CM | POA: Diagnosis not present

## 2023-11-15 DIAGNOSIS — L578 Other skin changes due to chronic exposure to nonionizing radiation: Secondary | ICD-10-CM | POA: Diagnosis not present

## 2023-11-27 ENCOUNTER — Other Ambulatory Visit: Payer: Self-pay | Admitting: Family Medicine

## 2023-12-01 DIAGNOSIS — N200 Calculus of kidney: Secondary | ICD-10-CM | POA: Diagnosis not present

## 2023-12-01 DIAGNOSIS — N401 Enlarged prostate with lower urinary tract symptoms: Secondary | ICD-10-CM | POA: Diagnosis not present

## 2023-12-01 DIAGNOSIS — N402 Nodular prostate without lower urinary tract symptoms: Secondary | ICD-10-CM | POA: Diagnosis not present

## 2023-12-01 DIAGNOSIS — R350 Frequency of micturition: Secondary | ICD-10-CM | POA: Diagnosis not present

## 2023-12-29 ENCOUNTER — Telehealth: Payer: Self-pay | Admitting: Family Medicine

## 2023-12-29 DIAGNOSIS — N401 Enlarged prostate with lower urinary tract symptoms: Secondary | ICD-10-CM | POA: Diagnosis not present

## 2023-12-29 DIAGNOSIS — R350 Frequency of micturition: Secondary | ICD-10-CM | POA: Diagnosis not present

## 2023-12-29 DIAGNOSIS — N281 Cyst of kidney, acquired: Secondary | ICD-10-CM | POA: Diagnosis not present

## 2023-12-29 DIAGNOSIS — Z87442 Personal history of urinary calculi: Secondary | ICD-10-CM | POA: Diagnosis not present

## 2023-12-29 NOTE — Telephone Encounter (Signed)
 Copied from CRM [id number]. Topic: Appointments - Scheduling Inquiry for Clinic >> Dec 29, 2023 10:17 AM Albertha Alosa wrote: Reason for CRM: Patient called in would like to schedule for annual wellness appointment first appointment isnt available until November he would like for something sooner would like a callback to schedule sooner

## 2024-01-03 NOTE — Telephone Encounter (Signed)
 Spoke with patient  he only wanted to see provider  transfer to office to get appt scheudled

## 2024-01-11 ENCOUNTER — Other Ambulatory Visit: Payer: Self-pay | Admitting: Family Medicine

## 2024-02-11 ENCOUNTER — Other Ambulatory Visit: Payer: Self-pay | Admitting: Family Medicine

## 2024-02-13 ENCOUNTER — Encounter: Payer: Self-pay | Admitting: Family Medicine

## 2024-02-14 MED ORDER — ATORVASTATIN CALCIUM 20 MG PO TABS: 20.0000 mg | ORAL_TABLET | Freq: Every day | ORAL | 0 refills | Status: DC

## 2024-02-14 MED ORDER — AMLODIPINE BESYLATE 5 MG PO TABS: 5.0000 mg | ORAL_TABLET | Freq: Every day | ORAL | 0 refills | Status: DC

## 2024-02-20 ENCOUNTER — Ambulatory Visit (INDEPENDENT_AMBULATORY_CARE_PROVIDER_SITE_OTHER)

## 2024-02-20 VITALS — BP 128/64 | HR 60 | Temp 97.8°F | Ht 72.0 in | Wt 238.7 lb

## 2024-02-20 DIAGNOSIS — Z0000 Encounter for general adult medical examination without abnormal findings: Secondary | ICD-10-CM | POA: Diagnosis not present

## 2024-02-20 NOTE — Progress Notes (Signed)
 Subjective:   ATTILA MCCARTHY is a 78 y.o. who presents for a Medicare Wellness preventive visit.  As a reminder, Annual Wellness Visits don't include a physical exam, and some assessments may be limited, especially if this visit is performed virtually. We may recommend an in-person follow-up visit with your provider if needed.  Visit Complete: In person    Persons Participating in Visit: Patient.  AWV Questionnaire: Yes: Patient Medicare AWV questionnaire was completed by the patient on 02/13/24; I have confirmed that all information answered by patient is correct and no changes since this date.  Cardiac Risk Factors include: advanced age (>41men, >20 women);male gender;hypertension     Objective:    Today's Vitals   02/20/24 1104  BP: 128/64  Pulse: 60  Temp: 97.8 F (36.6 C)  TempSrc: Oral  SpO2: 97%  Weight: 238 lb 11.2 oz (108.3 kg)  Height: 6' (1.829 m)   Body mass index is 32.37 kg/m.     02/20/2024   11:28 AM 11/16/2021   10:35 PM 11/16/2021    8:13 PM 03/24/2021    8:34 AM 11/09/2017   10:24 AM 03/14/2017    8:54 AM 10/28/2016    8:47 AM  Advanced Directives  Does Patient Have a Medical Advance Directive? Yes Yes No Yes Yes  Yes  Yes   Type of Estate agent of [place];Living will Healthcare Power of [place];Living will  Healthcare Power of [place];Living will;Out of facility DNR (pink MOST or yellow form)  Living will Healthcare Power of [place];Living will  Does patient want to make changes to medical advance directive?       No - Patient declined   Copy of Healthcare Power of Attorney in Chart? No - copy requested No - copy requested     No - copy requested   Would patient like information on creating a medical advance directive?       No - Patient declined      Data saved with a previous flowsheet row definition    Current Medications (verified) Outpatient Encounter Medications as of 02/20/2024  Medication Sig   acetaminophen  (TYLENOL )  500 MG tablet Take 500 mg by mouth every 6 (six) hours as needed.   albuterol  (VENTOLIN  HFA) 108 (90 Base) MCG/ACT inhaler INHALE 2 PUFFS BY MOUTH EVERY 6 HOURS AS NEEDED FOR WHEEZING OR SHORTNESS OF BREATH   amLODipine  (NORVASC ) 5 MG tablet Take 1 tablet (5 mg total) by mouth daily.   atorvastatin  (LIPITOR) 20 MG tablet Take 1 tablet (20 mg total) by mouth daily.   azelastine  (ASTELIN ) 0.1 % nasal spray Place 1-2 sprays into both nostrils 2 (two) times daily. as needed for allergies   cetirizine  (ZYRTEC ) 5 MG tablet Take 1 tablet (5 mg total) by mouth daily.   Cholecalciferol (D3-50 PO) Take by mouth.   fluticasone (FLONASE) 50 MCG/ACT nasal spray Place into both nostrils daily.   losartan  (COZAAR ) 100 MG tablet Take 1 tablet (100 mg total) by mouth daily.   Naproxen Sodium 220 MG CAPS Take by mouth.   predniSONE  (DELTASONE ) 20 MG tablet Take 2 tablets daily with breakfast.   promethazine -dextromethorphan (PROMETHAZINE -DM) 6.25-15 MG/5ML syrup Take 5 mLs by mouth 3 (three) times daily as needed for cough.   tadalafil  (CIALIS ) 20 MG tablet tadalafil  20 mg tablet  Take 1 tablet every day by oral route.   tamsulosin  (FLOMAX ) 0.4 MG CAPS capsule Take 0.4 mg by mouth.   vitamin B-12 (CYANOCOBALAMIN) 500 MCG tablet Take  2 tablets by mouth daily.   No facility-administered encounter medications on file as of 02/20/2024.    Allergies (verified) Patient has no known allergies.   History: Past Medical History:  Diagnosis Date   Allergic rhinitis    Arthritis    bilateral knees; has planned partial knee replacement next month   Genital warts    History of kidney stones 11/16/2013   lithotripsy for right ureteral stone   Hx of colonic polyps    2008   Hyperlipidemia    Hypertension    Recurrent upper respiratory infection (URI)    Right nephrolithiasis    Past Surgical History:  Procedure Laterality Date   ADENOIDECTOMY     CHOLECYSTECTOMY  07/19/2000   CYSTOSCOPY W/ URETERAL STENT  PLACEMENT Right 02/12/2014   Procedure: CYSTOSCOPY WITH RETROGRADE PYELOGRAM/URETERAL STENT PLACEMENT;  Surgeon: Donnice Brooks, MD;  Location: [facility];  Service: Urology;  Laterality: Right;   CYSTOSCOPY WITH RETROGRADE PYELOGRAM, URETEROSCOPY AND STENT PLACEMENT Right 02/12/2014   Procedure: RIGHT URETEROSCOPY ;  Surgeon: Donnice Brooks, MD;  Location: [facility];  Service: Urology;  Laterality: Right;   CYSTOSCOPY WITH URETEROSCOPY AND STENT PLACEMENT Right 03/05/2014   Procedure: CYSTOSCOPY WITH URETEROSCOPY AND STENT EXCHANGE, STONE EXTRACTION;  Surgeon: Donnice Brooks, MD;  Location: [facility];  Service: Urology;  Laterality: Right;   CYSTOSCOPY WITH URETEROSCOPY AND STENT PLACEMENT Right 03/26/2014   Procedure: CYSTO WITH RIGHT URETEROSCOPY/STENT PLACEMENT;  Surgeon: Donnice Brooks, MD;  Location[facility];  Service: Urology;  Laterality: Right;   ESOPHAGOGASTRODUODENOSCOPY N/A 11/16/2021   Procedure: ESOPHAGOGASTRODUODENOSCOPY (EGD);  Surgeon: Burnette Fallow, MD;  Location: [facility];  Service: Gastroenterology;  Laterality: N/A;   EXTRACORPOREAL SHOCK WAVE LITHOTRIPSY Right 12/17/2013   HOLMIUM LASER APPLICATION Right 03/05/2014   Procedure: HOLMIUM LASER APPLICATION;  Surgeon: Donnice Brooks, MD;  Location: [facility];  Service: Urology;  Laterality: Right;   IMPACTION REMOVAL  11/16/2021   Procedure: IMPACTION REMOVAL;  Surgeon: Burnette Fallow, MD;  Location: [facility];  Service: Gastroenterology;;   INGUINAL HERNIA REPAIR Bilateral right 2003/   left 2004   STONE EXTRACTION WITH BASKET Right 03/26/2014   Procedure: STONE EXTRACTION WITH BASKET;  Surgeon: Donnice Brooks, MD;  Location: [facility];  Service: Urology;  Laterality: Right;   TONSILLECTOMY     TOTAL KNEE ARTHROPLASTY Left 02/27/2015   UMBILICAL HERNIA REPAIR  07/19/1998   Family History  Problem Relation  Age of Onset   Hypertension Mother    Cancer Father        prostate   Cancer Sister        breast   Arthritis Other        family hx   Hyperlipidemia Other        family hx   Hypertension Other        family hx   Social History   Socioeconomic History   Marital status: Married    Spouse name: Not on file   Number of children: 1   Years of education: Not on file   Highest education level: Bachelor's degree (e.g., BA, AB, BS)  Occupational History   Not on file  Tobacco Use   Smoking status: Former    Current packs/day: 0.00    Average packs/day: 1 pack/day for 6.0 years (6.0 ttl pk-yrs)    Types: Cigarettes    Start date: 10/11/1964    Quit date: 10/12/1970    Years since quitting:  53.3   Smokeless tobacco: Former    Types: Chew  Substance and Sexual Activity   Alcohol use: Yes    Alcohol/week: 7.0 standard drinks of alcohol    Types: 7 Glasses of wine per week    Comment: never drinks more than one per day   Drug use: No   Sexual activity: Not Currently  Other Topics Concern   Not on file  Social History Narrative   Right Handed    Lives in a two story home with a basement.   Has 1 daughter   Social Drivers of Corporate investment banker Strain: Low Risk  (02/20/2024)   Overall Financial Resource Strain (CARDIA)    Difficulty of Paying Living Expenses: Not hard at all  Food Insecurity: No Food Insecurity (02/20/2024)   Hunger Vital Sign    Worried About Running Out of Food in the Last Year: Never true    Ran Out of Food in the Last Year: Never true  Transportation Needs: No Transportation Needs (02/20/2024)   PRAPARE - Administrator, Civil Service (Medical): No    Lack of Transportation (Non-Medical): No  Physical Activity: Sufficiently Active (02/20/2024)   Exercise Vital Sign    Days of Exercise per Week: 7 days    Minutes of Exercise per Session: 30 min  Stress: No Stress Concern Present (02/20/2024)   [employer] of Occupational Health -  Occupational Stress Questionnaire    Feeling of Stress: Not at all  Social Connections: Socially Integrated (02/20/2024)   Social Connection and Isolation Panel    Frequency of Communication with Friends and Family: Three times a week    Frequency of Social Gatherings with Friends and Family: Twice a week    Attends Religious Services: More than 4 times per year    Active Member of [employer] or Organizations: Yes    Attends Engineer, structural: More than 4 times per year    Marital Status: Married    Tobacco Counseling Counseling given: Not Answered    Clinical Intake:  Pre-visit preparation completed: Yes  Pain : No/denies pain     BMI - recorded: 32.37 Nutritional Status: BMI > 30  Obese Nutritional Risks: None Diabetes: No  No results found for: HGBA1C   How often do you need to have someone help you when you read instructions, pamphlets, or other written materials from your doctor or pharmacy?: 1 - Never  Interpreter Needed?: No  Information entered by :: Rojelio Blush LPN   Activities of Daily Living     02/20/2024   11:08 AM 02/13/2024    8:09 AM  In your present state of health, do you have any difficulty performing the following activities:  Hearing? 1 1  Comment Wears Hearing Aids   Vision? 0 0  Difficulty concentrating or making decisions? 0 0  Walking or climbing stairs? 0 0  Dressing or bathing? 0 0  Doing errands, shopping? 0 0  Preparing Food and eating ? N N  Using the Toilet? N N  In the past six months, have you accidently leaked urine? N N  Do you have problems with loss of bowel control? N N  Managing your Medications? N N  Managing your Finances? N N  Housekeeping or managing your Housekeeping? N N    Patient Care Team: Micheal Wolm ORN, MD as PCP - General Patel, Donika K, DO as Consulting Physician (Neurology) Kenney Clotilda PARAS, PA (Physician Assistant)  I have updated  your Care Teams any recent Medical Services you may  have received from other providers in the past year.     Assessment:   This is a routine wellness examination for [place].  Hearing/Vision screen Hearing Screening - Comments:: Wears Hearing Aids Vision Screening - Comments:: Wears rx glasses - up to date with routine eye exams with  [facility]   Goals Addressed               This Visit's Progress     Increase physical activity (pt-stated)        Get more active      Remain Active (pt-stated)         Depression Screen     02/20/2024   11:07 AM 01/04/2023    1:00 PM 05/18/2021   11:37 AM 12/05/2019    8:12 AM 11/09/2017   10:26 AM 07/13/2016    8:26 AM 07/02/2015   11:50 AM  PHQ 2/9 Scores  PHQ - 2 Score 0 0 0 0 0 0 0    Fall Risk     02/20/2024   11:10 AM 02/13/2024    8:09 AM 02/10/2023    5:47 PM 01/04/2023    2:08 PM 12/30/2022    5:25 PM  Fall Risk   Falls in the past year? 0 0 0 0 0  Number falls in past yr: 0 0  0   Injury with Fall? 0 0  0   Risk for fall due to : No Fall Risks   No Fall Risks   Follow up Falls evaluation completed   Falls evaluation completed     MEDICARE RISK AT HOME:  Medicare Risk at Home Any stairs in or around the home?: Yes If so, are there any without handrails?: No Home free of loose throw rugs in walkways, pet beds, electrical cords, etc?: Yes Adequate lighting in your home to reduce risk of falls?: Yes Life alert?: No Use of a cane, walker or w/c?: No Grab bars in the bathroom?: Yes Shower chair or bench in shower?: Yes Elevated toilet seat or a handicapped toilet?: Yes  TIMED UP AND GO:  Was the test performed?  Yes  Length of time to ambulate 10 feet: 10 sec Gait steady and fast without use of assistive device  Cognitive Function: 6CIT completed    11/09/2017   10:28 AM 07/13/2016    8:32 AM  MMSE - Mini Mental State Exam  Not completed: -- --        02/20/2024   11:10 AM  6CIT Screen  What Year? 0 points  What month? 0 points  What time? 0 points   Count back from 20 0 points  Months in reverse 0 points  Repeat phrase 0 points  Total Score 0 points    Immunizations Immunization History  Administered Date(s) Administered    sv, Bivalent, Protein Subunit Rsvpref,pf [place]) 06/29/2022   Fluad Quad(high Dose 65+) 05/10/2019, 04/22/2022   Influenza Split 06/08/2011, 04/19/2012   Influenza Whole 06/04/2009, 04/16/2010   Influenza, High Dose Seasonal PF 05/07/2016, 04/29/2017, 04/28/2018   Influenza,inj,Quad PF,6+ Mos 04/23/2013, 05/28/2014   Influenza-Unspecified 06/16/2015, 04/28/2018, 04/26/2020, 05/03/2023   Moderna Covid-19 Fall Seasonal Vaccine 52yrs & older 06/01/2022   Moderna Sars-Covid-2 Vaccination 02/12/2021   PFIZER(Purple Top)SARS-COV-2 Vaccination 08/15/2019, 09/05/2019, 04/19/2020, 06/24/2021   Pfizer Covid-19 Vaccine Bivalent Booster 76yrs & up 06/24/2021   Pneumococcal Conjugate-13 05/28/2014   Pneumococcal Polysaccharide-23 04/16/2010   Td 07/20/2007, 12/05/2019   Unspecified  SARS-COV-2 Vaccination 03/22/2023   Zoster, Live 06/16/2015    Screening Tests Health Maintenance  Topic Date Due   Zoster Vaccines- Shingrix (1 of 2) 02/11/1964   COVID-19 Vaccine (8 - Pfizer risk 2024-25 season) 09/19/2023   INFLUENZA VACCINE  02/17/2024   Medicare Annual Wellness (AWV)  02/19/2025   DTaP/Tdap/Td (3 - Tdap) 12/04/2029   Pneumococcal Vaccine: 50+ Years  Completed   Hepatitis C Screening  Completed   Hepatitis B Vaccines  Aged Out   HPV VACCINES  Aged Out   Meningococcal B Vaccine  Aged Out   Colonoscopy  Discontinued    Health Maintenance  Health Maintenance Due  Topic Date Due   Zoster Vaccines- Shingrix (1 of 2) 02/11/1964   COVID-19 Vaccine (8 - Pfizer risk 2024-25 season) 09/19/2023   INFLUENZA VACCINE  02/17/2024   Health Maintenance Items Addressed:   Additional Screening:  Vision Screening: Recommended annual ophthalmology exams for early detection of glaucoma and other disorders of the  eye. Would you like a referral to an eye doctor? No    Dental Screening: Recommended annual dental exams for proper oral hygiene  Community Resource Referral / Chronic Care Management: CRR required this visit?  No   CCM required this visit?  No   Plan:    I have personally reviewed and noted the following in the patient's chart:   Medical and social history Use of alcohol, tobacco or illicit drugs  Current medications and supplements including opioid prescriptions. Patient is not currently taking opioid prescriptions. Functional ability and status Nutritional status Physical activity Advanced directives List of other physicians Hospitalizations, surgeries, and ER visits in previous 12 months Vitals Screenings to include cognitive, depression, and falls Referrals and appointments  In addition, I have reviewed and discussed with patient certain preventive protocols, quality metrics, and best practice recommendations. A written personalized care plan for preventive services as well as general preventive health recommendations were provided to patient.   Rojelio LELON Blush, LPN   07/23/7972   After Visit Summary: (In Person-Printed) AVS printed and given to the patient  Notes: Nothing significant to report at this time.

## 2024-02-20 NOTE — Patient Instructions (Addendum)
 Mr. Diemer , Thank you for taking time out of your busy schedule to complete your Annual Wellness Visit with me. I enjoyed our conversation and look forward to speaking with you again next year. I, as well as your care team,  appreciate your ongoing commitment to your health goals. Please review the following plan we discussed and let me know if I can assist you in the future. Your Game plan/ To Do List    Referrals: If you haven't heard from the office you've been referred to, please reach out to them at the phone provided.   Follow up Visits: We will see or speak with you next year for your Next Medicare AWV with our clinical staff 02/25/25 @ 11:20a Have you seen your provider in the last 6 months (3 months if uncontrolled diabetes)? Yes 07/11/23  Clinician Recommendations:  Aim for 30 minutes of exercise or brisk walking, 6-8 glasses of water , and 5 servings of fruits and vegetables each day.       This is a list of the screenings recommended for you:  Health Maintenance  Topic Date Due   Zoster (Shingles) Vaccine (1 of 2) 02/11/1964   COVID-19 Vaccine (8 - Pfizer risk 2024-25 season) 09/19/2023   Flu Shot  02/17/2024   Medicare Annual Wellness Visit  02/19/2025   DTaP/Tdap/Td vaccine (3 - Tdap) 12/04/2029   Pneumococcal Vaccine for age over 23  Completed   Hepatitis C Screening  Completed   Hepatitis B Vaccine  Aged Out   HPV Vaccine  Aged Out   Meningitis B Vaccine  Aged Out   Colon Cancer Screening  Discontinued    Advanced directives: (Copy Requested) Please bring a copy of your health care power of attorney and living will to the office to be added to your chart at your convenience. You can mail to [facility] [address] [place], [place] [address] or email to [email or web address] Advance Care Planning is important because it:  [x]  Makes sure you receive the medical care that is consistent with your values, goals, and preferences  [x]  It provides  guidance to your family and loved ones and reduces their decisional burden about whether or not they are making the right decisions based on your wishes.  Follow the link provided in your after visit summary or read over the paperwork we have mailed to you to help you started getting your Advance Directives in place. If you need assistance in completing these, please reach out to us  so that we can help you!  See attachments for Preventive Care and Fall Prevention Tips.

## 2024-02-21 ENCOUNTER — Ambulatory Visit: Payer: Self-pay | Admitting: Family Medicine

## 2024-02-21 ENCOUNTER — Encounter: Payer: Self-pay | Admitting: Family Medicine

## 2024-02-21 ENCOUNTER — Ambulatory Visit (INDEPENDENT_AMBULATORY_CARE_PROVIDER_SITE_OTHER): Admitting: Family Medicine

## 2024-02-21 VITALS — BP 134/72 | HR 70 | Temp 97.8°F | Wt 237.5 lb

## 2024-02-21 DIAGNOSIS — I10 Essential (primary) hypertension: Secondary | ICD-10-CM

## 2024-02-21 DIAGNOSIS — E785 Hyperlipidemia, unspecified: Secondary | ICD-10-CM

## 2024-02-21 DIAGNOSIS — L820 Inflamed seborrheic keratosis: Secondary | ICD-10-CM | POA: Diagnosis not present

## 2024-02-21 LAB — COMPREHENSIVE METABOLIC PANEL WITH GFR
ALT: 17 U/L (ref 0–53)
AST: 21 U/L (ref 0–37)
Albumin: 4.4 g/dL (ref 3.5–5.2)
Alkaline Phosphatase: 65 U/L (ref 39–117)
BUN: 15 mg/dL (ref 6–23)
CO2: 28 meq/L (ref 19–32)
Calcium: 9.3 mg/dL (ref 8.4–10.5)
Chloride: 106 meq/L (ref 96–112)
Creatinine, Ser: 0.88 mg/dL (ref 0.40–1.50)
GFR: 82.06 mL/min (ref >60.00)
Glucose, Bld: 93 mg/dL (ref 70–99)
Potassium: 4.4 meq/L (ref 3.5–5.1)
Sodium: 139 meq/L (ref 135–145)
Total Bilirubin: 0.6 mg/dL (ref 0.2–1.2)
Total Protein: 7 g/dL (ref 6.0–8.3)

## 2024-02-21 LAB — CBC WITH DIFFERENTIAL/PLATELET
Basophils Absolute: 0 K/uL (ref 0.0–0.1)
Basophils Relative: 1.1 % (ref 0.0–3.0)
Eosinophils Absolute: 0.1 K/uL (ref 0.0–0.7)
Eosinophils Relative: 2.5 % (ref 0.0–5.0)
HCT: 43.6 % (ref 39.0–52.0)
Hemoglobin: 14.4 g/dL (ref 13.0–17.0)
Lymphocytes Relative: 25.9 % (ref 12.0–46.0)
Lymphs Abs: 1.1 K/uL (ref 0.7–4.0)
MCHC: 33.1 g/dL (ref 30.0–36.0)
MCV: 98.1 fl (ref 78.0–100.0)
Monocytes Absolute: 0.4 K/uL (ref 0.1–1.0)
Monocytes Relative: 9.6 % (ref 3.0–12.0)
Neutro Abs: 2.5 K/uL (ref 1.4–7.7)
Neutrophils Relative %: 60.9 % (ref 43.0–77.0)
Platelets: 220 K/uL (ref 150.0–400.0)
RBC: 4.44 Mil/uL (ref 4.22–5.81)
RDW: 13 % (ref 11.5–15.5)
WBC: 4.2 K/uL (ref 4.0–10.5)

## 2024-02-21 LAB — LIPID PANEL
Cholesterol: 142 mg/dL (ref 0–200)
HDL: 47.7 mg/dL (ref >39.00)
LDL Cholesterol: 76 mg/dL (ref 0–99)
NonHDL: 94.31
Total CHOL/HDL Ratio: 3
Triglycerides: 92 mg/dL (ref 0.0–149.0)
VLDL: 18.4 mg/dL (ref 0.0–40.0)

## 2024-02-21 MED ORDER — ATORVASTATIN CALCIUM 20 MG PO TABS: 20.0000 mg | ORAL_TABLET | Freq: Every day | ORAL | 3 refills | Status: AC

## 2024-02-21 MED ORDER — LOSARTAN POTASSIUM 100 MG PO TABS: 100.0000 mg | ORAL_TABLET | Freq: Every day | ORAL | 3 refills | Status: AC

## 2024-02-21 MED ORDER — AMLODIPINE BESYLATE 5 MG PO TABS: 5.0000 mg | ORAL_TABLET | Freq: Every day | ORAL | 3 refills | Status: AC

## 2024-02-21 NOTE — Progress Notes (Signed)
 Established Patient Office Visit  Subjective   Patient ID: Lee Smith, male    DOB: 1946/04/16  Age: 78 y.o. MRN: [id number]  Chief Complaint  Patient presents with   Medical Management of Chronic Issues    HPI   Lee Smith is seen for medical follow-up.  He has history of hypertension, aortic murmur with mild stenosis, history of colon adenomas, gout, hyperlipidemia.  Needs refills of several medications.  He takes losartan , amlodipine , and atorvastatin .  Due for follow-up labs.  Generally doing well.  We noted murmur and obtain echo last year which showed mild aortic stenosis.  He exercises fairly regularly with elliptical and some weight lifting.  No difficulties with that.  No recent dizziness or shortness of breath other than with climbing stairs.  He had some very fleeting sharp chest pain which lasted only a second or so which he thinks is nerve related pain.  No anginal symptoms.  Continues to enjoy traveling with his fifth wheel.  [place].  Irritated scaly lesion left upper chest.  Saw dermatologist and reassured that this was benign.  He would like to have this treated and is requesting this today  Past Medical History:  Diagnosis Date   Allergic rhinitis    Arthritis    bilateral knees; has planned partial knee replacement next month   Genital warts    History of kidney stones 11/16/2013   lithotripsy for right ureteral stone   Hx of colonic polyps    2008   Hyperlipidemia    Hypertension    Recurrent upper respiratory infection (URI)    Right nephrolithiasis    Past Surgical History:  Procedure Laterality Date   ADENOIDECTOMY     CHOLECYSTECTOMY  07/19/2000   CYSTOSCOPY W/ URETERAL STENT PLACEMENT Right 02/12/2014   Procedure: CYSTOSCOPY WITH RETROGRADE PYELOGRAM/URETERAL STENT PLACEMENT;  Surgeon: Lee Brooks, MD;  Location: [facility];  Service: Urology;  Laterality: Right;   CYSTOSCOPY WITH RETROGRADE PYELOGRAM,  URETEROSCOPY AND STENT PLACEMENT Right 02/12/2014   Procedure: RIGHT URETEROSCOPY ;  Surgeon: Lee Brooks, MD;  Location: [facility];  Service: Urology;  Laterality: Right;   CYSTOSCOPY WITH URETEROSCOPY AND STENT PLACEMENT Right 03/05/2014   Procedure: CYSTOSCOPY WITH URETEROSCOPY AND STENT EXCHANGE, STONE EXTRACTION;  Surgeon: Lee Brooks, MD;  Location: [facility];  Service: Urology;  Laterality: Right;   CYSTOSCOPY WITH URETEROSCOPY AND STENT PLACEMENT Right 03/26/2014   Procedure: CYSTO WITH RIGHT URETEROSCOPY/STENT PLACEMENT;  Surgeon: Lee Brooks, MD;  Location: [facility];  Service: Urology;  Laterality: Right;   ESOPHAGOGASTRODUODENOSCOPY N/A 11/16/2021   Procedure: ESOPHAGOGASTRODUODENOSCOPY (EGD);  Surgeon: Lee Fallow, MD;  Location: [facility];  Service: Gastroenterology;  Laterality: N/A;   EXTRACORPOREAL SHOCK WAVE LITHOTRIPSY Right 12/17/2013   HOLMIUM LASER APPLICATION Right 03/05/2014   Procedure: HOLMIUM LASER APPLICATION;  Surgeon: Lee Brooks, MD;  Location: [facility];  Service: Urology;  Laterality: Right;   IMPACTION REMOVAL  11/16/2021   Procedure: IMPACTION REMOVAL;  Surgeon: Lee Fallow, MD;  Location[facility];  Service: Gastroenterology;;   INGUINAL HERNIA REPAIR Bilateral right 2003/   left 2004   STONE EXTRACTION WITH BASKET Right 03/26/2014   Procedure: STONE EXTRACTION WITH BASKET;  Surgeon: Lee Brooks, MD;  Location: [facility];  Service: Urology;  Laterality: Right;   TONSILLECTOMY     TOTAL KNEE ARTHROPLASTY Left 02/27/2015   UMBILICAL HERNIA REPAIR  07/19/1998    reports that  he quit smoking about 53 years ago. His smoking use included cigarettes. He started smoking about 59 years ago. He has a 6 pack-year smoking history. He has quit using smokeless tobacco.  His smokeless tobacco use included chew. He reports current alcohol use of about 7.0  standard drinks of alcohol per week. He reports that he does not use drugs. family history includes Arthritis in an other family member; Cancer in his father and sister; Hyperlipidemia in an other family member; Hypertension in his mother and another family member. No Known Allergies  Review of Systems  Constitutional:  Negative for malaise/fatigue.  Eyes:  Negative for blurred vision.  Respiratory:  Negative for shortness of breath.   Cardiovascular:        See HPI  Neurological:  Negative for dizziness, weakness and headaches.      Objective:     BP 134/72   Pulse 70   Temp 97.8 F (36.6 C) (Oral)   Wt 237 lb 8 oz (107.7 kg)   SpO2 95%   BMI 32.21 kg/m  BP Readings from Last 3 Encounters:  02/21/24 134/72  02/20/24 128/64  06/20/23 (!) 157/85   Wt Readings from Last 3 Encounters:  02/21/24 237 lb 8 oz (107.7 kg)  02/20/24 238 lb 11.2 oz (108.3 kg)  04/28/23 233 lb (105.7 kg)      Physical Exam Vitals reviewed.  Constitutional:      General: He is not in acute distress.    Appearance: He is not ill-appearing.  Cardiovascular:     Rate and Rhythm: Normal rate and regular rhythm.     Heart sounds: Murmur heard.     Comments: 2/6 systolic ejection murmur right upper sternal border Musculoskeletal:     Comments: Trace edema lower legs bilaterally  Skin:    Comments: Well-demarcated brown scaly skin lesion about half centimeter diameter left upper chest wall.  No atypical features  Neurological:     Mental Status: He is alert.      No results found for any visits on 02/21/24.    The ASCVD Risk score (Arnett DK, et al., 2019) failed to calculate for the following reasons:   Unable to determine if patient is Non-Hispanic African American    Assessment & Plan:   Problem List Items Addressed This Visit       Unprioritized   Hyperlipidemia - Primary   Relevant Medications   amLODipine  (NORVASC ) 5 MG tablet   atorvastatin  (LIPITOR) 20 MG tablet    losartan  (COZAAR ) 100 MG tablet   Other Relevant Orders   CMP   Lipid panel   CBC with Differential/Platelet   Essential hypertension   Relevant Medications   amLODipine  (NORVASC ) 5 MG tablet   atorvastatin  (LIPITOR) 20 MG tablet   losartan  (COZAAR ) 100 MG tablet   Other Visit Diagnoses       Seborrheic keratoses, inflamed         Patient has hypertension which is stable and well-controlled.  Continue amlodipine  and losartan .  Refills provided for 1 year  Follow-up labs as above.  Refill atorvastatin  for 1 year  -Continue regular exercise habits - Continue annual flu vaccine - Patient requesting treatment of inflamed seborrheic keratosis left upper chest wall.  Discussed risk of treatment with liquid nitrogen including pain, blistering, low risk of infection and patient consented.  This was treated with liquid nitrogen without difficulty.  No follow-ups on file.    Wolm Scarlet, MD

## 2024-04-12 DIAGNOSIS — L738 Other specified follicular disorders: Secondary | ICD-10-CM | POA: Diagnosis not present

## 2024-04-12 DIAGNOSIS — D692 Other nonthrombocytopenic purpura: Secondary | ICD-10-CM | POA: Diagnosis not present

## 2024-04-12 DIAGNOSIS — D1801 Hemangioma of skin and subcutaneous tissue: Secondary | ICD-10-CM | POA: Diagnosis not present

## 2024-04-12 DIAGNOSIS — D225 Melanocytic nevi of trunk: Secondary | ICD-10-CM | POA: Diagnosis not present

## 2024-04-12 DIAGNOSIS — L812 Freckles: Secondary | ICD-10-CM | POA: Diagnosis not present

## 2024-04-12 DIAGNOSIS — L218 Other seborrheic dermatitis: Secondary | ICD-10-CM | POA: Diagnosis not present

## 2024-04-12 DIAGNOSIS — L821 Other seborrheic keratosis: Secondary | ICD-10-CM | POA: Diagnosis not present

## 2024-04-21 DIAGNOSIS — Z23 Encounter for immunization: Secondary | ICD-10-CM | POA: Diagnosis not present

## 2024-04-25 NOTE — Progress Notes (Unsigned)
 Cardiology Office Note:    Date:  04/25/2024   ID:  FINNIS COLEE, DOB August 20, 1945, MRN [id number]  PCP:  Micheal Wolm ORN, MD   [facility] Providers Cardiologist:  None     Referring MD: Micheal Wolm ORN, MD   No chief complaint on file.   History of Present Illness:    Lee Smith is a 78 y.o. male seen at the request of Dr Micheal for evaluation of heart murmur and aortic enlargement. He has a history of HLD and HTN. He states he had a murmur noted on Exam. Echo done. No chest pain, dyspnea or palpitations. No dizziness. Overall in good health and very active. Does have some mild swelling in his legs since he had knee surgery.   Past Medical History:  Diagnosis Date   Allergic rhinitis    Arthritis    bilateral knees; has planned partial knee replacement next month   Genital warts    History of kidney stones 11/16/2013   lithotripsy for right ureteral stone   Hx of colonic polyps    2008   Hyperlipidemia    Hypertension    Recurrent upper respiratory infection (URI)    Right nephrolithiasis     Past Surgical History:  Procedure Laterality Date   ADENOIDECTOMY     CHOLECYSTECTOMY  07/19/2000   CYSTOSCOPY W/ URETERAL STENT PLACEMENT Right 02/12/2014   Procedure: CYSTOSCOPY WITH RETROGRADE PYELOGRAM/URETERAL STENT PLACEMENT;  Surgeon: Donnice Brooks, MD;  Location: [facility];  Service: Urology;  Laterality: Right;   CYSTOSCOPY WITH RETROGRADE PYELOGRAM, URETEROSCOPY AND STENT PLACEMENT Right 02/12/2014   Procedure: RIGHT URETEROSCOPY ;  Surgeon: Donnice Brooks, MD;  Location: [facility];  Service: Urology;  Laterality: Right;   CYSTOSCOPY WITH URETEROSCOPY AND STENT PLACEMENT Right 03/05/2014   Procedure: CYSTOSCOPY WITH URETEROSCOPY AND STENT EXCHANGE, STONE EXTRACTION;  Surgeon: Donnice Brooks, MD;  Location: [facility];  Service: Urology;  Laterality: Right;   CYSTOSCOPY WITH URETEROSCOPY  AND STENT PLACEMENT Right 03/26/2014   Procedure: CYSTO WITH RIGHT URETEROSCOPY/STENT PLACEMENT;  Surgeon: Donnice Brooks, MD;  Location: [facility];  Service: Urology;  Laterality: Right;   ESOPHAGOGASTRODUODENOSCOPY N/A 11/16/2021   Procedure: ESOPHAGOGASTRODUODENOSCOPY (EGD);  Surgeon: Burnette Fallow, MD;  Location: [facility];  Service: Gastroenterology;  Laterality: N/A;   EXTRACORPOREAL SHOCK WAVE LITHOTRIPSY Right 12/17/2013   HOLMIUM LASER APPLICATION Right 03/05/2014   Procedure: HOLMIUM LASER APPLICATION;  Surgeon: Donnice Brooks, MD;  Location: [facility];  Service: Urology;  Laterality: Right;   IMPACTION REMOVAL  11/16/2021   Procedure: IMPACTION REMOVAL;  Surgeon: Burnette Fallow, MD;  Location: [facility];  Service: Gastroenterology;;   INGUINAL HERNIA REPAIR Bilateral right 2003/   left 2004   STONE EXTRACTION WITH BASKET Right 03/26/2014   Procedure: STONE EXTRACTION WITH BASKET;  Surgeon: Donnice Brooks, MD;  Location: [facility];  Service: Urology;  Laterality: Right;   TONSILLECTOMY     TOTAL KNEE ARTHROPLASTY Left 02/27/2015   UMBILICAL HERNIA REPAIR  07/19/1998    Current Medications: No outpatient medications have been marked as taking for the 04/27/24 encounter (Appointment) with [place], Eathen Budreau M, MD.     Allergies:   Patient has no known allergies.   Social History   Socioeconomic History   Marital status: Married    Spouse name: Not on file   Number of children: 1   Years of education: Not on file   Highest education level: Bachelor's degree (  e.g., BA, AB, BS)  Occupational History   Not on file  Tobacco Use   Smoking status: Former    Current packs/day: 0.00    Average packs/day: 1 pack/day for 6.0 years (6.0 ttl pk-yrs)    Types: Cigarettes    Start date: 10/11/1964    Quit date: 10/12/1970    Years since quitting: 53.5   Smokeless tobacco: Former    Types: Chew  Substance and Sexual Activity    Alcohol use: Yes    Alcohol/week: 7.0 standard drinks of alcohol    Types: 7 Glasses of wine per week    Comment: never drinks more than one per day   Drug use: No   Sexual activity: Not Currently  Other Topics Concern   Not on file  Social History Narrative   Right Handed    Lives in a two story home with a basement.   Has 1 daughter   Social Drivers of Corporate investment banker Strain: Low Risk  (02/20/2024)   Overall Financial Resource Strain (CARDIA)    Difficulty of Paying Living Expenses: Not hard at all  Food Insecurity: No Food Insecurity (02/20/2024)   Hunger Vital Sign    Worried About Running Out of Food in the Last Year: Never true    Ran Out of Food in the Last Year: Never true  Transportation Needs: No Transportation Needs (02/20/2024)   PRAPARE - Administrator, Civil Service (Medical): No    Lack of Transportation (Non-Medical): No  Physical Activity: Sufficiently Active (02/20/2024)   Exercise Vital Sign    Days of Exercise per Week: 7 days    Minutes of Exercise per Session: 30 min  Stress: No Stress Concern Present (02/20/2024)   [employer] of Occupational Health - Occupational Stress Questionnaire    Feeling of Stress: Not at all  Social Connections: Socially Integrated (02/20/2024)   Social Connection and Isolation Panel    Frequency of Communication with Friends and Family: Three times a week    Frequency of Social Gatherings with Friends and Family: Twice a week    Attends Religious Services: More than 4 times per year    Active Member of [employer] or Organizations: Yes    Attends Engineer, structural: More than 4 times per year    Marital Status: Married     Family History: The patient's family history includes Arthritis in an other family member; Cancer in his father and sister; Hyperlipidemia in an other family member; Hypertension in his mother and another family member.  ROS:   Please see the history of present illness.     All  other systems reviewed and are negative.  EKGs/Labs/Other Studies Reviewed:    The following studies were reviewed today: Echo 02/08/23: IMPRESSIONS     1. Left ventricular ejection fraction, by estimation, is 60 to 65%. The  left ventricle has normal function. The left ventricle has no regional  wall motion abnormalities. There is mild left ventricular hypertrophy.  Left ventricular diastolic parameters  are indeterminate.   2. Right ventricular systolic function is normal. The right ventricular  size is normal. Tricuspid regurgitation signal is inadequate for assessing  PA pressure.   3. Left atrial size was moderately dilated.   4. The mitral valve is degenerative. Trivial mitral valve regurgitation.  No evidence of mitral stenosis.   5. The aortic valve is abnormal. There is mild calcification of the  aortic valve. There is mild thickening of the  aortic valve. Aortic valve  regurgitation is not visualized. Mild aortic valve stenosis. Aortic valve  area, by VTI measures 1.25 cm. Aortic   valve mean gradient measures 12.0 mmHg. Aortic valve Vmax measures 2.34  m/s.   6. Aortic dilatation noted. There is borderline dilatation of the  ascending aorta, measuring 41 mm.   7. The inferior vena cava is normal in size with greater than 50%  respiratory variability, suggesting right atrial pressure of 3 mmHg.        Recent Labs: 02/21/2024: ALT 17; BUN 15; Creatinine, Ser 0.88; Hemoglobin 14.4; Platelets 220.0; Potassium 4.4; Sodium 139  Recent Lipid Panel    Component Value Date/Time   CHOL 142 02/21/2024 1337   TRIG 92.0 02/21/2024 1337   HDL 47.70 02/21/2024 1337   CHOLHDL 3 02/21/2024 1337   VLDL 18.4 02/21/2024 1337   LDLCALC 76 02/21/2024 1337   LDLDIRECT 166.7 04/23/2013 1055     Risk Assessment/Calculations:      No BP recorded.  {Refresh Note OR Click here to enter BP  :1}***         Physical Exam:    VS:  There were no vitals taken for this visit.    Wt  Readings from Last 3 Encounters:  02/21/24 237 lb 8 oz (107.7 kg)  02/20/24 238 lb 11.2 oz (108.3 kg)  04/28/23 233 lb (105.7 kg)     GEN:  Well nourished, well developed in no acute distress HEENT: Normal NECK: No JVD; No carotid bruits LYMPHATICS: No lymphadenopathy CARDIAC: RRR, gr 2/6 systolic murmur RUSB. Radiates to carotids.  No rubs, gallops RESPIRATORY:  Clear to auscultation without rales, wheezing or rhonchi  ABDOMEN: Soft, non-tender, non-distended MUSCULOSKELETAL:  No edema; No deformity  SKIN: Warm and dry NEUROLOGIC:  Alert and oriented x 3 PSYCHIATRIC:  Normal affect   ASSESSMENT:    No diagnosis found.  PLAN:    In order of problems listed above:  Mild aortic stenosis. Asymptomatic. Plan follow up exam in one year. Plan repeat Echo in 3 years unless a change in symptoms or exam HTN controlled.  HLD on statin LDL 70 RBBB chronic - benign           Medication Adjustments/Labs and Tests Ordered: Current medicines are reviewed at length with the patient today.  Concerns regarding medicines are outlined above.  No orders of the defined types were placed in this encounter.  No orders of the defined types were placed in this encounter.   There are no Patient Instructions on file for this visit.   Signed, Caitlyne Ingham [place], MD  04/25/2024 12:58 PM    [facility]

## 2024-04-27 ENCOUNTER — Ambulatory Visit: Attending: Cardiology | Admitting: Cardiology

## 2024-04-27 ENCOUNTER — Encounter: Payer: Self-pay | Admitting: Cardiology

## 2024-04-27 VITALS — BP 128/66 | HR 64 | Ht 72.0 in | Wt 239.0 lb

## 2024-04-27 DIAGNOSIS — I4510 Unspecified right bundle-branch block: Secondary | ICD-10-CM | POA: Insufficient documentation

## 2024-04-27 DIAGNOSIS — I350 Nonrheumatic aortic (valve) stenosis: Secondary | ICD-10-CM | POA: Insufficient documentation

## 2024-04-27 DIAGNOSIS — I10 Essential (primary) hypertension: Secondary | ICD-10-CM | POA: Diagnosis not present

## 2024-04-27 NOTE — Patient Instructions (Signed)

## 2024-05-22 DIAGNOSIS — Z23 Encounter for immunization: Secondary | ICD-10-CM | POA: Diagnosis not present

## 2024-06-21 DIAGNOSIS — N402 Nodular prostate without lower urinary tract symptoms: Secondary | ICD-10-CM | POA: Diagnosis not present

## 2024-06-28 DIAGNOSIS — R350 Frequency of micturition: Secondary | ICD-10-CM | POA: Diagnosis not present

## 2024-06-28 DIAGNOSIS — R972 Elevated prostate specific antigen [PSA]: Secondary | ICD-10-CM | POA: Diagnosis not present

## 2024-06-28 DIAGNOSIS — N401 Enlarged prostate with lower urinary tract symptoms: Secondary | ICD-10-CM | POA: Diagnosis not present

## 2024-06-28 DIAGNOSIS — N209 Urinary calculus, unspecified: Secondary | ICD-10-CM | POA: Diagnosis not present

## 2024-06-29 ENCOUNTER — Other Ambulatory Visit: Payer: Self-pay

## 2024-06-29 DIAGNOSIS — R972 Elevated prostate specific antigen [PSA]: Secondary | ICD-10-CM

## 2024-07-17 ENCOUNTER — Inpatient Hospital Stay: Admission: RE | Admit: 2024-07-17 | Discharge: 2024-07-17 | Disposition: A | Source: Ambulatory Visit

## 2024-07-17 DIAGNOSIS — R972 Elevated prostate specific antigen [PSA]: Secondary | ICD-10-CM

## 2024-07-17 MED ORDER — GADOPICLENOL 0.5 MMOL/ML IV SOLN
10.0000 mL | Freq: Once | INTRAVENOUS | Status: AC | PRN
  Administered 2024-07-17: 10 mL via INTRAVENOUS

## 2024-07-25 ENCOUNTER — Ambulatory Visit: Admitting: Family Medicine

## 2024-07-25 ENCOUNTER — Encounter: Payer: Self-pay | Admitting: Family Medicine

## 2024-07-25 VITALS — BP 140/74 | HR 69 | Temp 97.8°F | Wt 237.6 lb

## 2024-07-25 DIAGNOSIS — R062 Wheezing: Secondary | ICD-10-CM

## 2024-07-25 DIAGNOSIS — E785 Hyperlipidemia, unspecified: Secondary | ICD-10-CM | POA: Diagnosis not present

## 2024-07-25 DIAGNOSIS — I10 Essential (primary) hypertension: Secondary | ICD-10-CM | POA: Diagnosis not present

## 2024-07-25 DIAGNOSIS — J3089 Other allergic rhinitis: Secondary | ICD-10-CM | POA: Diagnosis not present

## 2024-07-25 MED ORDER — ALBUTEROL SULFATE HFA 108 (90 BASE) MCG/ACT IN AERS: 1.0000 | INHALATION_SPRAY | Freq: Four times a day (QID) | RESPIRATORY_TRACT | 1 refills | Status: AC | PRN

## 2024-07-25 MED ORDER — MONTELUKAST SODIUM 10 MG PO TABS: 10.0000 mg | ORAL_TABLET | Freq: Every day | ORAL | 3 refills | Status: AC

## 2024-07-25 MED ORDER — PREDNISONE 20 MG PO TABS: ORAL_TABLET | ORAL | 0 refills | Status: AC

## 2024-07-25 NOTE — Progress Notes (Signed)
 "  Established Patient Office Visit  Subjective   Patient ID: Lee Smith, male    DOB: 09/14/1945  Age: 79 y.o. MRN: [id number]  Chief Complaint  Patient presents with   Cough   Nasal Congestion    HPI   Lee Smith is seen for chronic medical follow-up and management.  He has history of hypertension, chronic perennial allergic rhinitis, heart murmur with mild aortic stenosis, hyperlipidemia, intermittent wheezing.  He feels like he is having some prolonged allergy  symptoms.  Has been wheezing frequently at night.  Uses rescue inhaler with albuterol  as needed.  No recent fever.  At baseline, takes Zyrtec  and Flonase but still has some frequent postnasal drip and nasal congestive symptoms.  Has some mild dyspnea with extreme exertion but no chest pains.  No recent syncope.  No significant dizziness.  Elevated PSA followed by urology.  He has known BPH and is on tamsulosin .  Had recent MRI prostate and is waiting to hear back from urologist regarding that.  He and his wife are preparing to leave for [place]  for several months.  His blood pressure is treated with amlodipine  and losartan  and has been stable by home readings.  Past Medical History:  Diagnosis Date   Allergic rhinitis    Arthritis    bilateral knees; has planned partial knee replacement next month   Genital warts    History of kidney stones 11/16/2013   lithotripsy for right ureteral stone   Hx of colonic polyps    2008   Hyperlipidemia    Hypertension    Recurrent upper respiratory infection (URI)    Right nephrolithiasis    Past Surgical History:  Procedure Laterality Date   ADENOIDECTOMY     CHOLECYSTECTOMY  07/19/2000   CYSTOSCOPY W/ URETERAL STENT PLACEMENT Right 02/12/2014   Procedure: CYSTOSCOPY WITH RETROGRADE PYELOGRAM/URETERAL STENT PLACEMENT;  Surgeon: Donnice Brooks, MD;  Location: [facility];  Service: Urology;  Laterality: Right;   CYSTOSCOPY WITH RETROGRADE PYELOGRAM,  URETEROSCOPY AND STENT PLACEMENT Right 02/12/2014   Procedure: RIGHT URETEROSCOPY ;  Surgeon: Donnice Brooks, MD;  Location: [facility];  Service: Urology;  Laterality: Right;   CYSTOSCOPY WITH URETEROSCOPY AND STENT PLACEMENT Right 03/05/2014   Procedure: CYSTOSCOPY WITH URETEROSCOPY AND STENT EXCHANGE, STONE EXTRACTION;  Surgeon: Donnice Brooks, MD;  Location: [facility];  Service: Urology;  Laterality: Right;   CYSTOSCOPY WITH URETEROSCOPY AND STENT PLACEMENT Right 03/26/2014   Procedure: CYSTO WITH RIGHT URETEROSCOPY/STENT PLACEMENT;  Surgeon: Donnice Brooks, MD;  Location: [facility];  Service: Urology;  Laterality: Right;   ESOPHAGOGASTRODUODENOSCOPY N/A 11/16/2021   Procedure: ESOPHAGOGASTRODUODENOSCOPY (EGD);  Surgeon: Burnette Fallow, MD;  Location: [facility];  Service: Gastroenterology;  Laterality: N/A;   EXTRACORPOREAL SHOCK WAVE LITHOTRIPSY Right 12/17/2013   HOLMIUM LASER APPLICATION Right 03/05/2014   Procedure: HOLMIUM LASER APPLICATION;  Surgeon: Donnice Brooks, MD;  Location[facility];  Service: Urology;  Laterality: Right;   IMPACTION REMOVAL  11/16/2021   Procedure: IMPACTION REMOVAL;  Surgeon: Burnette Fallow, MD;  Location: [facility];  Service: Gastroenterology;;   INGUINAL HERNIA REPAIR Bilateral right 2003/   left 2004   STONE EXTRACTION WITH BASKET Right 03/26/2014   Procedure: STONE EXTRACTION WITH BASKET;  Surgeon: Donnice Brooks, MD;  Location: [facility];  Service: Urology;  Laterality: Right;   TONSILLECTOMY     TOTAL KNEE ARTHROPLASTY Left 02/27/2015   UMBILICAL HERNIA REPAIR  07/19/1998    reports that he  quit smoking about 53 years ago. His smoking use included cigarettes. He started smoking about 59 years ago. He has a 6 pack-year smoking history. He has quit using smokeless tobacco.  His smokeless tobacco use included chew. He reports current alcohol use of about 7.0  standard drinks of alcohol per week. He reports that he does not use drugs. family history includes Arthritis in an other family member; Cancer in his father and sister; Hyperlipidemia in an other family member; Hypertension in his mother and another family member. Allergies[1]    Review of Systems  Constitutional:  Negative for malaise/fatigue.  HENT:  Positive for congestion and hearing loss. Negative for sinus pain.   Eyes:  Negative for blurred vision.  Respiratory:  Negative for shortness of breath.   Cardiovascular:  Negative for chest pain.  Neurological:  Negative for dizziness, weakness and headaches.      Objective:     BP (!) 140/74   Pulse 69   Temp 97.8 F (36.6 C) (Oral)   Wt 237 lb 9.6 oz (107.8 kg)   SpO2 94%   BMI 32.22 kg/m  BP Readings from Last 3 Encounters:  07/25/24 (!) 140/74  04/27/24 128/66  02/21/24 134/72   Wt Readings from Last 3 Encounters:  07/25/24 237 lb 9.6 oz (107.8 kg)  04/27/24 239 lb (108.4 kg)  02/21/24 237 lb 8 oz (107.7 kg)      Physical Exam Vitals reviewed.  Constitutional:      Appearance: He is well-developed.  Eyes:     Pupils: Pupils are equal, round, and reactive to light.  Neck:     Thyroid : No thyromegaly.  Cardiovascular:     Rate and Rhythm: Normal rate and regular rhythm.     Heart sounds: Murmur heard.     Comments: 2 or 6 systolic ejection murmur right upper sternal border and heard along the left sternal border as well. Pulmonary:     Effort: Pulmonary effort is normal. No respiratory distress.     Breath sounds: Normal breath sounds. No wheezing or rales.  Musculoskeletal:     Cervical back: Neck supple.  Neurological:     Mental Status: He is alert and oriented to person, place, and time.      No results found for any visits on 07/25/24.  Last CBC Lab Results  Component Value Date   WBC 4.2 02/21/2024   HGB 14.4 02/21/2024   HCT 43.6 02/21/2024   MCV 98.1 02/21/2024   MCH 32.7 11/22/2013    RDW 13.0 02/21/2024   PLT 220.0 02/21/2024   Last metabolic panel Lab Results  Component Value Date   GLUCOSE 93 02/21/2024   NA 139 02/21/2024   K 4.4 02/21/2024   CL 106 02/21/2024   CO2 28 02/21/2024   BUN 15 02/21/2024   CREATININE 0.88 02/21/2024   GFR 82.06 02/21/2024   CALCIUM  9.3 02/21/2024   PROT 7.0 02/21/2024   ALBUMIN 4.4 02/21/2024   BILITOT 0.6 02/21/2024   ALKPHOS 65 02/21/2024   AST 21 02/21/2024   ALT 17 02/21/2024   Last lipids Lab Results  Component Value Date   CHOL 142 02/21/2024   HDL 47.70 02/21/2024   LDLCALC 76 02/21/2024   LDLDIRECT 166.7 04/23/2013   TRIG 92.0 02/21/2024   CHOLHDL 3 02/21/2024      The ASCVD Risk score (Arnett DK, et al., 2019) failed to calculate for the following reasons:   Unable to determine if patient is Non-Hispanic African American  Assessment & Plan:   #1 perennial allergic rhinitis.  Still having symptoms in spite of Zyrtec  and Flonase.  We discussed adding trial of Singulair  10 mg once daily.  Patient also requesting brief prednisone  burst which has controlled his symptoms temporarily in the past.  He would like to do this before leaving for [place] .  We agreed to send in prednisone  20 mg 2 tablets daily for 5 days.  Going to start the Singulair .  Be in touch with any persistent symptoms  #2 hypertension.  Slightly up today but generally well-controlled by home readings.  Continue amlodipine  and losartan .  Recent electrolytes stable.  #3 hyperlipidemia treated with atorvastatin .  Recent LDL cholesterol 76.  Continue low saturated fat diet.  #4 frequent wheezing.  Probably related #1.  Be in touch if he is not getting adequate control with Singulair .  Could consider trial of long-acting bronchodilator perhaps in combination with steroid  #5 aortic murmur with mild stenosis by echo 2024.  Followed by cardiology.  No concerning symptoms.   Wolm Scarlet, MD     [1] No Known Allergies  "

## 2024-12-17 ENCOUNTER — Ambulatory Visit

## 2025-02-25 ENCOUNTER — Ambulatory Visit
